# Patient Record
Sex: Male | Born: 1980 | Race: Black or African American | Hispanic: No | Marital: Married | Smoking: Never smoker
Health system: Southern US, Community
[De-identification: ages and names within clinical notes are randomized; demographics above are authoritative.]

## PROBLEM LIST (undated history)

## (undated) DIAGNOSIS — K529 Noninfective gastroenteritis and colitis, unspecified: Secondary | ICD-10-CM

## (undated) DIAGNOSIS — I509 Heart failure, unspecified: Secondary | ICD-10-CM

## (undated) DIAGNOSIS — E119 Type 2 diabetes mellitus without complications: Secondary | ICD-10-CM

## (undated) DIAGNOSIS — E785 Hyperlipidemia, unspecified: Secondary | ICD-10-CM

## (undated) DIAGNOSIS — G4733 Obstructive sleep apnea (adult) (pediatric): Secondary | ICD-10-CM

## (undated) DIAGNOSIS — I10 Essential (primary) hypertension: Secondary | ICD-10-CM

## (undated) HISTORY — DX: Obstructive sleep apnea (adult) (pediatric): G47.33

## (undated) HISTORY — DX: Heart failure, unspecified: I50.9

## (undated) HISTORY — PX: OTHER SURGICAL HISTORY: SHX169

---

## 2015-08-28 ENCOUNTER — Encounter: Payer: Self-pay | Admitting: *Deleted

## 2015-08-28 ENCOUNTER — Ambulatory Visit
Admission: EM | Admit: 2015-08-28 | Discharge: 2015-08-28 | Disposition: A | Discharge status: Home / Self Care | Payer: Federal, State, Local not specified - PPO | Attending: Family Medicine | Admitting: Family Medicine

## 2015-08-28 DIAGNOSIS — IMO0001 Reserved for inherently not codable concepts without codable children: Secondary | ICD-10-CM

## 2015-08-28 DIAGNOSIS — L03011 Cellulitis of right finger: Secondary | ICD-10-CM

## 2015-08-28 HISTORY — DX: Type 2 diabetes mellitus without complications: E11.9

## 2015-08-28 HISTORY — DX: Essential (primary) hypertension: I10

## 2015-08-28 MED ORDER — SULFAMETHOXAZOLE-TRIMETHOPRIM 800-160 MG PO TABS: 1.0000 | ORAL_TABLET | Freq: Two times a day (BID) | ORAL | Status: DC

## 2015-08-28 MED ORDER — HYDROCODONE-ACETAMINOPHEN 5-325 MG PO TABS: 1.0000 | ORAL_TABLET | Freq: Three times a day (TID) | ORAL | Status: DC | PRN

## 2015-08-28 MED ORDER — MUPIROCIN 2 % EX OINT: 1.0000 "application " | TOPICAL_OINTMENT | Freq: Three times a day (TID) | CUTANEOUS | Status: DC

## 2015-08-28 NOTE — ED Notes (Signed)
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 Finger is swollen

## 2015-08-28 NOTE — Discharge Instructions (Signed)
Paronychia  Paronychia is an infection of the skin. It happens near a fingernail or toenail. It may cause pain and swelling around the nail. Usually, it is not serious and it clears up with treatment. HOME CARE  Soak the fingers or toes in warm water as told by your doctor. You may be told to do this for 20 minutes, 2-3 times a day.  Keep the area dry when you are not soaking it.  Take medicines only as told by your doctor.  If you were given an antibiotic medicine, finish all of it even if you start to feel better.  Keep the affected area clean.  Do not try to drain a fluid-filled bump yourself.  Wear rubber gloves when putting your hands in water.  Wear gloves if your hands might touch cleaners or chemicals.  Follow your doctor's instructions about:  Wound care.  Bandage (dressing) changes and removal. GET HELP IF:  Your symptoms get worse or do not improve.  You have a fever or chills.  You have redness spreading from the affected area.  You have more fluid, blood, or pus coming from the affected area.  Your finger or knuckle is swollen or is hard to move.   This information is not intended to replace advice given to you by your health care provider. Make sure you discuss any questions you have with your health care provider.   Document Released: 10/24/2009 Document Revised: 03/22/2015 Document Reviewed: 10/13/2014 Elsevier Interactive Patient Education [employer].

## 2015-08-28 NOTE — ED Provider Notes (Signed)
CSN: [id number]     Arrival date & time 08/28/15  1012 History   First MD Initiated Contact with Patient 08/28/15 1202     Nurses's notes were reviewed. Chief Complaint  Patient presents with  . Hand Pain     Patient second finger of the right hand became painful after going to the shooting range on Tuesday. States the fingers become worse  as the days have progressed. 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 And no history of infection of the hand or fingers previously.   Consider location/radiation/quality/duration/timing/severity/associated sxs/prior Treatment) Patient is a 33 y.o. male presenting with hand pain. The history is provided by the patient. No language interpreter was used.  Hand Pain This is a new problem. The current episode started more than 2 days ago. The problem occurs constantly. The problem has been gradually worsening. Pertinent negatives include no chest pain, no abdominal pain, no headaches and no shortness of breath. Exacerbated by: Using the finger. Nothing relieves the symptoms.    Past Medical History  Diagnosis Date  . Diabetes mellitus without complication ([place])   . Hypertension    History reviewed. No pertinent past surgical history. History reviewed. No pertinent family history. Social History  Substance Use Topics  . Smoking status: Never Smoker   . Smokeless tobacco: None  . Alcohol Use: 3.6 oz/week    6 Cans of beer per week    Review of Systems  Respiratory: Negative for shortness of breath.   Cardiovascular: Negative for chest pain.  Gastrointestinal: Negative for abdominal pain.  Neurological: Negative for headaches.  All other systems reviewed and are negative.    Patient does not smoke but he is a diabetic and has hypertension.  Allergies  Fish allergy  Home Medications   Prior to Admission medications   Medication Sig Start Date End Date Taking? Authorizing Provider    HYDROcodone-acetaminophen (NORCO) 5-325 MG tablet Take 1 tablet by mouth every 8 (eight) hours as needed for moderate pain. 08/28/15   Frederich Cha, MD  mupirocin ointment (BACTROBAN) 2 % Apply 1 application topically 3 (three) times daily. 08/28/15   Frederich Cha, MD  sulfamethoxazole-trimethoprim (BACTRIM DS,SEPTRA DS) 800-160 MG tablet Take 1 tablet by mouth 2 (two) times daily. 08/28/15   Frederich Cha, MD   Meds Ordered and Administered this Visit  Medications - No data to display  BP 147/95 mmHg  Pulse 75  Temp(Src) 97.9 F (36.6 C) (Oral)  Ht 5' 9"  (1.753 m)  Wt 348 lb (157.852 kg)  BMI 51.37 kg/m2  SpO2 97% No data found.   Physical Exam  Constitutional: He is oriented to person, place, and time. He appears well-developed and well-nourished.  HENT:  Head: Normocephalic and atraumatic.  Eyes: Pupils are equal, round, and reactive to light.  Neck: Neck supple.  Musculoskeletal: Normal range of motion.       Hands: Neurological: He is alert and oriented to person, place, and time.  Skin: Skin is warm and dry.  Psychiatric: His behavior is normal.  Vitals reviewed.  he has a paronychial infection of the right second finger.  ED Course  .Marland KitchenIncision and Drainage Date/Time: 08/28/2015 12:27 PM Performed by: Frederich Cha Authorized by: Frederich Cha Consent: Verbal consent obtained. Written consent obtained. Consent given by: patient Patient identity confirmed: verbally with patient Type: abscess Body area: upper extremity Location details: right index finger Anesthesia: local infiltration Local anesthetic: lidocaine 1% without epinephrine Patient sedated:  no Scalpel size: 11 Incision type: single straight Incision depth: subcutaneous Complexity: simple Drainage: purulent Drainage amount: moderate Wound treatment: wound left open Patient tolerance: Patient tolerated the procedure well with no immediate complications   (including critical care time)  Labs Review Labs  Reviewed  CULTURE, ROUTINE-ABSCESS    Imaging Review No results found.   Visual Acuity Review  Right Eye Distance:   Left Eye Distance:   Bilateral Distance:    Right Eye Near:   Left Eye Near:    Bilateral Near:         MDM   1. Paronychia of second finger, right      patient has improvement after the procedure. We'll bandage the finger place on Septra DS twice a day for 10 days back pain ointment up to 3 times a day Norco for pain as needed. Return for follow-up when necessary. Will allow him to return to work on Tuesday  Frederich Cha, MD 08/28/15 1229

## 2015-08-31 LAB — CULTURE, ROUTINE-ABSCESS

## 2016-01-20 ENCOUNTER — Ambulatory Visit
Admission: EM | Admit: 2016-01-20 | Discharge: 2016-01-20 | Disposition: A | Discharge status: Home / Self Care | Payer: Federal, State, Local not specified - PPO | Attending: Family Medicine | Admitting: Family Medicine

## 2016-01-20 ENCOUNTER — Ambulatory Visit (INDEPENDENT_AMBULATORY_CARE_PROVIDER_SITE_OTHER): Payer: Federal, State, Local not specified - PPO

## 2016-01-20 DIAGNOSIS — M5416 Radiculopathy, lumbar region: Secondary | ICD-10-CM | POA: Diagnosis not present

## 2016-01-20 HISTORY — DX: Noninfective gastroenteritis and colitis, unspecified: K52.9

## 2016-01-20 MED ORDER — NAPROXEN 500 MG PO TABS: 500.0000 mg | ORAL_TABLET | Freq: Two times a day (BID) | ORAL | Status: DC

## 2016-01-20 MED ORDER — METAXALONE 800 MG PO TABS: 800.0000 mg | ORAL_TABLET | Freq: Three times a day (TID) | ORAL | Status: DC

## 2016-01-20 MED ORDER — KETOROLAC TROMETHAMINE 60 MG/2ML IM SOLN
60.0000 mg | Freq: Once | INTRAMUSCULAR | Status: AC
  Administered 2016-01-20: 60 mg via INTRAMUSCULAR

## 2016-01-20 NOTE — ED Notes (Addendum)
Patient c/o right-sided lower back pain going down his right leg which has been happening for 2 weeks now.  His pain is worse in the morning.  He does not remember the names of any of his medications today, but he does take medication for Diabetes and High Blood Pressure.

## 2016-01-20 NOTE — ED Provider Notes (Signed)
CSN: [id number]     Arrival date & time 01/20/16  6578 History   First MD Initiated Contact with Patient 01/20/16 [phone]     Chief Complaint  Patient presents with  . Back Pain    Right Sided Lower Back Pain going down leg   (Consider location/radiation/quality/duration/timing/severity/associated sxs/prior Treatment) HPI  This a 35 year old male who presents with a two-week history of nonradiating low back pain that it's in his right sided lower lumbar region into his right buttocks posterior thigh and lateral calf. Is mostly pain that he feels and there is no numbness or tingling associated. He does not remember any specific injury but does remember sleeping in a very thin mattress 2 weeks ago and awoke with the onset of this pain. Has persisted despite the use of over-the-counter medications including ibuprofen BenGay and icy hot. He states that his most painful when he is straining at bowel movement but coughing or sneezing does not seem to exacerbate it. He has had previous back pains in the past but nothing this severe.  Past Medical History  Diagnosis Date  . Diabetes mellitus without complication ([place])   . Hypertension   . Colitis    History reviewed. No pertinent past surgical history. Family History  Problem Relation Age of Onset  . Hypertension Mother    Social History  Substance Use Topics  . Smoking status: Never Smoker   . Smokeless tobacco: None  . Alcohol Use: 3.6 oz/week    6 Cans of beer per week    Review of Systems  Constitutional: Negative for fever, chills, activity change and fatigue.  HENT: Negative for congestion.   All other systems reviewed and are negative.   Allergies  Fish allergy  Home Medications   Prior to Admission medications   Medication Sig Start Date End Date Taking? Authorizing Provider  HYDROcodone-acetaminophen (NORCO) 5-325 MG tablet Take 1 tablet by mouth every 8 (eight) hours as needed for moderate pain. 08/28/15   Frederich Cha, MD   metaxalone (SKELAXIN) 800 MG tablet Take 1 tablet (800 mg total) by mouth 3 (three) times daily. 01/20/16   Lorin Picket, PA-C  mupirocin ointment (BACTROBAN) 2 % Apply 1 application topically 3 (three) times daily. 08/28/15   Frederich Cha, MD  naproxen (NAPROSYN) 500 MG tablet Take 1 tablet (500 mg total) by mouth 2 (two) times daily with a meal. 01/20/16   Lorin Picket, PA-C  sulfamethoxazole-trimethoprim (BACTRIM DS,SEPTRA DS) 800-160 MG tablet Take 1 tablet by mouth 2 (two) times daily. 08/28/15   Frederich Cha, MD   Meds Ordered and Administered this Visit   Medications  ketorolac (TORADOL) injection 60 mg (60 mg Intramuscular Given 01/20/16 1002)    BP 150/104 mmHg  Pulse 74  Temp(Src) 98 F (36.7 C) (Oral)  Resp 17  Ht 5' 9"  (1.753 m)  Wt 345 lb (156.491 kg)  BMI 50.92 kg/m2  SpO2 99% No data found.   Physical Exam  Constitutional: He appears well-developed and well-nourished. No distress.  HENT:  Head: Normocephalic and atraumatic.  Eyes: Conjunctivae are normal. Pupils are equal, round, and reactive to light.  Neck: Normal range of motion. Neck supple.  Musculoskeletal:  Examination of the lumbar spine is the patient able to forward flex with a definite right sided takeoff. Is able to bend to the level of his hands at his ankle level. Right lateral flexion is decreased and painful for the patient. Is able to toe and heel walk without difficulty. Deep  tendon reflexes are 1- 2 over 4 and bilaterally symmetrical. EHL anterior tibialis and peroneal are strong to clinical testing. Her leg raise testing is positive on the right at approximately 75-80 with low back pain and lateral calf pain. Cross positive component on the left at 80 with the low back pain.  Skin: He is not diaphoretic.  Vitals reviewed.   ED Course  Procedures (including critical care time)  Labs Review Labs Reviewed - No data to display  Imaging Review Dg Lumbar Spine Complete  01/20/2016  CLINICAL  DATA:  Lumbago with right-sided radicular symptoms. No history of trauma EXAM: LUMBAR SPINE - COMPLETE 4+ VIEW COMPARISON:  None. FINDINGS: Frontal, lateral, spot lumbosacral lateral, and bilateral oblique views were obtained. There are 5 non-rib-bearing lumbar type vertebral bodies. There is no fracture or spondylolisthesis. There is mild disc space narrowing at L4-5 and L5-S1. Other disc spaces appear unremarkable. There is no appreciable facet arthropathy. IMPRESSION: Mild disc space narrowing L4-5 and L5-S1. No fracture or spondylolisthesis. Electronically Signed   By: Lowella Grip III M.D.   On: 01/20/2016 10:38     Visual Acuity Review  Right Eye Distance:   Left Eye Distance:   Bilateral Distance:    Right Eye Near:   Left Eye Near:    Bilateral Near:     10:02 Medication Given MA  ketorolac (TORADOL) injection 60 mg - Dose: 60 mg ; Route: Intramuscular ; Site: Right Ventrogluteal ; Scheduled Time: 10         MDM   1. Right lumbar radiculopathy    Discharge Medication List as of 01/20/2016 11:11 AM    START taking these medications   Details  metaxalone (SKELAXIN) 800 MG tablet Take 1 tablet (800 mg total) by mouth 3 (three) times daily., Starting 01/20/2016, Until Discontinued, Normal    naproxen (NAPROSYN) 500 MG tablet Take 1 tablet (500 mg total) by mouth 2 (two) times daily with a meal., Starting 01/20/2016, Until Discontinued, Normal      Plan: 1. Test/x-ray results and diagnosis reviewed with patient 2. rx as per orders; risks, benefits, potential side effects reviewed with patient 3. Recommend supportive treatment with symptom avoidance and rest as necessary he is cautioned regarding the use of Skelaxin with activities require concentration and judgment. He is not improving in a week or 2 recommended that he see his, primary care physician for possible enrollment in a therapy program.  4. F/u prn if symptoms worsen or don't improve     Lorin Picket,  PA-C 01/20/16 1445

## 2016-01-20 NOTE — Discharge Instructions (Signed)
Lumbosacral Radiculopathy Lumbosacral radiculopathy is a condition that involves the spinal nerves and nerve roots in the low back and bottom of the spine. The condition develops when these nerves and nerve roots move out of place or become inflamed and cause symptoms. CAUSES This condition may be caused by:  Pressure from a disk that bulges out of place (herniated disk). A disk is a plate of cartilage that separates bones in the spine.  Disk degeneration.  A narrowing of the bones of the lower back (spinal stenosis).  A tumor.  An infection.  An injury that places sudden pressure on the disks that cushion the bones of your lower spine. RISK FACTORS This condition is more likely to develop in:  Males aged 30-50 years.  Females aged 33-60 years.  People who lift improperly.  People who are overweight or live a sedentary lifestyle.  People who smoke.  People who perform repetitive activities that strain the spine. SYMPTOMS Symptoms of this condition include:  Pain that goes down from the back into the legs (sciatica). This is the most common symptom. The pain may be worse with sitting, coughing, or sneezing.  Pain and numbness in the arms and legs.  Muscle weakness.  Tingling.  Loss of bladder control or bowel control. DIAGNOSIS This condition is diagnosed with a physical exam and medical history. If the pain is lasting, you may have tests, such as:  MRI scan.  X-ray.  CT scan.  Myelogram.  Nerve conduction study. TREATMENT This condition is often treated with:  Hot packs and ice applied to affected areas.  Stretches to improve flexibility.  Exercises to strengthen back muscles.  Physical therapy.  Pain medicine.  A steroid injection in the spine. In some cases, no treatment is needed. If the condition is long-lasting (chronic), or if symptoms are severe, treatment may involve surgery or lifestyle changes, such as following a weight loss plan. HOME  CARE INSTRUCTIONS Medicines  Take medicines only as directed by your health care provider.  Do not drive or operate heavy machinery while taking pain medicine. Injury Care  Apply a heat pack to the injured area as directed by your health care provider.  Apply ice to the affected area:  Put ice in a plastic bag.  Place a towel between your skin and the bag.  Leave the ice on for 20-30 minutes, every 2 hours while you are awake or as needed. Or, leave the ice on for as long as directed by your health care provider. Other Instructions  If you were shown how to do any exercises or stretches, do them as directed by your health care provider.  If your health care provider prescribed a diet or exercise program, follow it as directed.  Keep all follow-up visits as directed by your health care provider. This is important. SEEK MEDICAL CARE IF:  Your pain does not improve over time even when taking pain medicines. SEEK IMMEDIATE MEDICAL CARE IF:  Your develop severe pain.  Your pain suddenly gets worse.  You develop increasing weakness in your legs.  You lose the ability to control your bladder or bowel.  You have difficulty walking or balancing.  You have a fever.   This information is not intended to replace advice given to you by your health care provider. Make sure you discuss any questions you have with your health care provider.   Document Released: 11/05/2005 Document Revised: 03/22/2015 Document Reviewed: 11/01/2014 Elsevier Interactive Patient Education [employer].

## 2016-01-26 ENCOUNTER — Ambulatory Visit
Admission: RE | Admit: 2016-01-26 | Discharge: 2016-01-26 | Disposition: A | Discharge status: Home / Self Care | Payer: Federal, State, Local not specified - PPO | Source: Ambulatory Visit | Attending: Physician Assistant | Admitting: Physician Assistant

## 2016-01-26 ENCOUNTER — Other Ambulatory Visit: Payer: Self-pay | Admitting: Physician Assistant

## 2016-01-26 DIAGNOSIS — M5136 Other intervertebral disc degeneration, lumbar region: Secondary | ICD-10-CM | POA: Insufficient documentation

## 2016-01-26 DIAGNOSIS — M5116 Intervertebral disc disorders with radiculopathy, lumbar region: Secondary | ICD-10-CM | POA: Insufficient documentation

## 2017-02-08 IMAGING — CR DG LUMBAR SPINE COMPLETE 4+V
6 series · 6 of 6 positions shown · non-contrast
Comparison: None.

CLINICAL DATA: Lumbago with right-sided radicular symptoms. No
history of trauma

EXAM:
LUMBAR SPINE - COMPLETE 4+ VIEW

[l-spine ap]
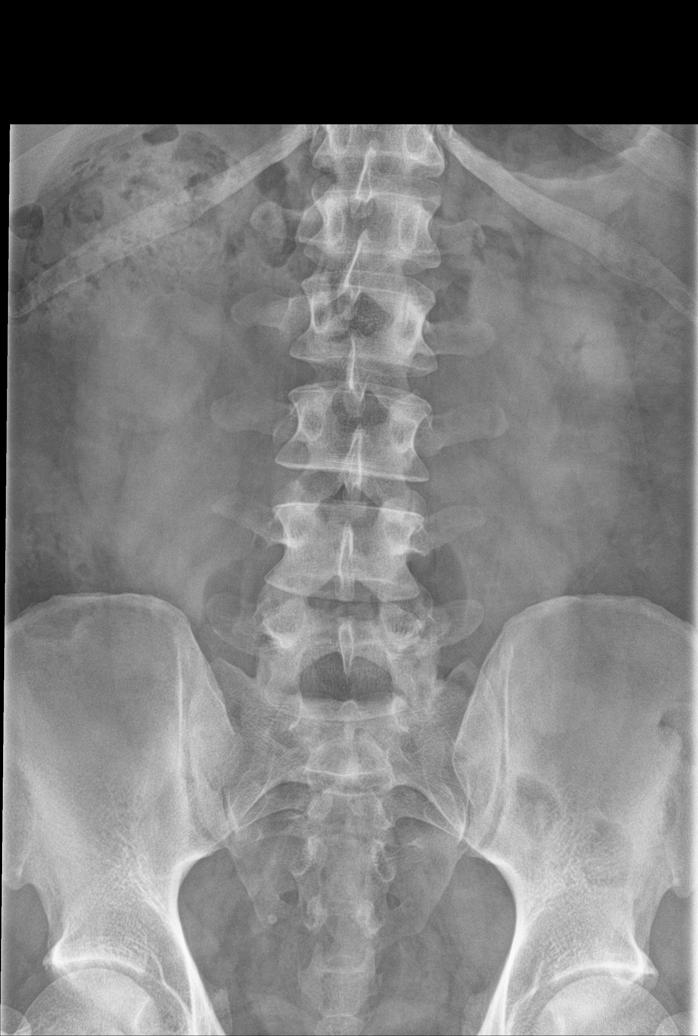

[l-spine obl (1 of 2)]
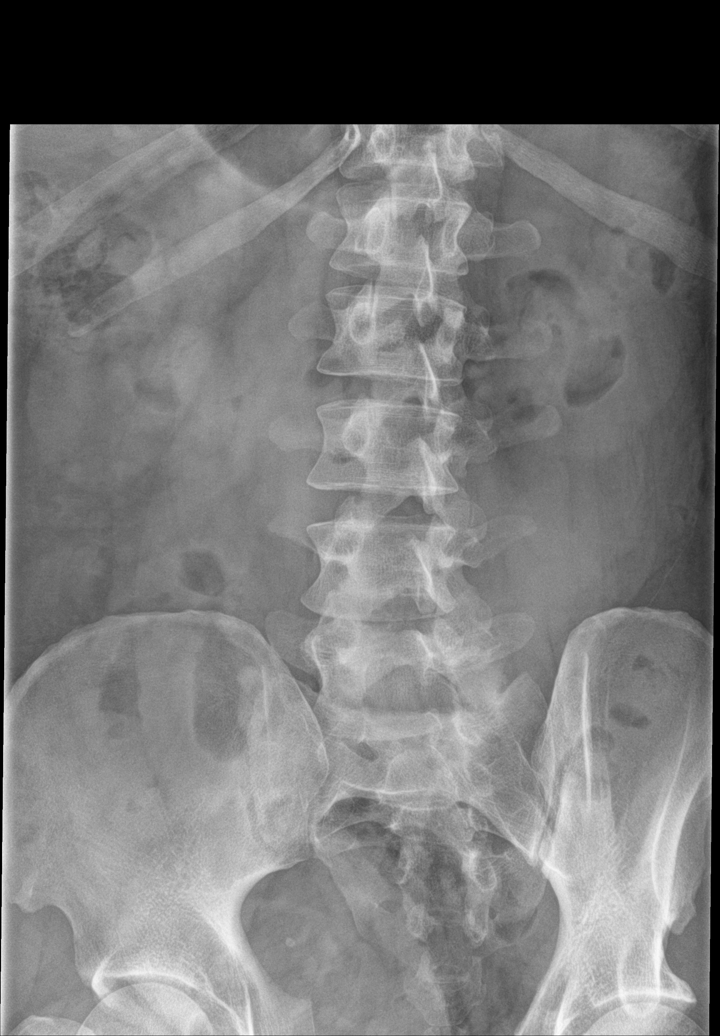

[l-spine obl (2 of 2)]
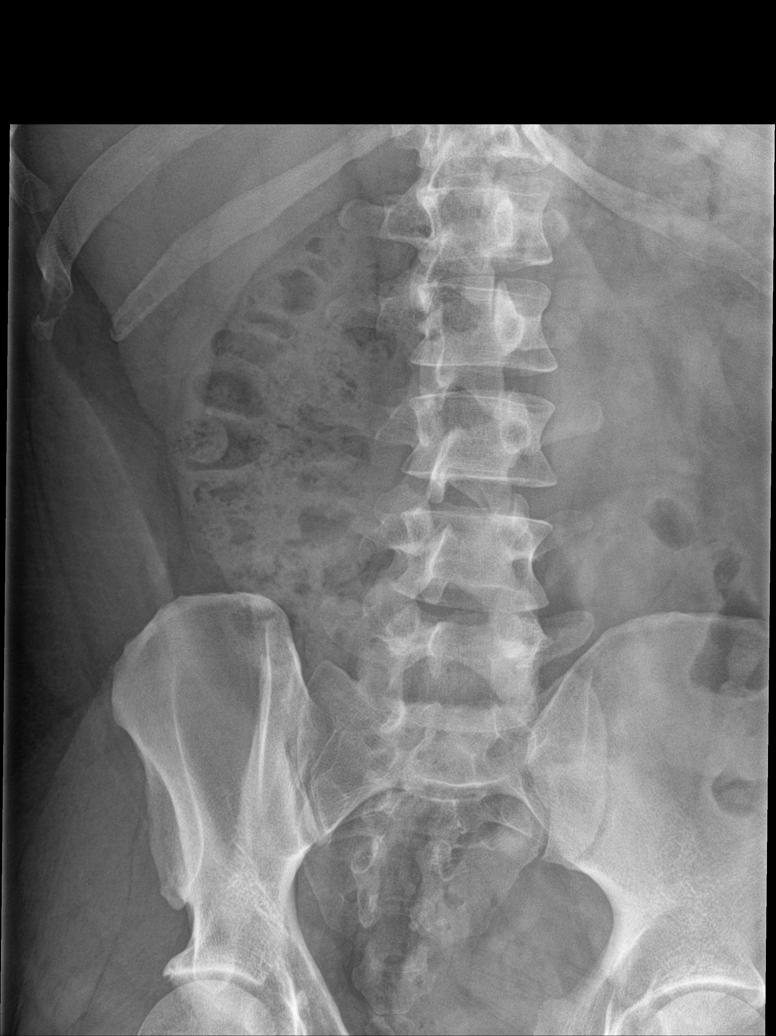

[l-spine lat]
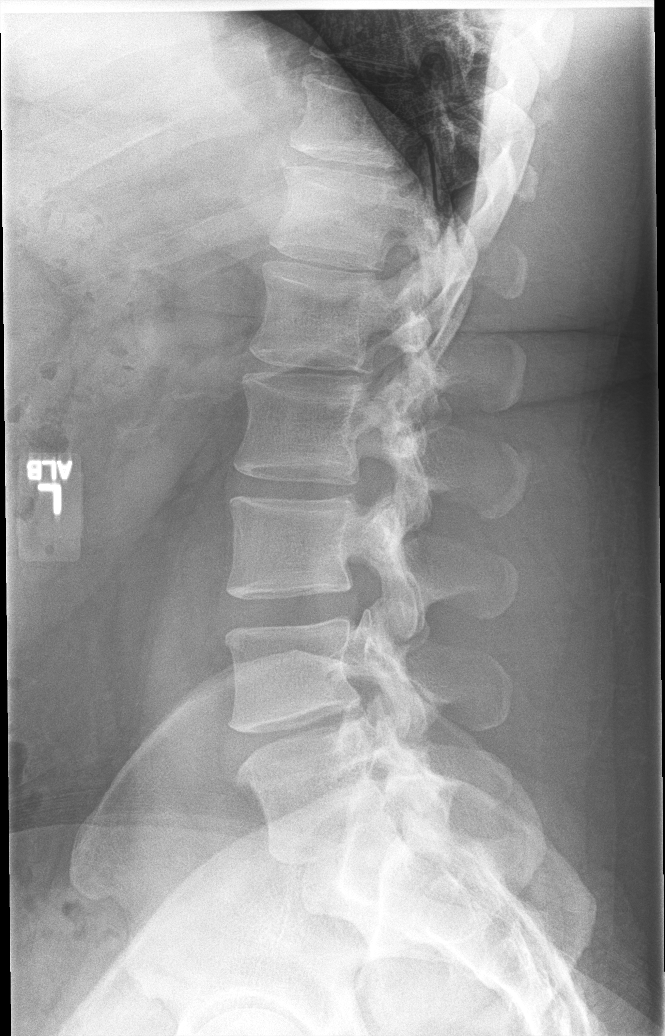

[l-spine spot (1 of 2)]
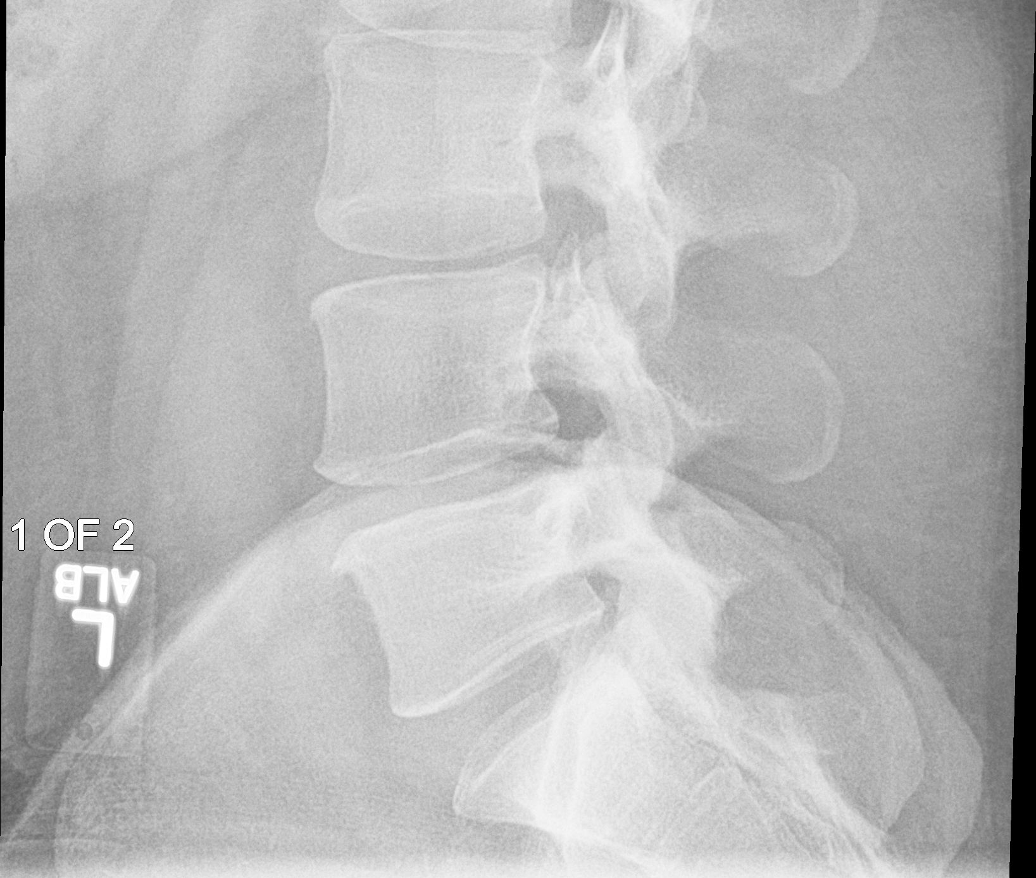

[l-spine spot (2 of 2)]
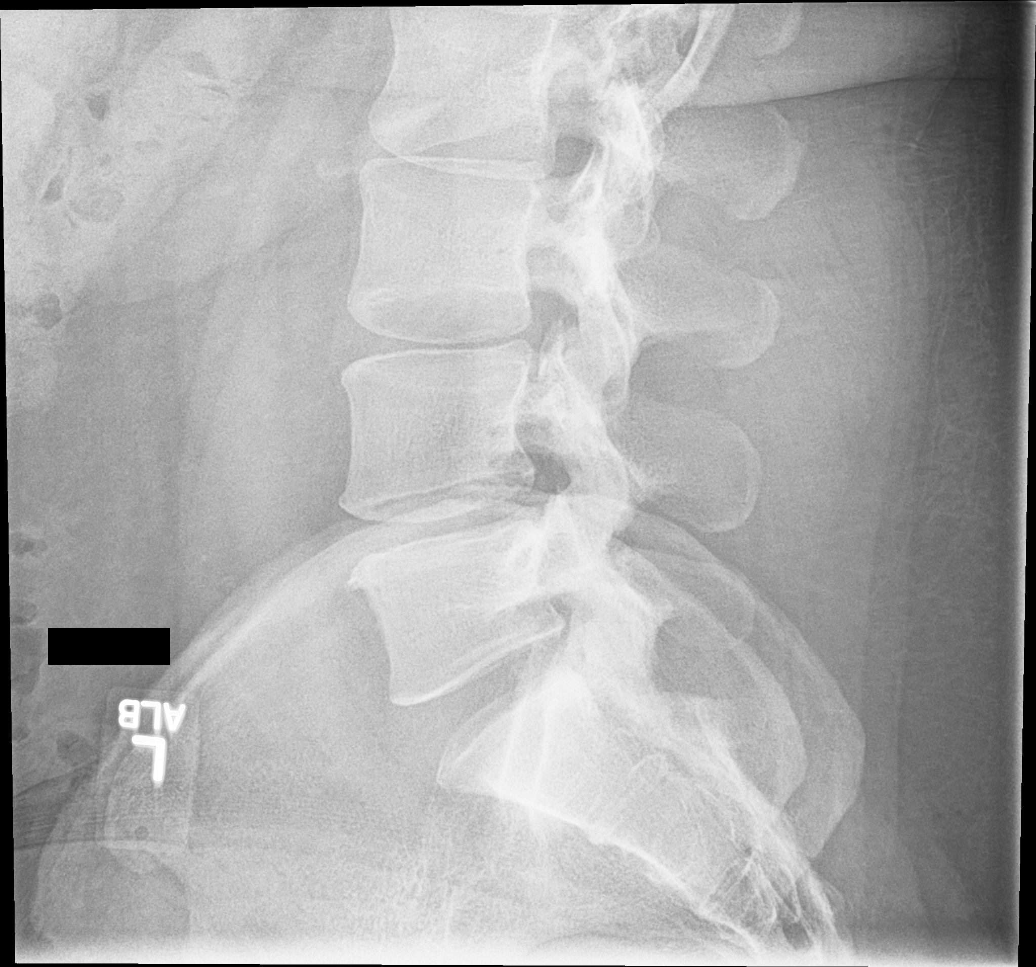

[6 of 6 positions shown; findings below may reference images not displayed]

FINDINGS: Frontal, lateral, spot lumbosacral lateral, and bilateral oblique
views were obtained. There are 5 non-rib-bearing lumbar type
vertebral bodies. There is no fracture or spondylolisthesis. There
is mild disc space narrowing at L4-5 and L5-S1. Other disc spaces
appear unremarkable. There is no appreciable facet arthropathy.
IMPRESSION: Mild disc space narrowing L4-5 and L5-S1. No fracture or
spondylolisthesis.

## 2018-02-20 DIAGNOSIS — E785 Hyperlipidemia, unspecified: Secondary | ICD-10-CM

## 2018-02-20 DIAGNOSIS — E1169 Type 2 diabetes mellitus with other specified complication: Secondary | ICD-10-CM | POA: Insufficient documentation

## 2019-02-02 ENCOUNTER — Emergency Department: Payer: Federal, State, Local not specified - PPO

## 2019-02-02 ENCOUNTER — Encounter: Payer: Self-pay | Admitting: Emergency Medicine

## 2019-02-02 ENCOUNTER — Other Ambulatory Visit: Payer: Self-pay

## 2019-02-02 ENCOUNTER — Emergency Department
Admission: EM | Admit: 2019-02-02 | Discharge: 2019-02-02 | Disposition: A | Discharge status: Home / Self Care | Payer: Federal, State, Local not specified - PPO | Attending: Emergency Medicine | Admitting: Emergency Medicine

## 2019-02-02 DIAGNOSIS — E119 Type 2 diabetes mellitus without complications: Secondary | ICD-10-CM | POA: Diagnosis not present

## 2019-02-02 DIAGNOSIS — R6884 Jaw pain: Secondary | ICD-10-CM | POA: Diagnosis not present

## 2019-02-02 DIAGNOSIS — Z79899 Other long term (current) drug therapy: Secondary | ICD-10-CM | POA: Insufficient documentation

## 2019-02-02 DIAGNOSIS — I10 Essential (primary) hypertension: Secondary | ICD-10-CM | POA: Diagnosis not present

## 2019-02-02 DIAGNOSIS — Z7984 Long term (current) use of oral hypoglycemic drugs: Secondary | ICD-10-CM | POA: Diagnosis not present

## 2019-02-02 MED ORDER — TRAMADOL HCL 50 MG PO TABS: 50.0000 mg | ORAL_TABLET | Freq: Four times a day (QID) | ORAL | 0 refills | Status: DC | PRN

## 2019-02-02 MED ORDER — OXYCODONE-ACETAMINOPHEN 5-325 MG PO TABS: 1.0000 | ORAL_TABLET | Freq: Once | ORAL | Status: DC
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
## 2019-02-02 NOTE — ED Notes (Signed)
Pt still has not returned to room. Dr. Joni Fears made aware.

## 2019-02-02 NOTE — ED Notes (Signed)
Pt stated that he was going outside to get his wife.
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
## 2019-02-22 ENCOUNTER — Inpatient Hospital Stay
Admission: EM | Admit: 2019-02-22 | Discharge: 2019-03-01 | DRG: 177 | Disposition: A | Discharge status: Home Health | Payer: Federal, State, Local not specified - PPO | Attending: Internal Medicine | Admitting: Internal Medicine

## 2019-02-22 ENCOUNTER — Emergency Department: Payer: Federal, State, Local not specified - PPO

## 2019-02-22 ENCOUNTER — Encounter: Payer: Self-pay | Admitting: Intensive Care

## 2019-02-22 ENCOUNTER — Other Ambulatory Visit: Payer: Self-pay

## 2019-02-22 DIAGNOSIS — Z6841 Body Mass Index (BMI) 40.0 and over, adult: Secondary | ICD-10-CM

## 2019-02-22 DIAGNOSIS — Z7984 Long term (current) use of oral hypoglycemic drugs: Secondary | ICD-10-CM | POA: Diagnosis not present

## 2019-02-22 DIAGNOSIS — R0902 Hypoxemia: Secondary | ICD-10-CM | POA: Diagnosis present

## 2019-02-22 DIAGNOSIS — E11649 Type 2 diabetes mellitus with hypoglycemia without coma: Secondary | ICD-10-CM | POA: Diagnosis present

## 2019-02-22 DIAGNOSIS — N183 Chronic kidney disease, stage 3 (moderate): Secondary | ICD-10-CM | POA: Diagnosis present

## 2019-02-22 DIAGNOSIS — E785 Hyperlipidemia, unspecified: Secondary | ICD-10-CM | POA: Diagnosis present

## 2019-02-22 DIAGNOSIS — Z79899 Other long term (current) drug therapy: Secondary | ICD-10-CM | POA: Diagnosis not present

## 2019-02-22 DIAGNOSIS — J9600 Acute respiratory failure, unspecified whether with hypoxia or hypercapnia: Secondary | ICD-10-CM

## 2019-02-22 DIAGNOSIS — I10 Essential (primary) hypertension: Secondary | ICD-10-CM | POA: Diagnosis not present

## 2019-02-22 DIAGNOSIS — J1289 Other viral pneumonia: Secondary | ICD-10-CM | POA: Diagnosis present

## 2019-02-22 DIAGNOSIS — J988 Other specified respiratory disorders: Secondary | ICD-10-CM | POA: Diagnosis not present

## 2019-02-22 DIAGNOSIS — Q382 Macroglossia: Secondary | ICD-10-CM | POA: Diagnosis not present

## 2019-02-22 DIAGNOSIS — Z8249 Family history of ischemic heart disease and other diseases of the circulatory system: Secondary | ICD-10-CM

## 2019-02-22 DIAGNOSIS — E1122 Type 2 diabetes mellitus with diabetic chronic kidney disease: Secondary | ICD-10-CM | POA: Diagnosis present

## 2019-02-22 DIAGNOSIS — R439 Unspecified disturbances of smell and taste: Secondary | ICD-10-CM | POA: Diagnosis present

## 2019-02-22 DIAGNOSIS — G4733 Obstructive sleep apnea (adult) (pediatric): Secondary | ICD-10-CM | POA: Diagnosis present

## 2019-02-22 DIAGNOSIS — K529 Noninfective gastroenteritis and colitis, unspecified: Secondary | ICD-10-CM | POA: Diagnosis present

## 2019-02-22 DIAGNOSIS — Z91013 Allergy to seafood: Secondary | ICD-10-CM

## 2019-02-22 DIAGNOSIS — I129 Hypertensive chronic kidney disease with stage 1 through stage 4 chronic kidney disease, or unspecified chronic kidney disease: Secondary | ICD-10-CM | POA: Diagnosis present

## 2019-02-22 DIAGNOSIS — J9690 Respiratory failure, unspecified, unspecified whether with hypoxia or hypercapnia: Secondary | ICD-10-CM

## 2019-02-22 DIAGNOSIS — J189 Pneumonia, unspecified organism: Secondary | ICD-10-CM

## 2019-02-22 DIAGNOSIS — Z20828 Contact with and (suspected) exposure to other viral communicable diseases: Secondary | ICD-10-CM | POA: Diagnosis not present

## 2019-02-22 DIAGNOSIS — E119 Type 2 diabetes mellitus without complications: Secondary | ICD-10-CM | POA: Diagnosis not present

## 2019-02-22 DIAGNOSIS — J9601 Acute respiratory failure with hypoxia: Secondary | ICD-10-CM | POA: Diagnosis present

## 2019-02-22 DIAGNOSIS — E871 Hypo-osmolality and hyponatremia: Secondary | ICD-10-CM | POA: Diagnosis present

## 2019-02-22 DIAGNOSIS — R6889 Other general symptoms and signs: Secondary | ICD-10-CM | POA: Diagnosis not present

## 2019-02-22 DIAGNOSIS — R918 Other nonspecific abnormal finding of lung field: Secondary | ICD-10-CM | POA: Diagnosis not present

## 2019-02-22 HISTORY — DX: Hyperlipidemia, unspecified: E78.5

## 2019-02-22 LAB — BASIC METABOLIC PANEL
Anion gap: 9 (ref 5–15)
BUN: 19 mg/dL (ref 6–20)
CO2: 25 mmol/L (ref 22–32)
Calcium: 8.4 mg/dL — ABNORMAL LOW (ref 8.9–10.3)
Chloride: 98 mmol/L (ref 98–111)
Creatinine, Ser: 1.69 mg/dL — ABNORMAL HIGH (ref 0.61–1.24)
GFR calc Af Amer: 58 mL/min — ABNORMAL LOW (ref >60)
GFR calc non Af Amer: 50 mL/min — ABNORMAL LOW (ref >60)
Glucose, Bld: 249 mg/dL — ABNORMAL HIGH (ref 70–99)
Potassium: 3.9 mmol/L (ref 3.5–5.1)
Sodium: 132 mmol/L — ABNORMAL LOW (ref 135–145)

## 2019-02-22 LAB — INFLUENZA PANEL BY PCR (TYPE A & B)
Influenza A By PCR: NEGATIVE
Influenza B By PCR: NEGATIVE

## 2019-02-22 LAB — CBC WITH DIFFERENTIAL/PLATELET
Abs Immature Granulocytes: 0.01 10*3/uL (ref 0.00–0.07)
Basophils Absolute: 0 10*3/uL (ref 0.0–0.1)
Basophils Relative: 0 %
Eosinophils Absolute: 0 10*3/uL (ref 0.0–0.5)
Eosinophils Relative: 0 %
HCT: 41.3 % (ref 39.0–52.0)
Hemoglobin: 13.9 g/dL (ref 13.0–17.0)
Immature Granulocytes: 0 %
Lymphocytes Relative: 29 %
Lymphs Abs: 1.3 10*3/uL (ref 0.7–4.0)
MCH: 30 pg (ref 26.0–34.0)
MCHC: 33.7 g/dL (ref 30.0–36.0)
MCV: 89 fL (ref 80.0–100.0)
Monocytes Absolute: 0.3 10*3/uL (ref 0.1–1.0)
Monocytes Relative: 6 %
Neutro Abs: 3 10*3/uL (ref 1.7–7.7)
Neutrophils Relative %: 65 %
Platelets: 184 10*3/uL (ref 150–400)
RBC: 4.64 MIL/uL (ref 4.22–5.81)
RDW: 14.3 % (ref 11.5–15.5)
Smear Review: UNDETERMINED
WBC: 4.7 10*3/uL (ref 4.0–10.5)
nRBC: 0 % (ref 0.0–0.2)

## 2019-02-22 LAB — GLUCOSE, CAPILLARY
Glucose-Capillary: 179 mg/dL — ABNORMAL HIGH (ref 70–99)
Glucose-Capillary: 120 mg/dL — ABNORMAL HIGH (ref 70–99)

## 2019-02-22 LAB — RESPIRATORY PANEL BY PCR

## 2019-02-22 LAB — BRAIN NATRIURETIC PEPTIDE: B Natriuretic Peptide: 17 pg/mL (ref 0.0–100.0)

## 2019-02-22 MED ORDER — SODIUM CHLORIDE 0.9 % IV SOLN
INTRAVENOUS | Status: DC
  Administered 2019-02-22 – 2019-02-24 (×4): via INTRAVENOUS

## 2019-02-22 MED ORDER — INSULIN ASPART 100 UNIT/ML ~~LOC~~ SOLN
0.0000 [IU] | Freq: Three times a day (TID) | SUBCUTANEOUS | Status: DC
  Administered 2019-02-22: 17:00:00 3 [IU] via SUBCUTANEOUS
  Administered 2019-02-23: 2 [IU] via SUBCUTANEOUS
  Administered 2019-02-25: 13:00:00 3 [IU] via SUBCUTANEOUS
  Administered 2019-02-25: 5 [IU] via SUBCUTANEOUS
  Administered 2019-02-26: 12:00:00 3 [IU] via SUBCUTANEOUS
  Administered 2019-02-26: 09:00:00 5 [IU] via SUBCUTANEOUS
  Administered 2019-02-26: 16:00:00 3 [IU] via SUBCUTANEOUS
  Administered 2019-02-27: 5 [IU] via SUBCUTANEOUS
  Administered 2019-02-27: 16:00:00 8 [IU] via SUBCUTANEOUS
  Administered 2019-02-27 – 2019-03-01 (×5): 3 [IU] via SUBCUTANEOUS
  Filled 2019-02-22 (×13): qty 1

## 2019-02-22 MED ORDER — INSULIN ASPART 100 UNIT/ML ~~LOC~~ SOLN
0.0000 [IU] | Freq: Every day | SUBCUTANEOUS | Status: DC
  Administered 2019-02-25: 21:00:00 3 [IU] via SUBCUTANEOUS
  Administered 2019-02-26: 23:00:00 2 [IU] via SUBCUTANEOUS
  Filled 2019-02-22 (×3): qty 1

## 2019-02-22 MED ORDER — ONDANSETRON HCL 4 MG/2ML IJ SOLN: 4.0000 mg | Freq: Four times a day (QID) | INTRAMUSCULAR | Status: DC | PRN

## 2019-02-22 MED ORDER — LISINOPRIL 5 MG PO TABS
10.0000 mg | ORAL_TABLET | Freq: Every day | ORAL | Status: DC
  Administered 2019-02-23 – 2019-02-24 (×2): 10 mg via ORAL
  Filled 2019-02-22 (×2): qty 1

## 2019-02-22 MED ORDER — SODIUM CHLORIDE 0.9 % IV SOLN
500.0000 mg | INTRAVENOUS | Status: DC
  Administered 2019-02-22 – 2019-02-24 (×3): 500 mg via INTRAVENOUS
  Filled 2019-02-22 (×3): qty 500

## 2019-02-22 MED ORDER — ENOXAPARIN SODIUM 40 MG/0.4ML ~~LOC~~ SOLN
40.0000 mg | Freq: Two times a day (BID) | SUBCUTANEOUS | Status: DC
  Administered 2019-02-23 – 2019-03-01 (×11): 40 mg via SUBCUTANEOUS
  Filled 2019-02-22 (×13): qty 0.4

## 2019-02-22 MED ORDER — ONDANSETRON HCL 4 MG PO TABS: 4.0000 mg | ORAL_TABLET | Freq: Four times a day (QID) | ORAL | Status: DC | PRN

## 2019-02-22 MED ORDER — ATORVASTATIN CALCIUM 20 MG PO TABS
20.0000 mg | ORAL_TABLET | Freq: Every day | ORAL | Status: DC
  Administered 2019-02-22 – 2019-02-28 (×7): 20 mg via ORAL
  Filled 2019-02-22 (×7): qty 1

## 2019-02-22 MED ORDER — ACETAMINOPHEN 325 MG PO TABS
650.0000 mg | ORAL_TABLET | Freq: Four times a day (QID) | ORAL | Status: DC | PRN
  Administered 2019-02-23 (×2): 650 mg via ORAL
  Filled 2019-02-22 (×2): qty 2

## 2019-02-22 MED ORDER — ACETAMINOPHEN 650 MG RE SUPP: 650.0000 mg | Freq: Four times a day (QID) | RECTAL | Status: DC | PRN

## 2019-02-22 MED ORDER — SENNOSIDES-DOCUSATE SODIUM 8.6-50 MG PO TABS: 1.0000 | ORAL_TABLET | Freq: Every evening | ORAL | Status: DC | PRN

## 2019-02-22 MED ORDER — SODIUM CHLORIDE 0.9 % IV SOLN
1.0000 g | INTRAVENOUS | Status: DC
  Administered 2019-02-22 – 2019-02-27 (×6): 1 g via INTRAVENOUS
  Filled 2019-02-22: qty 1
  Filled 2019-02-22: qty 10
  Filled 2019-02-22: qty 1
  Filled 2019-02-22: qty 10
  Filled 2019-02-22 (×2): qty 1

## 2019-02-22 MED ORDER — METFORMIN HCL ER 500 MG PO TB24
1000.0000 mg | ORAL_TABLET | Freq: Two times a day (BID) | ORAL | Status: DC
  Administered 2019-02-23: 09:00:00 1000 mg via ORAL
  Filled 2019-02-22 (×5): qty 2

## 2019-02-22 MED ORDER — METOPROLOL SUCCINATE ER 100 MG PO TB24
100.0000 mg | ORAL_TABLET | Freq: Every day | ORAL | Status: DC
  Administered 2019-02-23 – 2019-03-01 (×7): 100 mg via ORAL
  Filled 2019-02-22 (×2): qty 2
  Filled 2019-02-22 (×2): qty 1
  Filled 2019-02-22 (×2): qty 2
  Filled 2019-02-22: qty 1

## 2019-02-22 MED ORDER — GLIPIZIDE ER 10 MG PO TB24
10.0000 mg | ORAL_TABLET | Freq: Two times a day (BID) | ORAL | Status: DC
  Administered 2019-02-22 – 2019-02-23 (×2): 10 mg via ORAL
  Filled 2019-02-22 (×6): qty 1

## 2019-02-22 MED ORDER — ENOXAPARIN SODIUM 40 MG/0.4ML ~~LOC~~ SOLN
40.0000 mg | SUBCUTANEOUS | Status: DC
  Administered 2019-02-22: 21:00:00 40 mg via SUBCUTANEOUS
  Filled 2019-02-22: qty 0.4

## 2019-02-22 MED ORDER — AMLODIPINE BESYLATE 10 MG PO TABS
10.0000 mg | ORAL_TABLET | Freq: Every day | ORAL | Status: DC
  Administered 2019-02-23 – 2019-02-24 (×2): 10 mg via ORAL
  Filled 2019-02-22 (×2): qty 1

## 2019-02-22 NOTE — ED Triage Notes (Signed)
Patient arrived by EMS from [facility] for SOB, fever, non productive cough, and diarrhea X1 week. 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 [facility] reported room air sats of 81%

## 2019-02-22 NOTE — Progress Notes (Signed)
PHARMACIST - PHYSICIAN COMMUNICATION  CONCERNING:  Enoxaparin (Lovenox) for DVT Prophylaxis    RECOMMENDATION: Patient was prescribed enoxaprin 51m q24 hours for VTE prophylaxis.   Filed Weights   02/22/19 1055  Weight: (!) 360 lb (163.3 kg)    Body mass index is 51.65 kg/m.  Estimated Creatinine Clearance: 90.6 mL/min (A) (by C-G formula based on SCr of 1.69 mg/dL (H)).   [place] for enoxaparin 422mevery 12 hour dosing due to BMI being >40.  DESCRIPTION: [facility]  Patient is now receiving enoxaparin 4032mvery 12 hours.    KisOswald HillockharmD Clinical Pharmacist  02/22/2019 9:27 PM\
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
## 2019-02-22 NOTE — ED Notes (Signed)
Patient placed on 2L O2 at this time for O2 sats 90%

## 2019-02-22 NOTE — ED Notes (Signed)
Dr. Jimmye Norman on phone with pt updating him about admission.

## 2019-02-22 NOTE — ED Notes (Signed)
All lab redraws sent at this time.

## 2019-02-22 NOTE — ED Provider Notes (Signed)
[facility] Emergency Department Provider Note       Time seen: ----------------------------------------- 10:53 AM on 02/22/2019 -----------------------------------------   I have reviewed the triage vital signs and the nursing notes.  HISTORY   Chief Complaint Shortness of Breath    HPI Jordan Greer is a 38 y.o. male with a history of colitis, diabetes and hypertension who presents to the ED for shortness of breath.  Patient was sent from next care for same.  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  Next care reports room air oxygen saturation of 81%. He has had a fever over the last week with cough and diarrhea. He has noticed a change in his taste and smell.  Past Medical History:  Diagnosis Date  . Colitis   . Diabetes mellitus without complication ([place])   . Hypertension     There are no active problems to display for this patient.   No past surgical history on file.  Allergies Fish allergy  Social History Social History   Tobacco Use  . Smoking status: Never Smoker  . Smokeless tobacco: Never Used  Substance Use Topics  . Alcohol use: Yes    Alcohol/week: 6.0 standard drinks    Types: 6 Cans of beer per week  . Drug use: No   Review of Systems Constitutional: Positive for fevers, chills Cardiovascular: Negative for chest pain. Respiratory: Positive for shortness of breath and cough Gastrointestinal: Negative for abdominal pain, positive for diarrhea Musculoskeletal: Negative for back pain. Skin: Negative for rash. Neurological: Negative for headaches, positive for weakness  All systems negative/normal/unremarkable except as stated in the HPI  ____________________________________________   PHYSICAL EXAM:  VITAL SIGNS: ED Triage Vitals  Enc Vitals Group     BP      Pulse      Resp      Temp      Temp src      SpO2      Weight      Height       Head Circumference      Peak Flow      Pain Score      Pain Loc      Pain Edu?      Excl. in [place]?    Constitutional: Alert and oriented.  Morbidly obese, mild distress Eyes: Conjunctivae are normal. Normal extraocular movements. ENT      Head: Normocephalic and atraumatic.      Nose: No congestion/rhinnorhea.      Mouth/Throat: Mucous membranes are moist.      Neck: No stridor. Cardiovascular: Normal rate, regular rhythm. No murmurs, rubs, or gallops. Respiratory: Mild tachypnea with clear breath sounds Gastrointestinal: Soft and nontender. Normal bowel sounds Musculoskeletal: Nontender with normal range of motion in extremities. No lower extremity tenderness nor edema. Neurologic:  Normal speech and language. No gross focal neurologic deficits are appreciated.  Skin:  Skin is warm, dry and intact. No rash noted. Psychiatric: Mood and affect are normal. Speech and behavior are normal.  ____________________________________________  EKG: Interpreted by me.  Sinus tachycardia with a rate of 100 bpm, normal PR interval, normal QRS, normal QT  ____________________________________________  ED COURSE:  As part of my medical decision making, I reviewed the following data within the [place] History obtained from family if available, nursing notes, old chart and ekg, as well as notes from prior ED visits. Patient presented  for possible coronavirus infection, we will assess with labs and imaging as indicated at this time.   Procedures Jordan Greer was evaluated in Emergency Department on 02/22/2019 for the symptoms described in the history of present illness. He was evaluated in the context of the global COVID-19 pandemic, which necessitated consideration that the patient might be at risk for infection with the SARS-CoV-2 virus that causes COVID-19. Institutional protocols and algorithms that pertain to the evaluation of patients at risk for COVID-19 are in a state of  rapid change based on information released by regulatory bodies including the CDC and federal and state organizations. These policies and algorithms were followed during the patient's care in the ED.  ____________________________________________   LABS (pertinent positives/negatives)  Labs Reviewed  BASIC METABOLIC PANEL - Abnormal; Notable for the following components:      Result Value   Sodium 132 (*)    Glucose, Bld 249 (*)    Creatinine, Ser 1.69 (*)    Calcium 8.4 (*)    GFR calc non Af Amer 50 (*)    GFR calc Af Amer 58 (*)    All other components within normal limits  NOVEL CORONAVIRUS, NAA (HOSPITAL ORDER, SEND-OUT TO REF LAB)  RESPIRATORY PANEL BY PCR  CBC WITH DIFFERENTIAL/PLATELET  CBC WITH DIFFERENTIAL/PLATELET  BRAIN NATRIURETIC PEPTIDE  INFLUENZA PANEL BY PCR (TYPE A & B)   CRITICAL CARE Performed by: Laurence Aly   Total critical care time: 30 minutes  Critical care time was exclusive of separately billable procedures and treating other patients.  Critical care was necessary to treat or prevent imminent or life-threatening deterioration.  Critical care was time spent personally by me on the following activities: development of treatment plan with patient and/or surrogate as well as nursing, discussions with consultants, evaluation of patient's response to treatment, examination of patient, obtaining history from patient or surrogate, ordering and performing treatments and interventions, ordering and review of laboratory studies, ordering and review of radiographic studies, pulse oximetry and re-evaluation of patient's condition.  RADIOLOGY Images were viewed by me  Chest x-ray IMPRESSION: Patchy areas of airspace disease bilaterally concerning for multilobar pneumonia. ____________________________________________   DIFFERENTIAL DIAGNOSIS   Influenza, coronavirus, pneumonia, viral illness, CHF  FINAL ASSESSMENT AND PLAN  Multi lobar  pneumonia   Plan: The patient had presented for flulike symptoms with new onset oxygen requirement. Patient's labs have revealed mild renal insufficiency and normal white blood cell count. Patient's imaging was concerning for multi lobar pneumonia.  At this point he is only requiring 2 L of nasal cannula oxygen.  We have sent off for coronavirus testing.  I will discuss with the hospitalist for admission with the airborne precautions at this time.   Laurence Aly, MD    Note: This note was generated in part or whole with voice recognition software. Voice recognition is usually quite accurate but there are transcription errors that can and very often do occur. I apologize for any typographical errors that were not detected and corrected.     Earleen Newport, MD 02/22/19 1230

## 2019-02-22 NOTE — ED Notes (Signed)
Admitting at bedside 

## 2019-02-22 NOTE — Progress Notes (Signed)
Advanced care plan. Purpose of the Encounter: CODE STATUS Parties in Attendance: Patient Patient's Decision Capacity: Good Subjective/Patient's story:  Jordan Greer  is a 39 y.o. 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  Patient has cough and also has loss of taste and smell sensation.  He had fever for the last couple of days.  Patient also complains of chills for the last few days along with body aches.  Objective/Medical story Patient has bilateral infiltrates on chest x-ray.  He is hypoxic and needs oxygen via nasal cannula.  Needs further evaluation for COVID-19 testing and management of pneumonia. Goals of care determination:  Advance care directives goals of care treatment plan discussed Patient wants everything done which includes CPR, intubation ventilator if the need arises. CODE STATUS: Full code Time spent discussing advanced care planning: 16 minutes

## 2019-02-22 NOTE — H&P (Signed)
Jordan Greer at [place] NAME: Jordan Greer    MR#:  [id number]  DATE OF BIRTH:  1981/01/14  DATE OF ADMISSION:  02/22/2019  PRIMARY CARE PHYSICIAN: System, Pcp Not In   REQUESTING/REFERRING PHYSICIAN:   CHIEF COMPLAINT:   Chief Complaint  Patient presents with  . Shortness of Breath    HISTORY OF PRESENT ILLNESS: Jordan Greer  is a 38 y.o. 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  Patient has cough and also has loss of taste and smell sensation.  He had fever for the last couple of days.  Patient also complains of chills for the last few days along with body aches.  When he presented to the urgent care his oxygen saturation was around 81%.  Patient Harlene Salts there [facility]  COVID-19 virus test was sent in the emergency room patient put on airborne and contact precautions.  Chest x-ray revealed interstitial infiltrates.  White cell count is normal.  PAST MEDICAL HISTORY:   Past Medical History:  Diagnosis Date  . Colitis   . Colitis   . Diabetes mellitus without complication ([place])   . Hyperlipidemia   . Hypertension     PAST SURGICAL HISTORY:  Past Surgical History:  Procedure Laterality Date  . none      SOCIAL HISTORY:  Social History   Tobacco Use  . Smoking status: Never Smoker  . Smokeless tobacco: Never Used  Substance Use Topics  . Alcohol use: Yes    Alcohol/week: 18.0 standard drinks    Types: 18 Cans of beer per week    FAMILY HISTORY:  Family History  Problem Relation Age of Onset  . Hypertension Mother   . Diabetes Mellitus II Mother     DRUG ALLERGIES:  Allergies  Allergen Reactions  . Fish Allergy Itching and Nausea And Vomiting    REVIEW OF SYSTEMS:   CONSTITUTIONAL: Has fever, fatigue and weakness.  EYES: No blurred or double vision.   EARS, NOSE, AND THROAT: No tinnitus or ear pain.  RESPIRATORY: Has cough, shortness of breath,  No wheezing or hemoptysis.  CARDIOVASCULAR: No chest pain, orthopnea, edema.  GASTROINTESTINAL: No nausea, vomiting, diarrhea or abdominal pain.  GENITOURINARY: No dysuria, hematuria.  ENDOCRINE: No polyuria, nocturia,  HEMATOLOGY: No anemia, easy bruising or bleeding SKIN: No rash or lesion. MUSCULOSKELETAL: No joint pain or arthritis.   NEUROLOGIC: No tingling, numbness, weakness.  PSYCHIATRY: No anxiety or depression.   MEDICATIONS AT HOME:  Prior to Admission medications   Medication Sig Start Date End Date Taking? Authorizing Provider  amLODipine (NORVASC) 10 MG tablet Take 10 mg by mouth daily.   Yes [provider]  atorvastatin (LIPITOR) 20 MG tablet Take 20 mg by mouth daily.   Yes [provider]  furosemide (LASIX) 40 MG tablet Take 40 mg by mouth daily.   Yes [provider]  glipiZIDE (GLUCOTROL XL) 10 MG 24 hr tablet Take 10 mg by mouth 2 (two) times daily.    Yes [provider]  lisinopril (PRINIVIL,ZESTRIL) 20 MG tablet Take 10 mg by mouth daily.    Yes [provider]  metFORMIN (GLUCOPHAGE-XR) 500 MG 24 hr tablet Take 1,000 mg by mouth 2 (two) times daily.   Yes [provider]  metoprolol succinate (TOPROL-XL) 100 MG 24 hr tablet Take 100 mg  by mouth daily. Take with or immediately following a meal.   Yes [provider]      PHYSICAL EXAMINATION:   VITAL SIGNS: Blood pressure 129/90, pulse 95, temperature (!) 100.5 F (38.1 C), temperature source Oral, resp. rate 12, height 5' 10"  (1.778 m), weight (!) 163.3 kg, SpO2 95 %.  GENERAL:  38 y.o.-year-old patient lying in the bed with no acute distress.  EYES: Pupils equal, round, reactive to light and accommodation. No scleral icterus. Extraocular muscles intact.  HEENT: Head atraumatic, normocephalic. Oropharynx and nasopharynx clear.  NECK:  Supple, no  jugular venous distention. No thyroid enlargement, no tenderness.  LUNGS: Decreased breath sounds bilaterally, rales heard in both lungs. No use of accessory muscles of respiration.  CARDIOVASCULAR: S1, S2 normal. No murmurs, rubs, or gallops.  ABDOMEN: Soft, nontender, nondistended. Bowel sounds present. No organomegaly or mass.  EXTREMITIES: No pedal edema, cyanosis, or clubbing.  NEUROLOGIC: Cranial nerves II through XII are intact. Muscle strength 5/5 in all extremities. Sensation intact. Gait not checked.  PSYCHIATRIC: The patient is alert and oriented x 3.  SKIN: No obvious rash, lesion, or ulcer.   LABORATORY PANEL:   CBC Recent Labs  Lab 02/22/19 1147  WBC 4.7  HGB 13.9  HCT 41.3  PLT 184  MCV 89.0  MCH 30.0  MCHC 33.7  RDW 14.3  LYMPHSABS 1.3  MONOABS 0.3  EOSABS 0.0  BASOSABS 0.0   ------------------------------------------------------------------------------------------------------------------  Chemistries  Recent Labs  Lab 02/22/19 1147  NA 132*  K 3.9  CL 98  CO2 25  GLUCOSE 249*  BUN 19  CREATININE 1.69*  CALCIUM 8.4*   ------------------------------------------------------------------------------------------------------------------ estimated creatinine clearance is 90.6 mL/min (A) (by C-G formula based on SCr of 1.69 mg/dL (H)). ------------------------------------------------------------------------------------------------------------------ No results for input(s): TSH, T4TOTAL, T3FREE, THYROIDAB in the last 72 hours.  Invalid input(s): FREET3   Coagulation profile No results for input(s): INR, PROTIME in the last 168 hours. ------------------------------------------------------------------------------------------------------------------- No results for input(s): DDIMER in the last 72 hours. -------------------------------------------------------------------------------------------------------------------  Cardiac Enzymes No results for  input(s): CKMB, TROPONINI, MYOGLOBIN in the last 168 hours.  Invalid input(s): CK ------------------------------------------------------------------------------------------------------------------ Invalid input(s): POCBNP  ---------------------------------------------------------------------------------------------------------------  Urinalysis No results found for: COLORURINE, APPEARANCEUR, LABSPEC, PHURINE, GLUCOSEU, HGBUR, BILIRUBINUR, KETONESUR, PROTEINUR, UROBILINOGEN, NITRITE, LEUKOCYTESUR   RADIOLOGY: Dg Chest Port 1 View  Result Date: 02/22/2019 CLINICAL DATA:  Shortness of breath, dyspnea EXAM: PORTABLE CHEST 1 VIEW COMPARISON:  None. FINDINGS: There are patchy areas of airspace disease bilaterally concerning for multilobar pneumonia. There is no pleural effusion or pneumothorax. The heart and mediastinal contours are unremarkable. The osseous structures are unremarkable. IMPRESSION: Patchy areas of airspace disease bilaterally concerning for multilobar pneumonia. Electronically Signed   By: Kathreen Devoid   On: 02/22/2019 11:40    EKG: Orders placed or performed during the hospital encounter of 02/22/19  . ED EKG  . 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  Patient presented with cough, shortness of breath, fever, body aches.  He also has loss of sensation of taste and smell for the last couple of days.  -Bilateral pneumonia Appears viral versus bacterial Rule out coronavirus infection COVID-19 test sent out Airborne and contact precautions Check flu test Empirically start patient on IV Rocephin and Zithromax antibiotics Infectious disease consult Infection control team notified  -Hyponatremia Hydrate with IV fluids and follow-up sodium level  -  Type 2 diabetes mellitus Diabetic diet with sliding scale coverage with  insulin along with oral hypoglycemic drugs  -DVT prophylaxis subcu Lovenox daily  -Hypertension Continue amlodipine, ACE inhibitor along with metoprolol for control of blood pressure  All the records are reviewed and case discussed with ED provider. Management plans discussed with the patient, family and they are in agreement.  CODE STATUS:Full code  TOTAL TIME TAKING CARE OF THIS PATIENT: 53 minutes.    Saundra Shelling M.D on 02/22/2019 at 1:12 PM  Between 7am to 6pm - Pager - [phone]  After 6pm go to [email or web address] - password [email or web address] [facility]  Office  [phone]  CC: Primary care physician; System, Pcp Not In

## 2019-02-23 LAB — BASIC METABOLIC PANEL
Anion gap: 10 (ref 5–15)
BUN: 18 mg/dL (ref 6–20)
CO2: 25 mmol/L (ref 22–32)
Calcium: 7.9 mg/dL — ABNORMAL LOW (ref 8.9–10.3)
Chloride: 100 mmol/L (ref 98–111)
Creatinine, Ser: 1.72 mg/dL — ABNORMAL HIGH (ref 0.61–1.24)
GFR calc Af Amer: 57 mL/min — ABNORMAL LOW (ref >60)
GFR calc non Af Amer: 49 mL/min — ABNORMAL LOW (ref >60)
Glucose, Bld: 115 mg/dL — ABNORMAL HIGH (ref 70–99)
Potassium: 3.8 mmol/L (ref 3.5–5.1)
Sodium: 135 mmol/L (ref 135–145)

## 2019-02-23 LAB — CBC
HCT: 39.7 % (ref 39.0–52.0)
Hemoglobin: 13.1 g/dL (ref 13.0–17.0)
MCH: 30.3 pg (ref 26.0–34.0)
MCHC: 33 g/dL (ref 30.0–36.0)
MCV: 91.9 fL (ref 80.0–100.0)
Platelets: 187 10*3/uL (ref 150–400)
RBC: 4.32 MIL/uL (ref 4.22–5.81)
RDW: 14.6 % (ref 11.5–15.5)
WBC: 4.4 10*3/uL (ref 4.0–10.5)
nRBC: 0 % (ref 0.0–0.2)

## 2019-02-23 LAB — GLUCOSE, CAPILLARY
Glucose-Capillary: 106 mg/dL — ABNORMAL HIGH (ref 70–99)
Glucose-Capillary: 131 mg/dL — ABNORMAL HIGH (ref 70–99)
Glucose-Capillary: 63 mg/dL — ABNORMAL LOW (ref 70–99)
Glucose-Capillary: 83 mg/dL (ref 70–99)
Glucose-Capillary: 57 mg/dL — ABNORMAL LOW (ref 70–99)
Glucose-Capillary: 53 mg/dL — ABNORMAL LOW (ref 70–99)

## 2019-02-23 NOTE — TOC Initial Note (Signed)
Transition of Care [facility]) - Initial/Assessment Note    Patient Details  Name: Jordan Greer MRN: [id number] Date of Birth: 1980/12/17  Transition of Care [facility]) CM/SW Contact:    Elza Rafter, RN Phone Number: 02/23/2019, 8:35 AM  Clinical Narrative:       [facility] consult placed to rule out COVID-19.  Patient is independent from home.  No anticipated needs.              Expected Discharge Plan: Home/Self Care Barriers to Discharge: Continued Medical Work up   Patient Goals and CMS Choice        Expected Discharge Plan and Services Expected Discharge Plan: Home/Self Care                                  Prior Living Arrangements/Services                       Activities of Daily Living Home Assistive Devices/Equipment: None ADL Screening (condition at time of admission) Patient's cognitive ability adequate to safely complete daily activities?: Yes Is the patient deaf or have difficulty hearing?: No Does the patient have difficulty seeing, even when wearing glasses/contacts?: No Does the patient have difficulty concentrating, remembering, or making decisions?: No Patient able to express need for assistance with ADLs?: Yes Does the patient have difficulty dressing or bathing?: No Independently performs ADLs?: Yes (appropriate for developmental age) Does the patient have difficulty walking or climbing stairs?: No Weakness of Legs: Both Weakness of Arms/Hands: Both  Permission Sought/Granted                  Emotional Assessment              Admission diagnosis:  Hypoxia [R09.02] Community acquired pneumonia, unspecified laterality [J18.9] There are no active problems to display for this patient.  PCP:  System, Pcp Not In Pharmacy:   [facility] [place], [place] - [address] AT [facility] [place] [address] Phone: [phone] Fax: [phone]     Social Determinants of Health (SDOH) Interventions    Readmission Risk Interventions No flowsheet data found.
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
## 2019-02-23 NOTE — Progress Notes (Signed)
Patient presently resting in the bed, alert and oriented, patient has been febrile temp 102.0, tylenol 650 mg given, last temp was 100.2, family called was updated multiples time , still waiting  Lab result still pending , will continue to monitor .

## 2019-02-24 DIAGNOSIS — E119 Type 2 diabetes mellitus without complications: Secondary | ICD-10-CM

## 2019-02-24 DIAGNOSIS — Z91013 Allergy to seafood: Secondary | ICD-10-CM

## 2019-02-24 DIAGNOSIS — Z6841 Body Mass Index (BMI) 40.0 and over, adult: Secondary | ICD-10-CM

## 2019-02-24 DIAGNOSIS — Z20828 Contact with and (suspected) exposure to other viral communicable diseases: Secondary | ICD-10-CM

## 2019-02-24 DIAGNOSIS — R6889 Other general symptoms and signs: Secondary | ICD-10-CM

## 2019-02-24 DIAGNOSIS — J9601 Acute respiratory failure with hypoxia: Secondary | ICD-10-CM

## 2019-02-24 DIAGNOSIS — R918 Other nonspecific abnormal finding of lung field: Secondary | ICD-10-CM

## 2019-02-24 DIAGNOSIS — I10 Essential (primary) hypertension: Secondary | ICD-10-CM

## 2019-02-24 LAB — GLUCOSE, CAPILLARY
Glucose-Capillary: 119 mg/dL — ABNORMAL HIGH (ref 70–99)
Glucose-Capillary: 119 mg/dL — ABNORMAL HIGH (ref 70–99)
Glucose-Capillary: 43 mg/dL — CL (ref 70–99)
Glucose-Capillary: 55 mg/dL — ABNORMAL LOW (ref 70–99)
Glucose-Capillary: 65 mg/dL — ABNORMAL LOW (ref 70–99)
Glucose-Capillary: 68 mg/dL — ABNORMAL LOW (ref 70–99)
Glucose-Capillary: 76 mg/dL (ref 70–99)
Glucose-Capillary: 78 mg/dL (ref 70–99)
Glucose-Capillary: 79 mg/dL (ref 70–99)
Glucose-Capillary: 82 mg/dL (ref 70–99)
Glucose-Capillary: 97 mg/dL (ref 70–99)

## 2019-02-24 LAB — PROCALCITONIN: Procalcitonin: <0.1 ng/mL

## 2019-02-24 LAB — MRSA PCR SCREENING: MRSA by PCR: NEGATIVE

## 2019-02-24 LAB — FERRITIN: Ferritin: 949 ng/mL — ABNORMAL HIGH (ref 24–336)

## 2019-02-24 LAB — HIV ANTIBODY (ROUTINE TESTING W REFLEX): HIV Screen 4th Generation wRfx: NONREACTIVE

## 2019-02-24 MED ORDER — FUROSEMIDE 10 MG/ML IJ SOLN
INTRAMUSCULAR | Status: AC
  Filled 2019-02-24: qty 4

## 2019-02-24 MED ORDER — VITAMIN C 500 MG PO TABS
500.0000 mg | ORAL_TABLET | Freq: Every day | ORAL | Status: DC
  Administered 2019-02-24 – 2019-03-01 (×6): 500 mg via ORAL
  Filled 2019-02-24 (×6): qty 1

## 2019-02-24 MED ORDER — VITAMIN C 500 MG PO TABS: 500.0000 mg | ORAL_TABLET | Freq: Two times a day (BID) | ORAL | Status: DC

## 2019-02-24 MED ORDER — HYDROXYCHLOROQUINE SULFATE 200 MG PO TABS: 400.0000 mg | ORAL_TABLET | Freq: Two times a day (BID) | ORAL | Status: DC

## 2019-02-24 MED ORDER — HYDROXYCHLOROQUINE SULFATE 200 MG PO TABS
400.0000 mg | ORAL_TABLET | Freq: Two times a day (BID) | ORAL | Status: DC
  Administered 2019-02-24: 12:00:00 400 mg via ORAL
  Filled 2019-02-24: qty 2

## 2019-02-24 MED ORDER — DEXTROSE 50 % IV SOLN
INTRAVENOUS | Status: AC
  Filled 2019-02-24: qty 50

## 2019-02-24 MED ORDER — HYDROXYCHLOROQUINE SULFATE 200 MG PO TABS
400.0000 mg | ORAL_TABLET | Freq: Once | ORAL | Status: AC
  Administered 2019-02-24: 400 mg via ORAL
  Filled 2019-02-24: qty 2

## 2019-02-24 MED ORDER — ZINC SULFATE 220 (50 ZN) MG PO CAPS
220.0000 mg | ORAL_CAPSULE | Freq: Every day | ORAL | Status: AC
  Administered 2019-02-24 – 2019-02-28 (×5): 220 mg via ORAL
  Filled 2019-02-24 (×5): qty 1

## 2019-02-24 MED ORDER — COLCHICINE 0.6 MG PO TABS
0.6000 mg | ORAL_TABLET | Freq: Two times a day (BID) | ORAL | Status: DC
  Administered 2019-02-24 – 2019-02-27 (×7): 0.6 mg via ORAL
  Filled 2019-02-24 (×9): qty 1

## 2019-02-24 MED ORDER — SODIUM CHLORIDE 0.9 % IV SOLN
100.0000 mg | Freq: Two times a day (BID) | INTRAVENOUS | Status: DC
  Filled 2019-02-24: qty 100

## 2019-02-24 MED ORDER — NIFEDIPINE ER OSMOTIC RELEASE 30 MG PO TB24
30.0000 mg | ORAL_TABLET | ORAL | Status: DC
  Administered 2019-02-25 – 2019-02-26 (×2): 30 mg via ORAL
  Filled 2019-02-24 (×3): qty 1

## 2019-02-24 MED ORDER — HYDROXYCHLOROQUINE SULFATE 200 MG PO TABS
200.0000 mg | ORAL_TABLET | Freq: Two times a day (BID) | ORAL | Status: AC
  Administered 2019-02-25 – 2019-02-28 (×8): 200 mg via ORAL
  Filled 2019-02-24 (×8): qty 1

## 2019-02-24 MED ORDER — SODIUM CHLORIDE 0.9 % IV SOLN
500.0000 mg | INTRAVENOUS | Status: DC
  Administered 2019-02-25 – 2019-02-27 (×3): 500 mg via INTRAVENOUS
  Filled 2019-02-24 (×4): qty 500

## 2019-02-24 MED ORDER — DEXTROSE 50 % IV SOLN: 0.5000 | Freq: Once | INTRAVENOUS | Status: DC

## 2019-02-24 MED ORDER — HYDROXYCHLOROQUINE SULFATE 200 MG PO TABS: 200.0000 mg | ORAL_TABLET | Freq: Two times a day (BID) | ORAL | Status: DC
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
## 2019-02-24 NOTE — Consult Note (Signed)
Reason for Consult: Acute hypoxic respiratory failure with increasing O2 requirements in the setting of potential COVID 19  Referring Physician: Henrine Screws is an 38 y.o. male.  HPI: 39 year old African-American gentleman, lifelong never smoker with diabetes mellitus type 2 and morbid obesity, presented to [facility] on 5 April because of a fever of several days duration PTA with associated generalized malaise, anosmia,ageusia and shortness of breath.  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  Patient was evaluated in the emergency room on admission and was noted to have a saturation of 81% on room air.  Patient was noted to have bilateral interstitial infiltrates on chest x-ray.  She was admitted for supplemental oxygen and management.  He received first dose dose of hydroxychloroquine 400 mg this morning.  He had been noted to have increasing oxygen requirements and subsequently was noted to require nonrebreather mask at 100%.  At this point he has been transferred to the intensive care unit.  The patient on evaluation does not appear to be distressed.  States that his myalgias and arthralgias have improved somewhat but still feels quite fatigued.  Though he is short of breath he is not in respiratory distress.  He has been placed on 100% via high flow O2 with oxygen saturations in the low to mid 90s.  He does not describe any chest pain.  No other complaint at present.  He has a history of hypertension and is on an ACE inhibitor.   Past Medical History:  Diagnosis Date  . Colitis   . Colitis   . Diabetes mellitus without complication ([place])   . Hyperlipidemia   . Hypertension     Past Surgical History:  Procedure Laterality Date  . none      Family History  Problem Relation Age of Onset  . Hypertension Mother   . Diabetes Mellitus II Mother     Social History:  reports that he has never smoked. He has never used  smokeless tobacco. He reports current alcohol use of about 18.0 standard drinks of alcohol per week. He reports that he does not use drugs.  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  He is married and has a child at home. Family has not been ill.  Allergies:  Allergies  Allergen Reactions  . Fish Allergy Itching and Nausea And Vomiting    Medications:  I have reviewed the patient's current medications. Prior to Admission:  Medications Prior to Admission  Medication Sig Dispense Refill Last Dose  . amLODipine (NORVASC) 10 MG tablet Take 10 mg by mouth daily.   02/22/2019 at 0930  . atorvastatin (LIPITOR) 20 MG tablet Take 20 mg by mouth daily.   02/21/2019 at Unknown time  . furosemide (LASIX) 40 MG tablet Take 40 mg by mouth daily.   02/21/2019 at Unknown time  . glipiZIDE (GLUCOTROL XL) 10 MG 24 hr tablet Take 10 mg by mouth 2 (two) times daily.    02/21/2019 at Unknown time  . lisinopril (PRINIVIL,ZESTRIL) 20 MG tablet Take 10 mg by mouth daily.    02/22/2019 at 0930  . metFORMIN (GLUCOPHAGE-XR) 500 MG 24 hr tablet Take 1,000 mg by mouth 2 (two) times daily.   02/21/2019 at Unknown time  . metoprolol succinate (TOPROL-XL) 100 MG 24 hr tablet Take 100 mg by mouth daily. Take with or immediately following a meal.   02/22/2019 at 0930  Results for orders placed or performed during the hospital encounter of 02/22/19 (from the past 48 hour(s))  Glucose, capillary     Status: Abnormal   Collection Time: 02/22/19  8:10 PM  Result Value Ref Range   Glucose-Capillary 120 (H) 70 - 99 mg/dL   Comment 1 Notify RN    Comment 2 Document in Chart   HIV antibody (Routine Testing)     Status: None   Collection Time: 02/23/19  4:46 AM  Result Value Ref Range   HIV Screen 4th Generation wRfx Non Reactive Non Reactive    Comment: (NOTE) Performed At: [facility] [place], [place] [id number] Rush Farmer MD QQ:[id number]   Basic metabolic panel     Status: Abnormal    Collection Time: 02/23/19  4:46 AM  Result Value Ref Range   Sodium 135 135 - 145 mmol/L   Potassium 3.8 3.5 - 5.1 mmol/L   Chloride 100 98 - 111 mmol/L   CO2 25 22 - 32 mmol/L   Glucose, Bld 115 (H) 70 - 99 mg/dL   BUN 18 6 - 20 mg/dL   Creatinine, Ser 1.72 (H) 0.61 - 1.24 mg/dL   Calcium 7.9 (L) 8.9 - 10.3 mg/dL   GFR calc non Af Amer 49 (L) >60 mL/min   GFR calc Af Amer 57 (L) >60 mL/min   Anion gap 10 5 - 15    Comment: Performed at [facility], [address] [place], [place] [address]  CBC     Status: None   Collection Time: 02/23/19  4:46 AM  Result Value Ref Range   WBC 4.4 4.0 - 10.5 K/uL   RBC 4.32 4.22 - 5.81 MIL/uL   Hemoglobin 13.1 13.0 - 17.0 g/dL   HCT 39.7 39.0 - 52.0 %   MCV 91.9 80.0 - 100.0 fL   MCH 30.3 26.0 - 34.0 pg   MCHC 33.0 30.0 - 36.0 g/dL   RDW 14.6 11.5 - 15.5 %   Platelets 187 150 - 400 K/uL   nRBC 0.0 0.0 - 0.2 %    Comment: Performed at [facility], [address] [place], [place] [address]  Glucose, capillary     Status: Abnormal   Collection Time: 02/23/19  8:49 AM  Result Value Ref Range   Glucose-Capillary 106 (H) 70 - 99 mg/dL  Glucose, capillary     Status: Abnormal   Collection Time: 02/23/19 11:53 AM  Result Value Ref Range   Glucose-Capillary 131 (H) 70 - 99 mg/dL  Glucose, capillary     Status: Abnormal   Collection Time: 02/23/19  4:08 PM  Result Value Ref Range   Glucose-Capillary 63 (L) 70 - 99 mg/dL  Glucose, capillary     Status: None   Collection Time: 02/23/19  4:43 PM  Result Value Ref Range   Glucose-Capillary 83 70 - 99 mg/dL  Glucose, capillary     Status: Abnormal   Collection Time: 02/23/19  9:15 PM  Result Value Ref Range   Glucose-Capillary 57 (L) 70 - 99 mg/dL  Glucose, capillary     Status: Abnormal   Collection Time: 02/23/19 11:02 PM  Result Value Ref Range   Glucose-Capillary 53 (L) 70 - 99 mg/dL  Glucose, capillary     Status: None   Collection Time: 02/24/19 12:23 AM   Result Value Ref Range   Glucose-Capillary 78 70 - 99 mg/dL  Glucose, capillary     Status: None   Collection Time:  02/24/19  2:17 AM  Result Value Ref Range   Glucose-Capillary 82 70 - 99 mg/dL  Glucose, capillary     Status: Abnormal   Collection Time: 02/24/19  8:35 AM  Result Value Ref Range   Glucose-Capillary 55 (L) 70 - 99 mg/dL  Glucose, capillary     Status: Abnormal   Collection Time: 02/24/19  9:21 AM  Result Value Ref Range   Glucose-Capillary 119 (H) 70 - 99 mg/dL  Glucose, capillary     Status: None   Collection Time: 02/24/19 11:27 AM  Result Value Ref Range   Glucose-Capillary 97 70 - 99 mg/dL  Procalcitonin - Baseline     Status: None   Collection Time: 02/24/19 11:39 AM  Result Value Ref Range   Procalcitonin <0.10 ng/mL    Comment:        Interpretation: PCT (Procalcitonin) <= 0.5 ng/mL: Systemic infection (sepsis) is not likely. Local bacterial infection is possible. (NOTE)       Sepsis PCT Algorithm           Lower Respiratory Tract                                      Infection PCT Algorithm    ----------------------------     ----------------------------         PCT < 0.25 ng/mL                PCT < 0.10 ng/mL         Strongly encourage             Strongly discourage   discontinuation of antibiotics    initiation of antibiotics    ----------------------------     -----------------------------       PCT 0.25 - 0.50 ng/mL            PCT 0.10 - 0.25 ng/mL               OR       >80% decrease in PCT            Discourage initiation of                                            antibiotics      Encourage discontinuation           of antibiotics    ----------------------------     -----------------------------         PCT >= 0.50 ng/mL              PCT 0.26 - 0.50 ng/mL               AND        <80% decrease in PCT             Encourage initiation of                                             antibiotics       Encourage continuation           of  antibiotics    ----------------------------     -----------------------------  PCT >= 0.50 ng/mL                  PCT > 0.50 ng/mL               AND         increase in PCT                  Strongly encourage                                      initiation of antibiotics    Strongly encourage escalation           of antibiotics                                     -----------------------------                                           PCT <= 0.25 ng/mL                                                 OR                                        > 80% decrease in PCT                                     Discontinue / Do not initiate                                             antibiotics Performed at [facility], [address] [place], [place] [address]   Ferritin     Status: Abnormal   Collection Time: 02/24/19 11:39 AM  Result Value Ref Range   Ferritin 949 (H) 24 - 336 ng/mL    Comment: Performed at [facility], [address] [place], [place] [address]  Glucose, capillary     Status: Abnormal   Collection Time: 02/24/19  1:31 PM  Result Value Ref Range   Glucose-Capillary 65 (L) 70 - 99 mg/dL  Glucose, capillary     Status: None   Collection Time: 02/24/19  2:32 PM  Result Value Ref Range   Glucose-Capillary 76 70 - 99 mg/dL  MRSA PCR Screening     Status: None   Collection Time: 02/24/19  3:28 PM  Result Value Ref Range   MRSA by PCR NEGATIVE NEGATIVE    Comment:        The GeneXpert MRSA Assay (FDA approved for NASAL specimens only), is one component of a comprehensive MRSA colonization surveillance program. It is not intended to diagnose MRSA infection nor to guide or monitor treatment for MRSA infections. Performed at [facility], [address] [place], [place] [address]  No results found.  Review of Systems  Constitutional: Positive for chills, fever and malaise/fatigue. Negative for diaphoresis.  HENT: Positive for  sore throat.        Anosmia and ageusia  Eyes: Negative.   Respiratory: Positive for shortness of breath. Negative for cough and sputum production.   Cardiovascular: Negative.   Gastrointestinal: Negative.   Genitourinary: Negative.   Musculoskeletal: Positive for myalgias.  Skin: Negative.   Neurological: Negative.   Endo/Heme/Allergies: Negative.   Psychiatric/Behavioral: Negative.   All other systems reviewed and are negative.  Blood pressure (!) 147/87, pulse 93, temperature 98.6 F (37 C), temperature source Oral, resp. rate (!) 29, height 5' 10"  (1.778 m), weight (!) 163.3 kg, SpO2 96 %. Physical Exam  Nursing note and vitals reviewed. Constitutional: He is oriented to person, place, and time. He appears well-developed. He is cooperative. He appears ill. No distress.  Morbidly obese.  HENT:  Head: Normocephalic and atraumatic.  Right Ear: External ear normal.  Left Ear: External ear normal.  Nose: Nose normal.  Mouth/Throat: Oropharynx is clear and moist and mucous membranes are normal. No oropharyngeal exudate.  Mallampati Class IV airway. Crowded airway, macroglossia  Eyes: Pupils are equal, round, and reactive to light. Conjunctivae are normal. No scleral icterus.  Neck: Neck supple. No tracheal deviation present. No thyromegaly present.  Very thick neck, cannot appreciate adenopathy nor JVD.  Cardiovascular: Normal rate and regular rhythm.  Respiratory: Effort normal.  Because of need of full PPE and limitations to auscultation a long POCUS was performed.  Ultrasound images showed that the pleura was thickened and irregular.  The patient had subpleural consolidations on the right lung and B-lines noted.  Examination was similar on the left.  This is consistent with viral pneumonia.  GI: Soft. He exhibits no mass. There is no abdominal tenderness.  Obese could not discern any masses due to obesity.   Musculoskeletal: Normal range of motion.        General: No tenderness,  deformity or edema.  Neurological: He is alert and oriented to person, place, and time. No cranial nerve deficit. Coordination normal.  Skin: Skin is warm and dry.  Psychiatric: He has a normal mood and affect. His behavior is normal.    Assessment/Plan: 1. Acute hypoxic respiratory failure: Findings on POCUS consistent with viral pneumonia.  Given the patient's exposure and laboratory findings this is highly suggestive of COVID-19 as the cause.  The patient has been placed on high flow nasal cannula.  We will proceed with non-intubated prone ventilation as patient can tolerate.  This would include 2 hours prone to our supine alternating for a total of 16 hours.  Incentive spirometry while supine.  Maintain oxygen saturations at 90% or better to prevent hypoxic vasoconstriction.  Lasix x1.  Repeat chest x-ray in the morning.  At risk for intubation, he will likely be difficult airway given his obesity, Mallampati class IV airway and crowded airway structures and macroglossia.   2.  High index of suspicion for COVID-19: Initiated hydroxychloroquine, continue azithromycin after discussion with ID.  Currently QT interval normal, monitor closely.  Monitor for signs of potential cytokine storm, follow ferritin daily.  Empiric colchicine 0.6 mg twice a day as per [place] experience/trial.If develops full cytokine storm will treat with Tocilizumab.   3.  Hypertension: Discontinue ACE inhibitor.  Change calcium channel blocker to nifedipine.   4.  Diabetes mellitus type 2: He actually had an episode of hypoglycemia: Sliding scale insulin for now.Monitor.  5.  Morbid obesity BMI of 51, this issue adds complexity to his management.  6.  Prophylaxis: DVT- enoxaparin.   Case discussed with Dr. Posey Pronto.  Also discussed with Dr. Delaine Lame of infectious disease.  Critical care time 60 minutes.    Renold Don, MD [facility]   02/24/2019, 6:44 PM

## 2019-02-24 NOTE — Progress Notes (Signed)
Transfering to ICU due to high oxygen demand.Spouse updated

## 2019-02-24 NOTE — Progress Notes (Signed)
Seen by infectious disease MD,started him on hydroxychloroquine and first dose given.Spouse updated

## 2019-02-24 NOTE — Progress Notes (Signed)
[place] spoke w/ Jordan Greer over the phone. Jordan Greer exhibited labored breathing but was able to hold a brief conversation. Jordan Greer stated he felt like he was improving yet still having issues w/ breathing. 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 Ch learned that this Jordan Greer was transferred to ICU-1 due to the issues w/ breathing.  F/u recomm.    02/24/19 1115  Clinical Encounter Type  Visited With Patient  Visit Type Psychological support;Spiritual support;Social support  Spiritual Encounters  Spiritual Needs Emotional;Grief support  Stress Factors  Patient Stress Factors Exhausted;Health changes;Major life changes  Family Stress Factors None identified
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
## 2019-02-24 NOTE — Progress Notes (Signed)
Patient unable to tolerate nasal canula,said he is not getting enough air,non rebreather mask 15L in progress.

## 2019-02-25 ENCOUNTER — Inpatient Hospital Stay: Payer: Federal, State, Local not specified - PPO

## 2019-02-25 DIAGNOSIS — G4733 Obstructive sleep apnea (adult) (pediatric): Secondary | ICD-10-CM

## 2019-02-25 DIAGNOSIS — N183 Chronic kidney disease, stage 3 unspecified: Secondary | ICD-10-CM | POA: Insufficient documentation

## 2019-02-25 DIAGNOSIS — E785 Hyperlipidemia, unspecified: Secondary | ICD-10-CM | POA: Insufficient documentation

## 2019-02-25 DIAGNOSIS — K5120 Ulcerative (chronic) proctitis without complications: Secondary | ICD-10-CM | POA: Insufficient documentation

## 2019-02-25 DIAGNOSIS — E1159 Type 2 diabetes mellitus with other circulatory complications: Secondary | ICD-10-CM | POA: Insufficient documentation

## 2019-02-25 DIAGNOSIS — J988 Other specified respiratory disorders: Secondary | ICD-10-CM

## 2019-02-25 DIAGNOSIS — R0902 Hypoxemia: Secondary | ICD-10-CM

## 2019-02-25 DIAGNOSIS — I10 Essential (primary) hypertension: Secondary | ICD-10-CM

## 2019-02-25 DIAGNOSIS — G4730 Sleep apnea, unspecified: Secondary | ICD-10-CM | POA: Insufficient documentation

## 2019-02-25 DIAGNOSIS — J309 Allergic rhinitis, unspecified: Secondary | ICD-10-CM | POA: Insufficient documentation

## 2019-02-25 LAB — CBC
HCT: 40.1 % (ref 39.0–52.0)
Hemoglobin: 13.1 g/dL (ref 13.0–17.0)
MCH: 29.4 pg (ref 26.0–34.0)
MCHC: 32.7 g/dL (ref 30.0–36.0)
MCV: 90.1 fL (ref 80.0–100.0)
Platelets: 249 10*3/uL (ref 150–400)
RBC: 4.45 MIL/uL (ref 4.22–5.81)
RDW: 14.4 % (ref 11.5–15.5)
WBC: 5.6 10*3/uL (ref 4.0–10.5)
nRBC: 0 % (ref 0.0–0.2)

## 2019-02-25 LAB — HEPATIC FUNCTION PANEL
ALT: 62 U/L — ABNORMAL HIGH (ref 0–44)
AST: 69 U/L — ABNORMAL HIGH (ref 15–41)
Albumin: 3 g/dL — ABNORMAL LOW (ref 3.5–5.0)
Alkaline Phosphatase: 51 U/L (ref 38–126)
Bilirubin, Direct: 0.2 mg/dL (ref 0.0–0.2)
Indirect Bilirubin: 0.5 mg/dL (ref 0.3–0.9)
Total Bilirubin: 0.7 mg/dL (ref 0.3–1.2)
Total Protein: 7 g/dL (ref 6.5–8.1)

## 2019-02-25 LAB — BASIC METABOLIC PANEL
Anion gap: 9 (ref 5–15)
BUN: 11 mg/dL (ref 6–20)
CO2: 25 mmol/L (ref 22–32)
Calcium: 8.3 mg/dL — ABNORMAL LOW (ref 8.9–10.3)
Chloride: 102 mmol/L (ref 98–111)
Creatinine, Ser: 1.29 mg/dL — ABNORMAL HIGH (ref 0.61–1.24)
GFR calc Af Amer: >60 mL/min (ref >60)
GFR calc non Af Amer: >60 mL/min (ref >60)
Glucose, Bld: 118 mg/dL — ABNORMAL HIGH (ref 70–99)
Potassium: 3.9 mmol/L (ref 3.5–5.1)
Sodium: 136 mmol/L (ref 135–145)

## 2019-02-25 LAB — C-REACTIVE PROTEIN: CRP: 15.7 mg/dL — ABNORMAL HIGH (ref <1.0)

## 2019-02-25 LAB — GLUCOSE, CAPILLARY
Glucose-Capillary: 127 mg/dL — ABNORMAL HIGH (ref 70–99)
Glucose-Capillary: 182 mg/dL — ABNORMAL HIGH (ref 70–99)
Glucose-Capillary: 213 mg/dL — ABNORMAL HIGH (ref 70–99)
Glucose-Capillary: 279 mg/dL — ABNORMAL HIGH (ref 70–99)

## 2019-02-25 LAB — FERRITIN: Ferritin: 820 ng/mL — ABNORMAL HIGH (ref 24–336)

## 2019-02-25 LAB — MAGNESIUM: Magnesium: 2.5 mg/dL — ABNORMAL HIGH (ref 1.7–2.4)

## 2019-02-25 LAB — NOVEL CORONAVIRUS, NAA (HOSP ORDER, SEND-OUT TO REF LAB; TAT 18-24 HRS): SARS-CoV-2, NAA: DETECTED — AB

## 2019-02-25 LAB — PHOSPHORUS: Phosphorus: 3.3 mg/dL (ref 2.5–4.6)

## 2019-02-25 LAB — GAMMA GT: GGT: 41 U/L (ref 7–50)

## 2019-02-25 LAB — FIBRINOGEN: Fibrinogen: 732 mg/dL — ABNORMAL HIGH (ref 210–475)

## 2019-02-25 LAB — FIBRIN DERIVATIVES D-DIMER (ARMC ONLY): Fibrin derivatives D-dimer (ARMC): 1075.99 ng/mL (FEU) — ABNORMAL HIGH (ref 0.00–499.00)

## 2019-02-25 MED ORDER — FUROSEMIDE 10 MG/ML IJ SOLN
40.0000 mg | Freq: Once | INTRAMUSCULAR | Status: AC
  Administered 2019-02-25: 09:00:00 40 mg via INTRAVENOUS
  Filled 2019-02-25: qty 4

## 2019-02-25 MED ORDER — SENNOSIDES-DOCUSATE SODIUM 8.6-50 MG PO TABS
1.0000 | ORAL_TABLET | Freq: Every day | ORAL | Status: DC
  Administered 2019-02-26: 23:00:00 1 via ORAL
  Filled 2019-02-25: qty 1

## 2019-02-25 MED ORDER — TRAMADOL HCL 50 MG PO TABS
100.0000 mg | ORAL_TABLET | Freq: Four times a day (QID) | ORAL | Status: DC | PRN
  Filled 2019-02-25: qty 2

## 2019-02-25 MED ORDER — FUROSEMIDE 10 MG/ML IJ SOLN
40.0000 mg | Freq: Once | INTRAMUSCULAR | Status: AC
  Administered 2019-02-25: 18:00:00 40 mg via INTRAVENOUS
  Filled 2019-02-25: qty 4

## 2019-02-25 NOTE — Progress Notes (Signed)
covid 19 test positive Further management as per intensivist

## 2019-02-25 NOTE — Progress Notes (Signed)
   Date of Admission:  02/22/2019  COVID 19 illness Acute hypoxia B/l lung inflitrates Morbid obesity OSA    Objective: Vital signs in last 24 hours: Temp:  [97.9 F (36.6 C)-100 F (37.8 C)] 99 F (37.2 C) (04/08 1757) Pulse Rate:  [71-96] 88 (04/08 1800) Resp:  [18-36] 27 (04/08 1800) BP: (108-166)/(45-130) 133/87 (04/08 1800) SpO2:  [92 %-100 %] 98 % (04/08 1800) FiO2 (%):  [50 %-97 %] 50 % (04/08 1757)  PHYSICAL EXAM:  Sitting in chair Getting hiflo oxygen  Lab Results Recent Labs    02/23/19 0446 02/25/19 0333  WBC 4.4 5.6  HGB 13.1 13.1  HCT 39.7 40.1  NA 135 136  K 3.8 3.9  CL 100 102  CO2 25 25  BUN 18 11  CREATININE 1.72* 1.29*   Liver Panel Recent Labs    02/25/19 0333  PROT 7.0  ALBUMIN 3.0*  AST 69*  ALT 62*  ALKPHOS 51  BILITOT 0.7  BILIDIR 0.2  IBILI 0.5   Sedimentation Rate No results for input(s): ESRSEDRATE in the last 72 hours. C-Reactive Protein Recent Labs    02/25/19 0333  CRP 15.7*    Microbiology:  Studies/Results:    Dg Chest Port 1 View  Result Date: 02/25/2019 CLINICAL DATA:  Respiratory failure EXAM: PORTABLE CHEST 1 VIEW COMPARISON:  Three days ago FINDINGS: Low volume chest with worsening bilateral airspace disease. History of COVID. No effusion or pneumothorax. Cardiomegaly. Vascular congestion. IMPRESSION: Worsening lung volumes and airspace disease. Electronically Signed   By: Monte Fantasia M.D.   On: 02/25/2019 07:38     Assessment/Plan: COVID 19 illness with hypoxia related to lung invlovement with underlying morbid obesity, OSA  On hydroxychloroquine, azithromycin and ceftriaxone Also on colchicine ferritin compared to yesterday is declining Observe closely for cytokine storm and need for IL6 inhibitor
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
## 2019-02-26 ENCOUNTER — Inpatient Hospital Stay: Payer: Federal, State, Local not specified - PPO

## 2019-02-26 LAB — CBC WITH DIFFERENTIAL/PLATELET
Abs Immature Granulocytes: 0.02 10*3/uL (ref 0.00–0.07)
Basophils Absolute: 0 10*3/uL (ref 0.0–0.1)
Basophils Relative: 0 %
Eosinophils Absolute: 0.1 10*3/uL (ref 0.0–0.5)
Eosinophils Relative: 1 %
HCT: 40 % (ref 39.0–52.0)
Hemoglobin: 13.3 g/dL (ref 13.0–17.0)
Immature Granulocytes: 0 %
Lymphocytes Relative: 32 %
Lymphs Abs: 1.8 10*3/uL (ref 0.7–4.0)
MCH: 30.5 pg (ref 26.0–34.0)
MCHC: 33.3 g/dL (ref 30.0–36.0)
MCV: 91.7 fL (ref 80.0–100.0)
Monocytes Absolute: 0.5 10*3/uL (ref 0.1–1.0)
Monocytes Relative: 10 %
Neutro Abs: 3.2 10*3/uL (ref 1.7–7.7)
Neutrophils Relative %: 57 %
Platelets: 316 10*3/uL (ref 150–400)
RBC: 4.36 MIL/uL (ref 4.22–5.81)
RDW: 14.5 % (ref 11.5–15.5)
WBC: 5.6 10*3/uL (ref 4.0–10.5)
nRBC: 0 % (ref 0.0–0.2)

## 2019-02-26 LAB — HEPATIC FUNCTION PANEL
ALT: 70 U/L — ABNORMAL HIGH (ref 0–44)
AST: 68 U/L — ABNORMAL HIGH (ref 15–41)
Albumin: 3 g/dL — ABNORMAL LOW (ref 3.5–5.0)
Alkaline Phosphatase: 59 U/L (ref 38–126)
Bilirubin, Direct: 0.2 mg/dL (ref 0.0–0.2)
Indirect Bilirubin: 0.4 mg/dL (ref 0.3–0.9)
Total Bilirubin: 0.6 mg/dL (ref 0.3–1.2)
Total Protein: 7.1 g/dL (ref 6.5–8.1)

## 2019-02-26 LAB — BASIC METABOLIC PANEL
Anion gap: 11 (ref 5–15)
BUN: 17 mg/dL (ref 6–20)
CO2: 27 mmol/L (ref 22–32)
Calcium: 8.6 mg/dL — ABNORMAL LOW (ref 8.9–10.3)
Chloride: 99 mmol/L (ref 98–111)
Creatinine, Ser: 1.53 mg/dL — ABNORMAL HIGH (ref 0.61–1.24)
GFR calc Af Amer: >60 mL/min (ref >60)
GFR calc non Af Amer: 56 mL/min — ABNORMAL LOW (ref >60)
Glucose, Bld: 208 mg/dL — ABNORMAL HIGH (ref 70–99)
Potassium: 4.1 mmol/L (ref 3.5–5.1)
Sodium: 137 mmol/L (ref 135–145)

## 2019-02-26 LAB — FERRITIN: Ferritin: 685 ng/mL — ABNORMAL HIGH (ref 24–336)

## 2019-02-26 LAB — GLUCOSE, CAPILLARY
Glucose-Capillary: 219 mg/dL — ABNORMAL HIGH (ref 70–99)
Glucose-Capillary: 197 mg/dL — ABNORMAL HIGH (ref 70–99)
Glucose-Capillary: 230 mg/dL — ABNORMAL HIGH (ref 70–99)
Glucose-Capillary: 212 mg/dL — ABNORMAL HIGH (ref 70–99)

## 2019-02-26 LAB — PHOSPHORUS: Phosphorus: 3.2 mg/dL (ref 2.5–4.6)

## 2019-02-26 LAB — MAGNESIUM: Magnesium: 2.4 mg/dL (ref 1.7–2.4)

## 2019-02-26 MED ORDER — NIFEDIPINE ER 60 MG PO TB24
60.0000 mg | ORAL_TABLET | ORAL | Status: DC
  Administered 2019-02-27 – 2019-03-01 (×3): 60 mg via ORAL
  Filled 2019-02-26 (×3): qty 1
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
## 2019-02-26 NOTE — Progress Notes (Signed)
Pt switched to 5 L [place], spo2 99%. Pt in no distress, will continue to monitor.
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
## 2019-02-27 LAB — CBC WITH DIFFERENTIAL/PLATELET
Abs Immature Granulocytes: 0.03 10*3/uL (ref 0.00–0.07)
Basophils Absolute: 0 10*3/uL (ref 0.0–0.1)
Basophils Relative: 1 %
Eosinophils Absolute: 0.2 10*3/uL (ref 0.0–0.5)
Eosinophils Relative: 2 %
HCT: 39.8 % (ref 39.0–52.0)
Hemoglobin: 13 g/dL (ref 13.0–17.0)
Immature Granulocytes: 1 %
Lymphocytes Relative: 34 %
Lymphs Abs: 2.2 10*3/uL (ref 0.7–4.0)
MCH: 30 pg (ref 26.0–34.0)
MCHC: 32.7 g/dL (ref 30.0–36.0)
MCV: 91.9 fL (ref 80.0–100.0)
Monocytes Absolute: 0.6 10*3/uL (ref 0.1–1.0)
Monocytes Relative: 9 %
Neutro Abs: 3.5 10*3/uL (ref 1.7–7.7)
Neutrophils Relative %: 53 %
Platelets: 410 10*3/uL — ABNORMAL HIGH (ref 150–400)
RBC: 4.33 MIL/uL (ref 4.22–5.81)
RDW: 14.1 % (ref 11.5–15.5)
WBC: 6.4 10*3/uL (ref 4.0–10.5)
nRBC: 0 % (ref 0.0–0.2)

## 2019-02-27 LAB — FERRITIN: Ferritin: 724 ng/mL — ABNORMAL HIGH (ref 24–336)

## 2019-02-27 LAB — COMPREHENSIVE METABOLIC PANEL
ALT: 76 U/L — ABNORMAL HIGH (ref 0–44)
AST: 63 U/L — ABNORMAL HIGH (ref 15–41)
Albumin: 3.2 g/dL — ABNORMAL LOW (ref 3.5–5.0)
Alkaline Phosphatase: 71 U/L (ref 38–126)
Anion gap: 11 (ref 5–15)
BUN: 16 mg/dL (ref 6–20)
CO2: 26 mmol/L (ref 22–32)
Calcium: 8.9 mg/dL (ref 8.9–10.3)
Chloride: 100 mmol/L (ref 98–111)
Creatinine, Ser: 1.43 mg/dL — ABNORMAL HIGH (ref 0.61–1.24)
GFR calc Af Amer: >60 mL/min (ref >60)
GFR calc non Af Amer: >60 mL/min (ref >60)
Glucose, Bld: 181 mg/dL — ABNORMAL HIGH (ref 70–99)
Potassium: 3.9 mmol/L (ref 3.5–5.1)
Sodium: 137 mmol/L (ref 135–145)
Total Bilirubin: 0.7 mg/dL (ref 0.3–1.2)
Total Protein: 7.6 g/dL (ref 6.5–8.1)

## 2019-02-27 LAB — GLUCOSE, CAPILLARY
Glucose-Capillary: 175 mg/dL — ABNORMAL HIGH (ref 70–99)
Glucose-Capillary: 220 mg/dL — ABNORMAL HIGH (ref 70–99)
Glucose-Capillary: 256 mg/dL — ABNORMAL HIGH (ref 70–99)
Glucose-Capillary: 199 mg/dL — ABNORMAL HIGH (ref 70–99)

## 2019-02-27 MED ORDER — SENNOSIDES-DOCUSATE SODIUM 8.6-50 MG PO TABS
1.0000 | ORAL_TABLET | Freq: Every day | ORAL | Status: DC
  Filled 2019-02-27 (×2): qty 1

## 2019-02-27 MED ORDER — INSULIN GLARGINE 100 UNIT/ML ~~LOC~~ SOLN
20.0000 [IU] | Freq: Every day | SUBCUTANEOUS | Status: DC
  Administered 2019-02-27: 21:00:00 20 [IU] via SUBCUTANEOUS
  Filled 2019-02-27: qty 0.2

## 2019-02-27 MED ORDER — COLCHICINE 0.6 MG PO TABS
0.6000 mg | ORAL_TABLET | Freq: Two times a day (BID) | ORAL | Status: DC
  Administered 2019-02-27 – 2019-03-01 (×4): 0.6 mg via ORAL
  Filled 2019-02-27 (×4): qty 1
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
## 2019-02-27 NOTE — Progress Notes (Signed)
He is floor status.  There are no beds available on MedSurg floors.  He is COVID-19 positive.  Presently comfortable on [place] O2.  Possible discharge in next day or two.  I did not round on him formally today but have made myself available to nursing staff as needed.  Merton Border, MD PCCM service Mobile [phone] Pager [phone] 02/27/2019 3:09 PM

## 2019-02-27 NOTE — Progress Notes (Signed)
   Date of Admission:  02/22/2019    02/27/19   Subjective: Doing better On room air intermittently eating  Medications:  . atorvastatin  20 mg Oral q1800  . colchicine  0.6 mg Oral BID  . enoxaparin (LOVENOX) injection  40 mg Subcutaneous Q12H  . hydroxychloroquine  200 mg Oral BID  . insulin aspart  0-15 Units Subcutaneous TID WC  . insulin aspart  0-5 Units Subcutaneous QHS  . metoprolol succinate  100 mg Oral Daily  . NIFEdipine  60 mg Oral Q24H  . senna-docusate  1 tablet Oral QHS  . vitamin C  500 mg Oral Daily  . zinc sulfate  220 mg Oral Daily    Objective: Vital signs in last 24 hours: Temp:  [98.5 F (36.9 C)-99.2 F (37.3 C)] 99 F (37.2 C) (04/10 0800) Pulse Rate:  [74-93] 87 (04/10 0900) Resp:  [0-30] 0 (04/10 0900) BP: (121-152)/(85-118) 131/90 (04/10 0400) SpO2:  [91 %-100 %] 91 % (04/10 0900) FiO2 (%):  [35 %] 35 % (04/09 2300)  PHYSICAL EXAM:  General: Alert, cooperative, no distress, looks well Unable to listen to his heart or lungs due to PPE Lab Results Recent Labs    02/26/19 0256 02/27/19 0559  WBC 5.6 6.4  HGB 13.3 13.0  HCT 40.0 39.8  NA 137 137  K 4.1 3.9  CL 99 100  CO2 27 26  BUN 17 16  CREATININE 1.53* 1.43*   Liver Panel Recent Labs    02/25/19 0333 02/26/19 0422 02/27/19 0559  PROT 7.0 7.1 7.6  ALBUMIN 3.0* 3.0* 3.2*  AST 69* 68* 63*  ALT 62* 70* 76*  ALKPHOS 51 59 71  BILITOT 0.7 0.6 0.7  BILIDIR 0.2 0.2  --   IBILI 0.5 0.4  --    Sedimentation Rate No results for input(s): ESRSEDRATE in the last 72 hours. C-Reactive Protein Recent Labs    02/25/19 0333  CRP 15.7*    Microbiology:  Studies/Results: Dg Chest Port 1 View  Result Date: 02/26/2019 CLINICAL DATA:  Acute respiratory failure EXAM: PORTABLE CHEST 1 VIEW COMPARISON:  Yesterday FINDINGS: Stable bilateral airspace disease. Low lung volumes and cardiomegaly. No effusion or pneumothorax IMPRESSION: Stable low volume chest with bilateral airspace disease.  Electronically Signed   By: Monte Fantasia M.D.   On: 02/26/2019 07:40     Assessment/Plan: COVID 19 illness with hypoxia related to lung invlovement with underlying morbid obesity, OSA  On hydroxychloroquine , azithromycin ,colchicine Doing very well- one more day of Hydroxychloroquine-( until 02/28/19 ) azithromycin can be discontinued  ID will sign off- call if needed

## 2019-02-27 NOTE — Progress Notes (Signed)
Inpatient Diabetes Program Recommendations  AACE/ADA: New Consensus Statement on Inpatient Glycemic Control  Target Ranges:  Prepandial:   less than 140 mg/dL      Peak postprandial:   less than 180 mg/dL (1-2 hours)      Critically ill patients:  140 - 180 mg/dL   Results for Jordan Greer, Jordan Greer (MRN [id number]) as of 02/27/2019 09:16  Ref. Range 02/26/2019 08:32 02/26/2019 12:08 02/26/2019 16:27 02/26/2019 22:28 02/27/2019 08:45  Glucose-Capillary Latest Ref Range: 70 - 99 mg/dL 219 (H) 197 (H) 230 (H) 212 (H) 175 (H)   Review of Glycemic Control  Diabetes history: DM2 Outpatient Diabetes medications: Glipizide XL 10 mg BID, Metformin 1000 mg BID Current orders for Inpatient glycemic control: Novolog 0-15 units TID with meals, Novolog 0-5 units QHS  Inpatient Diabetes Program Recommendations:    Insulin-Meal Coverage: Please consider ordering Novolog 4 units TID with meals for meal coverage if patient eats at least 50% of meals.  Thanks, Barnie Alderman, RN, MSN, CDE Diabetes Coordinator Inpatient Diabetes Program [phone] (Team Pager from 8am to 5pm)

## 2019-02-27 NOTE — Progress Notes (Signed)
Pt on room air since 1400, o2 sats WNL at this time. Pt encouraged to use incentive spirometry and ambulate in room as much as possible. Pt verbalized an understanding and tolerating at this time. Will continue to monitor.

## 2019-02-27 NOTE — Progress Notes (Signed)
Updated pt via telephone regarding plan of care and all questions answered.  Will continue to monitor and assess pt   Marda Stalker, [place] Pager [phone] (please enter 7 digits) [place] Pager [phone] (please enter 7 digits)

## 2019-02-28 LAB — GLUCOSE, CAPILLARY
Glucose-Capillary: 172 mg/dL — ABNORMAL HIGH (ref 70–99)
Glucose-Capillary: 169 mg/dL — ABNORMAL HIGH (ref 70–99)
Glucose-Capillary: 169 mg/dL — ABNORMAL HIGH (ref 70–99)
Glucose-Capillary: 231 mg/dL — ABNORMAL HIGH (ref 70–99)

## 2019-02-28 LAB — BASIC METABOLIC PANEL
Anion gap: 10 (ref 5–15)
BUN: 16 mg/dL (ref 6–20)
CO2: 29 mmol/L (ref 22–32)
Calcium: 8.9 mg/dL (ref 8.9–10.3)
Chloride: 99 mmol/L (ref 98–111)
Creatinine, Ser: 1.35 mg/dL — ABNORMAL HIGH (ref 0.61–1.24)
GFR calc Af Amer: >60 mL/min (ref >60)
GFR calc non Af Amer: >60 mL/min (ref >60)
Glucose, Bld: 186 mg/dL — ABNORMAL HIGH (ref 70–99)
Potassium: 4.1 mmol/L (ref 3.5–5.1)
Sodium: 138 mmol/L (ref 135–145)

## 2019-02-28 MED ORDER — INSULIN ASPART 100 UNIT/ML ~~LOC~~ SOLN
0.0000 [IU] | Freq: Every day | SUBCUTANEOUS | Status: DC
  Administered 2019-02-28: 20:00:00 2 [IU] via SUBCUTANEOUS
  Filled 2019-02-28: qty 1

## 2019-02-28 MED ORDER — INSULIN GLARGINE 100 UNIT/ML ~~LOC~~ SOLN
20.0000 [IU] | Freq: Every day | SUBCUTANEOUS | Status: DC
  Administered 2019-02-28: 20 [IU] via SUBCUTANEOUS
  Filled 2019-02-28 (×2): qty 0.2

## 2019-02-28 NOTE — Progress Notes (Signed)
Shift summary: patient still requires 2 L [place] of O2; unable to maintain adequate SPO2 while on RA. Eating/drinking/voiding/ambulating independently. Patient has agreed to stay in-patinet overnight tonight for continued treatment and observation. "Telemetry" status; may transfer out to 2A later today.

## 2019-02-28 NOTE — Progress Notes (Signed)
Patient is floor status.  Likely discharge to home 4/12.  He might require oxygen at the time of discharge.  I have requested that he follow-up with me in 2-3 weeks after discharge.  We will obtain a repeat CXR.  I have recommended that colchicine be continued for another week after discharge.  Merton Border, MD PCCM service Mobile [phone] Pager [phone] 02/28/2019 2:16 PM

## 2019-02-28 NOTE — Plan of Care (Signed)
  Problem: Education: Goal: Knowledge of General Education information will improve Description Including pain rating scale, medication(s)/side effects and non-pharmacologic comfort measures Outcome: Progressing   Problem: Health Behavior/Discharge Planning: Goal: Ability to manage health-related needs will improve Outcome: Progressing   Problem: Activity: Goal: Ability to tolerate increased activity will improve Outcome: Progressing   Problem: Clinical Measurements: Goal: Ability to maintain a body temperature in the normal range will improve Outcome: Progressing   Problem: Respiratory: Goal: Ability to maintain adequate ventilation will improve Outcome: Progressing Goal: Ability to maintain a clear airway will improve Outcome: Progressing

## 2019-02-28 NOTE — Progress Notes (Signed)
Patient on 2L [place], VSS WNL, in no distress. Patient updated about transfer to 2A. All questions answered by this RN and NP. Patient has no concerns at this time.

## 2019-02-28 NOTE — Progress Notes (Signed)
Patient is still requiring oxygen intermittently, on assessment at 2030 patient had [place] on and stated he had it on since around 1800. Patient was able to tolerate RA but would desat at times. Patient was put on BiPAP around 2230 and rested comfortably throughout the night. Patient updated regarding plan of care, work letter given to patient and all questions answered. VSS WNL, afebrile and in no distress. Will continue to monitor.

## 2019-02-28 NOTE — Progress Notes (Signed)
Attempted to call report to 2A X 1; awaiting call back. Patient assigned to room 231.

## 2019-02-28 NOTE — Progress Notes (Addendum)
[facility] [place] NAME: Laurier Jasperson    MR#:  [id number]  DATE OF BIRTH:  1981-07-23  SUBJECTIVE:  CHIEF COMPLAINT:   Chief Complaint  Patient presents with  . Shortness of Breath   Patient dropped his sats to upper 80s on room air requiring supplemental oxygen. He reports mild shortness of breath with exertion.  REVIEW OF SYSTEMS:  Review of Systems  Constitutional: Negative for chills and fever.  HENT: Negative for sore throat.   Respiratory: Positive for shortness of breath. Negative for cough and wheezing.   Cardiovascular: Negative for chest pain and leg swelling.  Gastrointestinal: Negative for abdominal pain, diarrhea, nausea and vomiting.  Genitourinary: Negative for dysuria.  Musculoskeletal: Negative for back pain and myalgias.  Neurological: Negative for dizziness, sensory change, speech change, focal weakness and headaches.   DRUG ALLERGIES:   Allergies  Allergen Reactions  . Fish Allergy Itching and Nausea And Vomiting    VITALS:  Blood pressure 127/85, pulse 89, temperature 98.1 F (36.7 C), temperature source Axillary, resp. rate (!) 28, height 5' 10"  (1.778 m), weight (!) 163.3 kg, SpO2 95 %.  PHYSICAL EXAMINATION:  Physical Exam  GENERAL:  38 y.o.-year-old patient lying in the bed with no acute distress.  EYES: Pupils equal, round, reactive to light and accommodation. No scleral icterus. Extraocular muscles intact.  HEENT: Head atraumatic, normocephalic. Oropharynx and nasopharynx clear.  NECK:  Supple, no jugular venous distention. No thyroid enlargement, no tenderness.  LUNGS: Normal breath sounds bilaterally, no wheezing, rales,rhonchi or crepitation. No use of accessory muscles of respiration.  CARDIOVASCULAR: S1, S2 normal. No murmurs, rubs, or gallops.  ABDOMEN: Soft, nontender, nondistended. Bowel sounds present. No organomegaly or mass.  EXTREMITIES: No pedal edema, cyanosis, or clubbing.   NEUROLOGIC: Cranial nerves II through XII are intact. Muscle strength 5/5 in all extremities. Sensation intact. Gait not checked.  PSYCHIATRIC: The patient is alert and oriented x 3.  SKIN: No obvious rash, lesion, or ulcer.   LABORATORY PANEL:   CBC Recent Labs  Lab 02/27/19 0559  WBC 6.4  HGB 13.0  HCT 39.8  PLT 410*   ------------------------------------------------------------------------------------------------------------------  Chemistries  Recent Labs  Lab 02/26/19 0256  02/27/19 0559 02/28/19 0449  NA 137  --  137 138  K 4.1  --  3.9 4.1  CL 99  --  100 99  CO2 27  --  26 29  GLUCOSE 208*  --  181* 186*  BUN 17  --  16 16  CREATININE 1.53*  --  1.43* 1.35*  CALCIUM 8.6*  --  8.9 8.9  MG 2.4  --   --   --   AST  --    < > 63*  --   ALT  --    < > 76*  --   ALKPHOS  --    < > 71  --   BILITOT  --    < > 0.7  --    < > = values in this interval not displayed.   ------------------------------------------------------------------------------------------------------------------  Cardiac Enzymes No results for input(s): TROPONINI in the last 168 hours. ------------------------------------------------------------------------------------------------------------------  RADIOLOGY:  No results found.  EKG:   Orders placed or performed during the hospital encounter of 02/22/19  . ED EKG  . ED EKG    ASSESSMENT AND PLAN:   38 y.o. male with a history of DM,HTN, OSA Presents to the hospital with sob, cough, fever of a few  days duration. Has tested positive for SARS-CoV-2 (COVID-19).  1. Acute hypoxic resp failure secondary to b/l infiltrates-likely COVID 19 - improved but still requiring supplemental oxygen via nasal cannula.   2. SARS-CoV-2 (COVID-19)-Stable - Treated with hydroxychloroquine , azithromycin ,colchicine - Last dose of Hydroxychloroquine today (02/28/19 )  3.  Hypertension:stable -On nifedipine XL 60 mg   4.  Diabetes mellitus type 2:  -Sliding scale insulin for now -Will restart oral hypoglycemic drugs once blood glucose stable. Had an episode of hypoglycemia  5.  Prophylaxis: DVT- enoxaparin.   All the records are reviewed and case discussed with Care Management/Social Workerr. Management plans discussed with the patient, family and they are in agreement.  CODE STATUS: FULL  TOTAL TIME TAKING CARE OF THIS PATIENT: 35 minutes.   POSSIBLE D/C IN 1 DAYS, DEPENDING ON CLINICAL CONDITION.   Karen Kays NP on 02/28/2019 at 2:10 PM  Between 7am to 6pm - Pager - [phone]  After 6pm go to [email or web address] - password [email or web address] [facility]  Office  [phone]  CC: Primary care physician; System, Pcp Not In

## 2019-02-28 NOTE — Progress Notes (Signed)
SATURATION QUALIFICATIONS: (This note is used to comply with regulatory documentation for home oxygen)  Patient Saturations on Room Air at Rest = 86%  Patient Saturations on Room Air while Ambulating = Did not assess  Patient Saturations on 2 Liters of oxygen while Ambulating = 92%  Please briefly explain why patient needs home oxygen: Patient is COVID-19 positive and unable to maintain adequate SPO2 level while on room air and even at rest.

## 2019-02-28 NOTE — Progress Notes (Signed)
SATURATION QUALIFICATIONS: (This note is used to comply with regulatory documentation for home oxygen)  Patient Saturations on Room Air at Rest = 89%  Patient Saturations on Room Air while Ambulating = 86%  Patient Saturations on 2 Liters of oxygen while Ambulating = 96%  Please briefly explain why patient needs home oxygen:  Ambulation was from bed to bathroom in the room.

## 2019-03-01 LAB — CREATININE, SERUM
Creatinine, Ser: 1.39 mg/dL — ABNORMAL HIGH (ref 0.61–1.24)
GFR calc Af Amer: >60 mL/min (ref >60)
GFR calc non Af Amer: >60 mL/min (ref >60)

## 2019-03-01 LAB — GLUCOSE, CAPILLARY: Glucose-Capillary: 179 mg/dL — ABNORMAL HIGH (ref 70–99)

## 2019-03-01 MED ORDER — COLCHICINE 0.6 MG PO TABS: 0.6000 mg | ORAL_TABLET | Freq: Two times a day (BID) | ORAL | 0 refills | Status: DC

## 2019-03-01 MED ORDER — ASCORBIC ACID 500 MG PO TABS: 500.0000 mg | ORAL_TABLET | Freq: Every day | ORAL | 0 refills | Status: DC

## 2019-03-01 MED ORDER — SENNOSIDES-DOCUSATE SODIUM 8.6-50 MG PO TABS: 1.0000 | ORAL_TABLET | Freq: Every day | ORAL | 0 refills | Status: DC

## 2019-03-01 MED ORDER — NIFEDIPINE ER 60 MG PO TB24: 60.0000 mg | ORAL_TABLET | ORAL | 0 refills | Status: DC

## 2019-03-01 NOTE — Progress Notes (Signed)
Patient is alert and oriented and able to verbalize needs. No c/o pain at this time. VSS. PIV removed. Telemetry discontinued. Printed AVS given to patient in discharge packet. Oxygen delivered to room. All instructions gone over at this time. No concerns voiced. All belongings packed. Patient escorted to car via wc by nurse. Wife to transport patient home.  Bethann Punches, RN

## 2019-03-01 NOTE — TOC Transition Note (Signed)
Transition of Care [facility]) - CM/SW Discharge Note   Patient Details  Name: Jordan Greer MRN: [id number] Date of Birth: November 09, 1981  [facility]) CM/SW Contact:  Latanya Maudlin, RN Phone Number: 03/01/2019, 10:15 AM   Clinical Narrative:   Patient discharging with need for home O2. Patients saturations and positive COVID diagnosis qualify him. Notified Caryl Pina from [place] who will deliver tank to bedside in 1 hour and coordinate home delivery for concentrator, etc with patient on arrival. No further RNCM needs.    Final next level of care: Home/Self Care Barriers to Discharge: Continued Medical Work up   Patient Goals and CMS Choice        Discharge Placement                       Discharge Plan and Services                DME Arranged: Oxygen DME Agency: [facility]       Social Determinants of Health (SDOH) Interventions     Readmission Risk Interventions Readmission Risk Prevention Plan 03/01/2019  Post Dischage Appt Complete  Medication Screening Complete  Transportation Screening Complete  Some recent data might be hidden

## 2019-03-01 NOTE — Discharge Instructions (Signed)
Acute Respiratory Failure, Adult  Acute respiratory failure occurs when there is not enough oxygen passing from your lungs to your body. When this happens, your lungs have trouble removing carbon dioxide from the blood. This causes your blood oxygen level to drop too low as carbon dioxide builds up. Acute respiratory failure is a medical emergency. It can develop quickly, but it is temporary if treated promptly. Your lung capacity, or how much air your lungs can hold, may improve with time, exercise, and treatment. What are the causes? There are many possible causes of acute respiratory failure, including:  Lung injury.  Chest injury or damage to the ribs or tissues near the lungs.  Lung conditions that affect the flow of air and blood into and out of the lungs, such as pneumonia, acute respiratory distress syndrome, and cystic fibrosis.  Medical conditions, such as strokes or spinal cord injuries, that affect the muscles and nerves that control breathing.  Blood infection (sepsis).  Inflammation of the pancreas (pancreatitis).  A blood clot in the lungs (pulmonary embolism).  A large-volume blood transfusion.  Burns.  Near-drowning.  Seizure.  Smoke inhalation.  Reaction to medicines.  Alcohol or drug overdose. What increases the risk? This condition is more likely to develop in people who have:  A blocked airway.  Asthma.  A condition or disease that damages or weakens the muscles, nerves, bones, or tissues that are involved in breathing.  A serious infection.  A health problem that blocks the unconscious reflex that is involved in breathing, such as hypothyroidism or sleep apnea.  A lung injury or trauma. What are the signs or symptoms? Trouble breathing is the main symptom of acute respiratory failure. Symptoms may also include:  Rapid breathing.  Restlessness or anxiety.  Skin, lips, or fingernails that appear blue (cyanosis).  Rapid heart  rate.  Abnormal heart rhythms (arrhythmias).  Confusion or changes in behavior.  Tiredness or loss of energy.  Feeling sleepy or having a loss of consciousness. How is this diagnosed? Your health care provider can diagnose acute respiratory failure with a medical history and physical exam. During the exam, your health care provider will listen to your heart and check for crackling or wheezing sounds in your lungs. Your may also have tests to confirm the diagnosis and determine what is causing respiratory failure. These tests may include:  Measuring the amount of oxygen in your blood (pulse oximetry). The measurement comes from a small device that is placed on your finger, earlobe, or toe.  Other blood tests to measure blood gases and to look for signs of infection.  Sampling your cerebral spinal fluid or tracheal fluid to check for infections.  Chest X-ray to look for fluid in spaces that should be filled with air.  Electrocardiogram (ECG) to look at the heart's electrical activity. How is this treated? Treatment for this condition usually takes places in a hospital intensive care unit (ICU). Treatment depends on what is causing the condition. It may include one or more treatments until your symptoms improve. Treatment may include:  Supplemental oxygen. Extra oxygen is given through a tube in the nose, a face mask, or a hood.  A device such as a continuous positive airway pressure (CPAP) or bi-level positive airway pressure (BiPAP or BPAP) machine. This treatment uses mild air pressure to keep the airways open. A mask or other device will be placed over your nose or mouth. A tube that is connected to a motor will deliver oxygen through  the mask.  Ventilator. This treatment helps move air into and out of the lungs. This may be done with a bag and mask or a machine. For this treatment, a tube is placed in your windpipe (trachea) so air and oxygen can flow to the lungs.  Extracorporeal  membrane oxygenation (ECMO). This treatment temporarily takes over the function of the heart and lungs, supplying oxygen and removing carbon dioxide. ECMO gives the lungs a chance to recover. It may be used if a ventilator is not effective.  Tracheostomy. This is a procedure that creates a hole in the neck to insert a breathing tube.  Receiving fluids and medicines.  Rocking the bed to help breathing. Follow these instructions at home:  Take over-the-counter and prescription medicines only as told by your health care provider.  Return to normal activities as told by your health care provider. Ask your health care provider what activities are safe for you.  Keep all follow-up visits as told by your health care provider. This is important. How is this prevented? Treating infections and medical conditions that may lead to acute respiratory failure can help prevent the condition from developing. Contact a health care provider if:  You have a fever.  Your symptoms do not improve or they get worse. Get help right away if:  You are having trouble breathing.  You lose consciousness.  Your have cyanosis or turn blue.  You develop a rapid heart rate.  You are confused. These symptoms may represent a serious problem that is an emergency. Do not wait to see if the symptoms will go away. Get medical help right away. Call your local emergency services (911 in the [place] Do not drive yourself to the hospital. This information is not intended to replace advice given to you by your health care provider. Make sure you discuss any questions you have with your health care provider. Document Released: 11/10/2013 Document Revised: 06/02/2016 Document Reviewed: 05/23/2016 Elsevier Interactive Patient Education  2019 [place].   Acute Respiratory Failure, Adult  Acute respiratory failure occurs when there is not enough oxygen passing from your lungs to your body. When this happens, your lungs  have trouble removing carbon dioxide from the blood. This causes your blood oxygen level to drop too low as carbon dioxide builds up. Acute respiratory failure is a medical emergency. It can develop quickly, but it is temporary if treated promptly. Your lung capacity, or how much air your lungs can hold, may improve with time, exercise, and treatment. What are the causes? There are many possible causes of acute respiratory failure, including:  Lung injury.  Chest injury or damage to the ribs or tissues near the lungs.  Lung conditions that affect the flow of air and blood into and out of the lungs, such as pneumonia, acute respiratory distress syndrome, and cystic fibrosis.  Medical conditions, such as strokes or spinal cord injuries, that affect the muscles and nerves that control breathing.  Blood infection (sepsis).  Inflammation of the pancreas (pancreatitis).  A blood clot in the lungs (pulmonary embolism).  A large-volume blood transfusion.  Burns.  Near-drowning.  Seizure.  Smoke inhalation.  Reaction to medicines.  Alcohol or drug overdose. What increases the risk? This condition is more likely to develop in people who have:  A blocked airway.  Asthma.  A condition or disease that damages or weakens the muscles, nerves, bones, or tissues that are involved in breathing.  A serious infection.  A health problem that blocks  the unconscious reflex that is involved in breathing, such as hypothyroidism or sleep apnea.  A lung injury or trauma. What are the signs or symptoms? Trouble breathing is the main symptom of acute respiratory failure. Symptoms may also include:  Rapid breathing.  Restlessness or anxiety.  Skin, lips, or fingernails that appear blue (cyanosis).  Rapid heart rate.  Abnormal heart rhythms (arrhythmias).  Confusion or changes in behavior.  Tiredness or loss of energy.  Feeling sleepy or having a loss of consciousness. How is this  diagnosed? Your health care provider can diagnose acute respiratory failure with a medical history and physical exam. During the exam, your health care provider will listen to your heart and check for crackling or wheezing sounds in your lungs. Your may also have tests to confirm the diagnosis and determine what is causing respiratory failure. These tests may include:  Measuring the amount of oxygen in your blood (pulse oximetry). The measurement comes from a small device that is placed on your finger, earlobe, or toe.  Other blood tests to measure blood gases and to look for signs of infection.  Sampling your cerebral spinal fluid or tracheal fluid to check for infections.  Chest X-ray to look for fluid in spaces that should be filled with air.  Electrocardiogram (ECG) to look at the heart's electrical activity. How is this treated? Treatment for this condition usually takes places in a hospital intensive care unit (ICU). Treatment depends on what is causing the condition. It may include one or more treatments until your symptoms improve. Treatment may include:  Supplemental oxygen. Extra oxygen is given through a tube in the nose, a face mask, or a hood.  A device such as a continuous positive airway pressure (CPAP) or bi-level positive airway pressure (BiPAP or BPAP) machine. This treatment uses mild air pressure to keep the airways open. A mask or other device will be placed over your nose or mouth. A tube that is connected to a motor will deliver oxygen through the mask.  Ventilator. This treatment helps move air into and out of the lungs. This may be done with a bag and mask or a machine. For this treatment, a tube is placed in your windpipe (trachea) so air and oxygen can flow to the lungs.  Extracorporeal membrane oxygenation (ECMO). This treatment temporarily takes over the function of the heart and lungs, supplying oxygen and removing carbon dioxide. ECMO gives the lungs a chance to  recover. It may be used if a ventilator is not effective.  Tracheostomy. This is a procedure that creates a hole in the neck to insert a breathing tube.  Receiving fluids and medicines.  Rocking the bed to help breathing. Follow these instructions at home:  Take over-the-counter and prescription medicines only as told by your health care provider.  Return to normal activities as told by your health care provider. Ask your health care provider what activities are safe for you.  Keep all follow-up visits as told by your health care provider. This is important. How is this prevented? Treating infections and medical conditions that may lead to acute respiratory failure can help prevent the condition from developing. Contact a health care provider if:  You have a fever.  Your symptoms do not improve or they get worse. Get help right away if:  You are having trouble breathing.  You lose consciousness.  Your have cyanosis or turn blue.  You develop a rapid heart rate.  You are confused. These symptoms may  represent a serious problem that is an emergency. Do not wait to see if the symptoms will go away. Get medical help right away. Call your local emergency services (911 in the [place] Do not drive yourself to the hospital. This information is not intended to replace advice given to you by your health care provider. Make sure you discuss any questions you have with your health care provider. Document Released: 11/10/2013 Document Revised: 06/02/2016 Document Reviewed: 05/23/2016 Elsevier Interactive Patient Education  2019 [place] Under Monitoring Name: Jordan Greer  Location: [place] [address]   Infection Prevention Recommendations for Individuals Confirmed to have, or Being Evaluated for, 2019 Novel Coronavirus (COVID-19) Infection Who Receive Care at Home  Individuals who are confirmed to have, or are being evaluated for, COVID-19 should  follow the prevention steps below until a healthcare provider or local or state health department says they can return to normal activities.  Stay home except to get medical care You should restrict activities outside your home, except for getting medical care. Do not go to work, school, or public areas, and do not use public transportation or taxis.  Call ahead before visiting your doctor Before your medical appointment, call the healthcare provider and tell them that you have, or are being evaluated for, COVID-19 infection. This will help the healthcare providers office take steps to keep other people from getting infected. Ask your healthcare provider to call the local or state health department.  Monitor your symptoms Seek prompt medical attention if your illness is worsening (e.g., difficulty breathing). Before going to your medical appointment, call the healthcare provider and tell them that you have, or are being evaluated for, COVID-19 infection. Ask your healthcare provider to call the local or state health department.  Wear a facemask You should wear a facemask that covers your nose and mouth when you are in the same room with other people and when you visit a healthcare provider. People who live with or visit you should also wear a facemask while they are in the same room with you.  Separate yourself from other people in your home As much as possible, you should stay in a different room from other people in your home. Also, you should use a separate bathroom, if available.  Avoid sharing household items You should not share dishes, drinking glasses, cups, eating utensils, towels, bedding, or other items with other people in your home. After using these items, you should wash them thoroughly with soap and water.  Cover your coughs and sneezes Cover your mouth and nose with a tissue when you cough or sneeze, or you can cough or sneeze into your sleeve. Throw used tissues in  a lined trash can, and immediately wash your hands with soap and water for at least 20 seconds or use an alcohol-based hand rub.  Wash your [facility] your hands often and thoroughly with soap and water for at least 20 seconds. You can use an alcohol-based hand sanitizer if soap and water are not available and if your hands are not visibly dirty. Avoid touching your eyes, nose, and mouth with unwashed hands.   Prevention Steps for Caregivers and Household Members of Individuals Confirmed to have, or Being Evaluated for, COVID-19 Infection Being Cared for in the Home  If you live with, or provide care at home for, a person confirmed to have, or being evaluated for, COVID-19 infection please follow these guidelines to prevent infection:  Follow  healthcare providers instructions Make sure that you understand and can help the patient follow any healthcare provider instructions for all care.  Provide for the patients basic needs You should help the patient with basic needs in the home and provide support for getting groceries, prescriptions, and other personal needs.  Monitor the patients symptoms If they are getting sicker, call his or her medical provider and tell them that the patient has, or is being evaluated for, COVID-19 infection. This will help the healthcare providers office take steps to keep other people from getting infected. Ask the healthcare provider to call the local or state health department.  Limit the number of people who have contact with the patient  If possible, have only one caregiver for the patient.  Other household members should stay in another home or place of residence. If this is not possible, they should stay  in another room, or be separated from the patient as much as possible. Use a separate bathroom, if available.  Restrict visitors who do not have an essential need to be in the home.  Keep older adults, very young children, and other sick  people away from the patient Keep older adults, very young children, and those who have compromised immune systems or chronic health conditions away from the patient. This includes people with chronic heart, lung, or kidney conditions, diabetes, and cancer.  Ensure good ventilation Make sure that shared spaces in the home have good air flow, such as from an air conditioner or an opened window, weather permitting.  Wash your hands often  Wash your hands often and thoroughly with soap and water for at least 20 seconds. You can use an alcohol based hand sanitizer if soap and water are not available and if your hands are not visibly dirty.  Avoid touching your eyes, nose, and mouth with unwashed hands.  Use disposable paper towels to dry your hands. If not available, use dedicated cloth towels and replace them when they become wet.  Wear a facemask and gloves  Wear a disposable facemask at all times in the room and gloves when you touch or have contact with the patients blood, body fluids, and/or secretions or excretions, such as sweat, saliva, sputum, nasal mucus, vomit, urine, or feces.  Ensure the mask fits over your nose and mouth tightly, and do not touch it during use.  Throw out disposable facemasks and gloves after using them. Do not reuse.  Wash your hands immediately after removing your facemask and gloves.  If your personal clothing becomes contaminated, carefully remove clothing and launder. Wash your hands after handling contaminated clothing.  Place all used disposable facemasks, gloves, and other waste in a lined container before disposing them with other household waste.  Remove gloves and wash your hands immediately after handling these items.  Do not share dishes, glasses, or other household items with the patient  Avoid sharing household items. You should not share dishes, drinking glasses, cups, eating utensils, towels, bedding, or other items with a patient who is  confirmed to have, or being evaluated for, COVID-19 infection.  After the person uses these items, you should wash them thoroughly with soap and water.  Wash laundry thoroughly  Immediately remove and wash clothes or bedding that have blood, body fluids, and/or secretions or excretions, such as sweat, saliva, sputum, nasal mucus, vomit, urine, or feces, on them.  Wear gloves when handling laundry from the patient.  Read and follow directions on labels of laundry or clothing  items and detergent. In general, wash and dry with the warmest temperatures recommended on the label.  Clean all areas the individual has used often  Clean all touchable surfaces, such as counters, tabletops, doorknobs, bathroom fixtures, toilets, phones, keyboards, tablets, and bedside tables, every day. Also, clean any surfaces that may have blood, body fluids, and/or secretions or excretions on them.  Wear gloves when cleaning surfaces the patient has come in contact with.  Use a diluted bleach solution (e.g., dilute bleach with 1 part bleach and 10 parts water) or a household disinfectant with a label that says EPA-registered for coronaviruses. To make a bleach solution at home, add 1 tablespoon of bleach to 1 quart (4 cups) of water. For a larger supply, add  cup of bleach to 1 gallon (16 cups) of water.  Read labels of cleaning products and follow recommendations provided on product labels. Labels contain instructions for safe and effective use of the cleaning product including precautions you should take when applying the product, such as wearing gloves or eye protection and making sure you have good ventilation during use of the product.  Remove gloves and wash hands immediately after cleaning.  Monitor yourself for signs and symptoms of illness Caregivers and household members are considered close contacts, should monitor their health, and will be asked to limit movement outside of the home to the extent  possible. Follow the monitoring steps for close contacts listed on the symptom monitoring form.   ? If you have additional questions, contact your local health department or call the epidemiologist on call at [phone] (available 24/7). ? This guidance is subject to change. For the most up-to-date guidance from [facility], please refer to their website: [email or web address]

## 2019-03-01 NOTE — Discharge Summary (Signed)
[facility] [place] NAME: Jordan Greer    MR#:  [id number]  DATE OF BIRTH:  Jun 09, 1981  DATE OF ADMISSION:  02/22/2019   ADMITTING PHYSICIAN: Saundra Shelling, MD  DATE OF DISCHARGE: 03/01/19  PRIMARY CARE PHYSICIAN: System, Pcp Not In   ADMISSION DIAGNOSIS:   Hypoxia [R09.02] Community acquired pneumonia, unspecified laterality [J18.9]  DISCHARGE DIAGNOSIS:   Principal Problem:   COVID-19 virus infection   SECONDARY DIAGNOSIS:   Past Medical History:  Diagnosis Date  . Colitis   . Colitis   . Diabetes mellitus without complication ([place])   . Hyperlipidemia   . Hypertension     HOSPITAL COURSE:   38 y.o.malewith a history ofDM,HTN, OSA Presents to the hospital  On 02/22/2019 with complaints of  SOB, cough, fever, and diarrhea for 1 week. During hospitalization he tested positive for SARS-CoV-2 (COVID-19).  1. Acute hypoxic resp failure secondary to b/l infiltrates-likely due to SARS-CoV-2 (COVID-19) - Improved but still requiring supplemental oxygen via nasal cannula.  -Follow up with Pulmonary in 2-3 weeks for repeat chest x-ray  2. SARS-CoV-2 (COVID-19)-Stable - Treated with hydroxychloroquine , azithromycin,colchicine - Last dose of Hydroxychloroquine today (02/28/19 ) - Colchicine  Will be continued for 7 days after discharge  3. Hypertension:Was hypontesive during admission, BP remains borderline low will therefore not resume his home BP medications until he follows up with his PCP to make further adjustment. -Continue nifedipine and Nifedipine at discharge  4.Diabetes mellitus type 2: stable -Resume home oral diabetes medications -Follow up with pcp  DISCHARGE CONDITIONS:   Guarded CONSULTS OBTAINED:   1.Infectious disease 2.Pulmonary critical care team  DRUG ALLERGIES:   Allergies  Allergen Reactions  . Fish Allergy Itching and Nausea And Vomiting   DISCHARGE MEDICATIONS:   Allergies as of  03/01/2019      Reactions   Fish Allergy Itching, Nausea And Vomiting      Medication List    STOP taking these medications   amLODipine 10 MG tablet Commonly known as:  NORVASC   furosemide 40 MG tablet Commonly known as:  LASIX   lisinopril 20 MG tablet Commonly known as:  PRINIVIL,ZESTRIL     TAKE these medications   ascorbic acid 500 MG tablet Commonly known as:  VITAMIN C Take 1 tablet (500 mg total) by mouth daily.   atorvastatin 20 MG tablet Commonly known as:  LIPITOR Take 20 mg by mouth daily.   colchicine 0.6 MG tablet Take 1 tablet (0.6 mg total) by mouth 2 (two) times daily for 7 days.   glipiZIDE 10 MG 24 hr tablet Commonly known as:  GLUCOTROL XL Take 10 mg by mouth 2 (two) times daily.   metFORMIN 500 MG 24 hr tablet Commonly known as:  GLUCOPHAGE-XR Take 1,000 mg by mouth 2 (two) times daily.   metoprolol succinate 100 MG 24 hr tablet Commonly known as:  TOPROL-XL Take 100 mg by mouth daily. Take with or immediately following a meal.   NIFEdipine 60 MG 24 hr tablet Commonly known as:  ADALAT CC Take 1 tablet (60 mg total) by mouth daily.   senna-docusate 8.6-50 MG tablet Commonly known as:  Senokot-S Take 1 tablet by mouth at bedtime.        DISCHARGE INSTRUCTIONS:    DIET:   Diabetic diet  ACTIVITY:   Activity as tolerated  OXYGEN:   Home Oxygen: Yes.    Oxygen Delivery: 2 liters/min via Patient connected to nasal cannula  oxygen  DISCHARGE LOCATION:   Home with home health   If you experience worsening of your admission symptoms, develop shortness of breath, life threatening emergency, suicidal or homicidal thoughts you must seek medical attention immediately by calling 911 or calling your MD immediately  if symptoms less severe.  You Must read complete instructions/literature along with all the possible adverse reactions/side effects for all the Medicines you take and that have been prescribed to you. Take any new Medicines  after you have completely understood and accpet all the possible adverse reactions/side effects.   Please note  You were cared for by a hospitalist during your hospital stay. If you have any questions about your discharge medications or the care you received while you were in the hospital after you are discharged, you can call the unit and asked to speak with the hospitalist on call if the hospitalist that took care of you is not available. Once you are discharged, your primary care physician will handle any further medical issues. Please note that NO REFILLS for any discharge medications will be authorized once you are discharged, as it is imperative that you return to your primary care physician (or establish a relationship with a primary care physician if you do not have one) for your aftercare needs so that they can reassess your need for medications and monitor your lab values.    On the day of Discharge:  VITAL SIGNS:   Blood pressure 119/80, pulse 82, temperature 98.3 F (36.8 C), temperature source Oral, resp. rate 18, height 5' 10"  (1.778 m), weight (!) 163.3 kg, SpO2 98 %.  PHYSICAL EXAMINATION:    GENERAL:  38 y.o.-year-old patient lying in the bed with no acute distress.  EYES: Pupils equal, round, reactive to light and accommodation. No scleral icterus. Extraocular muscles intact.  HEENT: Head atraumatic, normocephalic. Oropharynx and nasopharynx clear.  NECK:  Supple, no jugular venous distention. No thyroid enlargement, no tenderness.  LUNGS: Normal breath sounds bilaterally, no wheezing, rales,rhonchi or crepitation. No use of accessory muscles of respiration.  CARDIOVASCULAR: S1, S2 normal. No murmurs, rubs, or gallops.  ABDOMEN: Soft, non-tender, non-distended. Bowel sounds present. No organomegaly or mass.  EXTREMITIES: No pedal edema, cyanosis, or clubbing.  NEUROLOGIC: Cranial nerves II through XII are intact. Muscle strength 5/5 in all extremities. Sensation intact.  Gait not checked.  PSYCHIATRIC: The patient is alert and oriented x 3.  SKIN: No obvious rash, lesion, or ulcer.   DATA REVIEW:   CBC Recent Labs  Lab 02/27/19 0559  WBC 6.4  HGB 13.0  HCT 39.8  PLT 410*    Chemistries  Recent Labs  Lab 02/26/19 0256  02/27/19 0559 02/28/19 0449 03/01/19 0413  NA 137  --  137 138  --   K 4.1  --  3.9 4.1  --   CL 99  --  100 99  --   CO2 27  --  26 29  --   GLUCOSE 208*  --  181* 186*  --   BUN 17  --  16 16  --   CREATININE 1.53*  --  1.43* 1.35* 1.39*  CALCIUM 8.6*  --  8.9 8.9  --   MG 2.4  --   --   --   --   AST  --    < > 63*  --   --   ALT  --    < > 76*  --   --   ALKPHOS  --    < >  71  --   --   BILITOT  --    < > 0.7  --   --    < > = values in this interval not displayed.     Microbiology Results  Results for orders placed or performed during the hospital encounter of 02/22/19  Novel Coronavirus, NAA (hospital order; send-out to ref lab)     Status: Abnormal   Collection Time: 02/22/19 11:47 AM  Result Value Ref Range Status   SARS-CoV-2, NAA DETECTED (A) NOT DETECTED Final    Comment: Positive (Detected) results are indicative of active infection with SARS CoV 2. A positive result does not rule out bacterial infection or coinfection with other viruses. Detection of SARS CoV 2 viral RNA may not indicate that SARS CoV 2 is the causative agent for clinical symptoms. Positive and negative predictive values of testing are highly dependent on prevalence. False positive test results are more likely when prevalence is moderate to low. CRITICAL RESULT CALLED TO, READ BACK BY AND VERIFIED WITH: T RICE RN 02/25/19 0056 JDW (NOTE) The expected result is Negative (Not Detected). The SARS CoV 2 test is intended for the presumptive qualitative  detection of nucleic acid from SARS CoV 2 in upper and lower  respiratory specimens. Testing methodology is real time RT PCR. Test results must be correlated with clinical presentation and   evaluated in the context of other laboratory and epidemiologic data.  Test performance can be affected because the epidemiology and  clinical  spectrum of infection caused by SARS CoV 2 is not fully  known. For example, the optimum types of specimens to collect and  when during the course of infection these specimens are most likely  to contain detectable viral RNA may not be known. This test has not been Food and Drug Administration (FDA) cleared or  approved and has been authorized by FDA under an Emergency Use  Authorization (EUA). The test is only authorized for the duration of  the declaration that circumstances exist justifying the authorization  of emergency use of in vitro diagnostic tests for detection and or  diagnosis of SARS CoV 2 under Section 564(b)(1) of the Act, 21 U.S.C.  section [phone] 3(b)(1), unless the authorization is terminated or  revoked sooner. [facility] is certified under the  Clinical Laboratory Improvement Amendments of 1988 (CLIA), 42 U.S.C.  section [phone], to perform high complexity tests. Performed at [facility] [id number], [place], [place] [address] Laboratory Director: Loleta Books, MD Fact Sheet for Healthcare Providers  BankingDealers.co.za Fact Sheet for Patients  StrictlyIdeas.no Performed at [facility], [place] [address] [place], [place] [address]    Coronavirus Source NASOPHARYNGEAL  Final    Comment: Performed at [facility], [address] [place], [place] [address]  Respiratory Panel by PCR     Status: None   Collection Time: 02/22/19 11:47 AM  Result Value Ref Range Status   Adenovirus NOT DETECTED NOT DETECTED Final   Coronavirus 229E NOT DETECTED NOT DETECTED Final    Comment: (NOTE) The Coronavirus on the Respiratory Panel, DOES NOT test for the novel  Coronavirus (2019 nCoV)     Coronavirus HKU1 NOT DETECTED NOT DETECTED Final   Coronavirus NL63 NOT DETECTED NOT DETECTED Final   Coronavirus OC43 NOT DETECTED NOT DETECTED Final   Metapneumovirus NOT DETECTED NOT DETECTED Final   Rhinovirus / Enterovirus NOT DETECTED NOT DETECTED Final  Influenza A NOT DETECTED NOT DETECTED Final   Influenza B NOT DETECTED NOT DETECTED Final   Parainfluenza Virus 1 NOT DETECTED NOT DETECTED Final   Parainfluenza Virus 2 NOT DETECTED NOT DETECTED Final   Parainfluenza Virus 3 NOT DETECTED NOT DETECTED Final   Parainfluenza Virus 4 NOT DETECTED NOT DETECTED Final   Respiratory Syncytial Virus NOT DETECTED NOT DETECTED Final   Bordetella pertussis NOT DETECTED NOT DETECTED Final   Chlamydophila pneumoniae NOT DETECTED NOT DETECTED Final   Mycoplasma pneumoniae NOT DETECTED NOT DETECTED Final    Comment: Performed at [facility], [place] [address], [place], [place] [address]  MRSA PCR Screening     Status: None   Collection Time: 02/24/19  3:28 PM  Result Value Ref Range Status   MRSA by PCR NEGATIVE NEGATIVE Final    Comment:        The GeneXpert MRSA Assay (FDA approved for NASAL specimens only), is one component of a comprehensive MRSA colonization surveillance program. It is not intended to diagnose MRSA infection nor to guide or monitor treatment for MRSA infections. Performed at [facility], [address] [place], [place] [address]     RADIOLOGY:  No results found.   Management plans discussed with the patient, family and they are in agreement.  CODE STATUS:     Code Status Orders  (From admission, onward)         Start     Ordered   02/22/19 1320  Full code  Continuous     02/22/19 1319        Code Status History    This patient has a current code status but no historical code status.      TOTAL TIME TAKING CARE OF THIS PATIENT: 38 minutes.   This patient was staffed with Dr. Atha Starks, Manuella Ghazi who personally evaluated patient,  reviewed documentation and agreed with assessment and discharge plan as above.  Rufina Falco, DNP, FNP-BC Hospitalist Nurse Practitioner   03/01/2019 at 8:39 AM  Between 7am to 6pm - Pager - [phone]  After 6pm go to [email or web address] - Proofreader  [facility]  Office  [phone]  CC: Primary care physician; System, Pcp Not In   Note: This dictation was prepared with Dragon dictation along with smaller phrase technology. Any transcriptional errors that result from this process are unintentional.

## 2019-03-04 ENCOUNTER — Telehealth: Payer: Self-pay | Admitting: *Deleted

## 2019-03-04 NOTE — Telephone Encounter (Signed)
Wife Jordan Greer called the floor asking when home health would be out to check on patient.  Patient was discharged over the weekend.  Looked back through notes and orders and contacted the PA that discharged him.  PA stated that he did not need home health services; only oxygen which he was discharged with.  Asked Jordan Greer if she felt he need a nurse to come to the home and she said no but that his nurse while in the ICU stated a home health nurse would be out to see him.  She states he is tired but getting around fine and doesn't feel he needs home health.

## 2019-03-06 ENCOUNTER — Telehealth: Payer: Self-pay | Admitting: Pulmonary Disease

## 2019-03-06 NOTE — Telephone Encounter (Signed)
Call returned to patient, made aware the letter has been generated and signed. Patient request that we send the letter to his email. Made aware we would also mail the signed copy. Voiced understanding. Nothing further is needed at this time.

## 2019-03-06 NOTE — Telephone Encounter (Signed)
Call returned to patient, made aware we have sent his message to a provider and we get back with him once we here back. Patient is requesting that we email him the letter to  [email or web address].

## 2019-03-06 NOTE — Telephone Encounter (Signed)
Pt is calling for an update on letter. Cb is [phone].

## 2019-03-06 NOTE — Telephone Encounter (Signed)
Dr. Darnell Level, please advise. Can we write a note for patient to stay out of work. If so when would you recommend he return?

## 2019-03-16 ENCOUNTER — Telehealth: Payer: Self-pay

## 2019-03-16 ENCOUNTER — Encounter: Payer: Self-pay | Admitting: Pulmonary Disease

## 2019-03-16 ENCOUNTER — Ambulatory Visit
Admission: RE | Admit: 2019-03-16 | Discharge: 2019-03-16 | Disposition: A | Discharge status: Home / Self Care | Payer: Federal, State, Local not specified - PPO | Source: Ambulatory Visit | Attending: Pulmonary Disease | Admitting: Pulmonary Disease

## 2019-03-16 ENCOUNTER — Ambulatory Visit (INDEPENDENT_AMBULATORY_CARE_PROVIDER_SITE_OTHER): Payer: Federal, State, Local not specified - PPO | Admitting: Pulmonary Disease

## 2019-03-16 ENCOUNTER — Inpatient Hospital Stay: Admit: 2019-03-16 | Payer: Self-pay | Source: Ambulatory Visit

## 2019-03-16 ENCOUNTER — Other Ambulatory Visit: Payer: Self-pay

## 2019-03-16 DIAGNOSIS — J1289 Other viral pneumonia: Secondary | ICD-10-CM | POA: Insufficient documentation

## 2019-03-16 DIAGNOSIS — G4733 Obstructive sleep apnea (adult) (pediatric): Secondary | ICD-10-CM | POA: Diagnosis not present

## 2019-03-16 NOTE — Telephone Encounter (Signed)
Work note generated, emailed to patient, and original mailed to patient. Nothing further needed at this time.

## 2019-03-16 NOTE — Patient Instructions (Signed)
CXR today Home sleep study ordered We will call home supply company to have oxygen removed from your home Continue CPAP Upon follow-up, bring CPAP machine with you for [place] to check on the settings We discussed weight loss and dietary strategies  I recommended a low carbohydrate strategy You may return to work end of May/1 June Follow-up in this office in 2-3 months.  Call sooner if needed

## 2019-03-21 NOTE — Progress Notes (Signed)
PULMONARY OFFICE FOLLOW UP NOTE  PT PROFILE: 38 y.o. male never smoker hospitalized 4/5-4/12/20 with SARS-CoV 19 pneumonia. Never intubated. Discharged on home O2 @ 2 LPM. Also has history of DM, Htn, OSA  DATA:   SUBJ: This is a scheduled post hospital follow up. He has gradually improved towards baseline since discharge. He is no longer using O2. Denies CP, fever, purulent sputum, hemoptysis, LE edema and calf tenderness.    Past Medical History:  Diagnosis Date  . Colitis   . Colitis   . Diabetes mellitus without complication ([place])   . Hyperlipidemia   . Hypertension     Past Surgical History:  Procedure Laterality Date  . none      MEDICATIONS: I have reviewed all medications and confirmed regimen as documented  Social History   Socioeconomic History  . Marital status: Married    Spouse name: Not on file  . Number of children: Not on file  . Years of education: Not on file  . Highest education level: Not on file  Occupational History  . Not on file  Social Needs  . Financial resource strain: Not on file  . Food insecurity:    Worry: Not on file    Inability: Not on file  . Transportation needs:    Medical: Not on file    Non-medical: Not on file  Tobacco Use  . Smoking status: Never Smoker  . Smokeless tobacco: Never Used  Substance and Sexual Activity  . Alcohol use: Yes    Alcohol/week: 18.0 standard drinks    Types: 18 Cans of beer per week  . Drug use: No  . Sexual activity: Yes  Lifestyle  . Physical activity:    Days per week: Not on file    Minutes per session: Not on file  . Stress: Not on file  Relationships  . Social connections:    Talks on phone: Not on file    Gets together: Not on file    Attends religious service: Not on file    Active member of club or organization: Not on file    Attends meetings of clubs or organizations: Not on file    Relationship status: Not on file  . Intimate partner violence:    Fear of current or ex  partner: Not on file    Emotionally abused: Not on file    Physically abused: Not on file    Forced sexual activity: Not on file  Other Topics Concern  . Not on file  Social History Narrative  . Not on file    Family History  Problem Relation Age of Onset  . Hypertension Mother   . Diabetes Mellitus II Mother     ROS: No fever, myalgias/arthralgias, unexplained weight loss or weight gain No new focal weakness or sensory deficits No otalgia, hearing loss, visual changes, nasal and sinus symptoms, mouth and throat problems No neck pain or adenopathy No abdominal pain, N/V/D, diarrhea, change in bowel pattern No dysuria, change in urinary pattern  OBJ: Vitals:   03/16/19 1331  BP: (!) 152/100  Pulse: (!) 111  SpO2: 99%  Weight: (!) 352 lb 6.4 oz (159.8 kg)  Height: 5' 10"  (1.778 m)     EXAM:  Gen: Obese, No overt respiratory distress HEENT: NCAT, sclera white, oropharynx normal Neck: Supple without LAN, thyromegaly, JVD Lungs: breath sounds full, percussion normal, adventitious sounds: none Cardiovascular: RRR, no murmurs noted Abdomen: Soft, nontender, normal BS Ext: without clubbing, cyanosis, edema Neuro:  CNs grossly intact, motor and sensory intact Skin: Limited exam, no lesions noted  DATA:   BMP Latest Ref Rng & Units 03/01/2019 02/28/2019 02/27/2019  Glucose 70 - 99 mg/dL - 186(H) 181(H)  BUN 6 - 20 mg/dL - 16 16  Creatinine 0.61 - 1.24 mg/dL 1.39(H) 1.35(H) 1.43(H)  Sodium 135 - 145 mmol/L - 138 137  Potassium 3.5 - 5.1 mmol/L - 4.1 3.9  Chloride 98 - 111 mmol/L - 99 100  CO2 22 - 32 mmol/L - 29 26  Calcium 8.9 - 10.3 mg/dL - 8.9 8.9    CBC Latest Ref Rng & Units 02/27/2019 02/26/2019 02/25/2019  WBC 4.0 - 10.5 K/uL 6.4 5.6 5.6  Hemoglobin 13.0 - 17.0 g/dL 13.0 13.3 13.1  Hematocrit 39.0 - 52.0 % 39.8 40.0 40.1  Platelets 150 - 400 K/uL 410(H) 316 249    CXR 04/27: NOACPD   I have personally reviewed all chest radiographs reported above including CXRs  and CT chest unless otherwise indicated  IMPRESSION:     ICD-10-CM   1. Pneumonia due to COVID-19 virus U07.1 DG Chest 2 View   J12.89 Ambulatory Referral for DME  2. Severe obesity E66.01   3. OSA (obstructive sleep apnea) G47.33 Home sleep test   We discussed how his chronic medical conditions of obesity, Htn, DM increased his risk of serious COVID 19 illness. We discussed the ongoing risks of these conditions. We also discussed the potential risks of returning to work. It is not presently clear that recovery from [place] confers protection against re-infection. He expressed concern re: returning to work too soon and worries about bringing the infection home to his wife and children.   He uses CPAP compliantly, owns his own machine, doesn't know the pressure setting and has gained significant weight since the original diagnosis.   PLAN:  CXR ordered on this day - reviewed above  Home sleep study ordered  We will contact home supply company to have O2 removed from his home  Cont current CPAP for now  Upon follow up he is to bring his CPAP machine for interrogation   We discussed importance of weight loss and dietary strategies  He may return to work June 1  Follow up in 2-3 months or sooner as needed   Merton Border, MD PCCM service Mobile [phone] Pager [phone] 03/21/2019 2:43 PM

## 2019-04-14 ENCOUNTER — Telehealth: Payer: Self-pay

## 2019-04-14 NOTE — Telephone Encounter (Signed)
LM for patient to call and schedule apt.   DS, please advise if you need to see him back before he starts work again. Also, please check CXR results.

## 2019-04-14 NOTE — Telephone Encounter (Signed)
Called and spoke to pt, who is requesting to schedule 2-35mof/u. Pt has been scheduled for f/u 06/04/2019 at 10:30a. Nothing further is needed.

## 2019-04-14 NOTE — Telephone Encounter (Signed)
Patient is returning phone call.  Patient phone number is [phone].

## 2019-04-15 NOTE — Telephone Encounter (Signed)
I do not need to see him before he returns to work. His CXR was entirely normal  Thanks  Waunita Schooner

## 2019-06-04 ENCOUNTER — Ambulatory Visit: Payer: Federal, State, Local not specified - PPO | Admitting: Pulmonary Disease

## 2019-06-12 ENCOUNTER — Ambulatory Visit (INDEPENDENT_AMBULATORY_CARE_PROVIDER_SITE_OTHER): Payer: Federal, State, Local not specified - PPO | Admitting: Pulmonary Disease

## 2019-06-12 ENCOUNTER — Encounter: Payer: Self-pay | Admitting: Pulmonary Disease

## 2019-06-12 ENCOUNTER — Other Ambulatory Visit: Payer: Self-pay

## 2019-06-12 VITALS — BP 160/88 | HR 101 | Temp 98.1°F | Ht 69.0 in | Wt 355.8 lb

## 2019-06-12 DIAGNOSIS — G4719 Other hypersomnia: Secondary | ICD-10-CM

## 2019-06-12 DIAGNOSIS — G4710 Hypersomnia, unspecified: Secondary | ICD-10-CM

## 2019-06-12 DIAGNOSIS — U071 COVID-19: Secondary | ICD-10-CM | POA: Diagnosis not present

## 2019-06-12 DIAGNOSIS — R0683 Snoring: Secondary | ICD-10-CM

## 2019-06-12 NOTE — Patient Instructions (Signed)
We will reschedule home sleep study and contact you regarding results.  Most likely, based on the results, we will recommend initiating CPAP therapy.  Follow-up will be arranged after that.

## 2019-06-12 NOTE — Progress Notes (Signed)
PULMONARY OFFICE FOLLOW UP NOTE  PT PROFILE: 38 y.o. male never smoker hospitalized 4/5-4/12/20 with SARS-CoV 19 pneumonia. Never intubated. Discharged on home O2 @ 2 LPM. Also has history of DM, Htn, OSA  DATA:   SUBJ: This is a scheduled follow-up.  He continues to recover from his recent COVID-19 pulmonary infection.  He started back at work in early June.  He is working overtime with a good amount of stress.  Nonetheless, he is able to do his duties at work.  He did not get sleep study performed as I previously ordered.  He reports that there was difficulty getting it scheduled due to coronavirus pandemic.  He has no new complaints.  He has minimal cough and scant mucus.  He denies fever, purulent sputum, hemoptysis, chest pain, lower extremity edema, calf tenderness.   OBJ: Vitals:   06/12/19 0839  BP: (!) 160/88  Pulse: (!) 101  Temp: 98.1 F (36.7 C)  TempSrc: Oral  SpO2: 97%  Weight: (!) 355 lb 12.8 oz (161.4 kg)  Height: 5' 9"  (1.753 m)  Room air  EXAM:  Gen: Obese, NAD HEENT: NCAT, sclerae white Neck: Thick neck, JVP cannot be visualized Lungs: breath sounds full, no wheezes or other adventitious sounds Cardiovascular: RRR, no murmurs Abdomen: Soft, nontender, normal BS Ext: without clubbing, cyanosis, edema Neuro: grossly intact Skin: Limited exam, no lesions noted   DATA:   BMP Latest Ref Rng & Units 03/01/2019 02/28/2019 02/27/2019  Glucose 70 - 99 mg/dL - 186(H) 181(H)  BUN 6 - 20 mg/dL - 16 16  Creatinine 0.61 - 1.24 mg/dL 1.39(H) 1.35(H) 1.43(H)  Sodium 135 - 145 mmol/L - 138 137  Potassium 3.5 - 5.1 mmol/L - 4.1 3.9  Chloride 98 - 111 mmol/L - 99 100  CO2 22 - 32 mmol/L - 29 26  Calcium 8.9 - 10.3 mg/dL - 8.9 8.9    CBC Latest Ref Rng & Units 02/27/2019 02/26/2019 02/25/2019  WBC 4.0 - 10.5 K/uL 6.4 5.6 5.6  Hemoglobin 13.0 - 17.0 g/dL 13.0 13.3 13.1  Hematocrit 39.0 - 52.0 % 39.8 40.0 40.1  Platelets 150 - 400 K/uL 410(H) 316 249    CXR: No new  film  I have personally reviewed all chest radiographs reported above including CXRs and CT chest unless otherwise indicated  IMPRESSION:     ICD-10-CM   1. Daytime hypersomnia  G47.19   2. Snoring  R06.83   3. Morbid obesity ([place])  E66.01   4. Recent COVID-19 virus infection  U07.1    I again discussed the importance of weight loss as it pertains to his chronic medical problems (diabetes, hypertension and probable OSA).  We discussed dietary strategies   PLAN:  We will reschedule home sleep study which I think is a very important test to get completed  We will contact him with results and (assuming the study demonstrates OSA) initiate CPAP therapy  Follow-up to be arranged after that   Merton Border, MD PCCM service Mobile [phone] Pager [phone] 06/12/2019 1:54 PM

## 2019-06-26 ENCOUNTER — Other Ambulatory Visit: Payer: Self-pay

## 2019-06-26 ENCOUNTER — Ambulatory Visit: Payer: Federal, State, Local not specified - PPO

## 2019-06-26 DIAGNOSIS — G4733 Obstructive sleep apnea (adult) (pediatric): Secondary | ICD-10-CM

## 2019-07-02 ENCOUNTER — Telehealth: Payer: Self-pay | Admitting: Pulmonary Disease

## 2019-07-02 DIAGNOSIS — G4733 Obstructive sleep apnea (adult) (pediatric): Secondary | ICD-10-CM | POA: Diagnosis not present

## 2019-07-02 NOTE — Telephone Encounter (Signed)
Pt notified sleep study showed severe OSA and it is recommended that he start auto cpap with pressure range of 5-20 cm H2O. Once he is started on cpap it is recommended that he have an ONO on cpap. Pt aware that he will need to call for follow up appt. To be seen 31-90 days after starting cpap. Pt wishes to proceed orders have been placed. Appt. Was also scheduled for pt to be evaluated for obesity hypoventilation syndrome.No further actions necessary.

## 2019-07-23 ENCOUNTER — Telehealth: Payer: Self-pay | Admitting: Pulmonary Disease

## 2019-07-23 NOTE — Telephone Encounter (Signed)
Called and spoke to pt, regarding cpap setup.  Pt stated that he has not setup on cpap as of yet, as he wished to discuss with his PCP prior to starting cpap.  Pt requested that 07/24/2019 appt be canceled. Pt will call back to reschedule.  Nothing further is needed at this time.

## 2019-07-24 ENCOUNTER — Ambulatory Visit: Payer: Federal, State, Local not specified - PPO | Admitting: Pulmonary Disease

## 2019-08-04 ENCOUNTER — Telehealth: Payer: Self-pay | Admitting: Pulmonary Disease

## 2019-08-04 NOTE — Telephone Encounter (Signed)
Pt advised that he can pick up a copy of his test at the front desk. Pt will schedule appt. While he is here picking up report.

## 2019-09-03 ENCOUNTER — Encounter: Payer: Self-pay | Admitting: Internal Medicine

## 2019-09-03 ENCOUNTER — Other Ambulatory Visit: Payer: Self-pay

## 2019-09-03 ENCOUNTER — Ambulatory Visit (INDEPENDENT_AMBULATORY_CARE_PROVIDER_SITE_OTHER): Payer: Federal, State, Local not specified - PPO | Admitting: Internal Medicine

## 2019-09-03 DIAGNOSIS — G4733 Obstructive sleep apnea (adult) (pediatric): Secondary | ICD-10-CM

## 2019-09-03 NOTE — Patient Instructions (Signed)
START AUTOCPAP 5-20 cm h20 ASAP

## 2019-09-03 NOTE — Addendum Note (Signed)
Addended by: Vivia Ewing on: 09/03/2019 12:21 PM   Modules accepted: Orders

## 2019-09-03 NOTE — Progress Notes (Signed)
PULMONARY OFFICE FOLLOW UP NOTE   I connected with the patient by telephone enabled telemedicine visit and verified that I am speaking with the correct person using two identifiers.    I discussed the limitations, risks, security and privacy concerns of performing an evaluation and management service by telemedicine and the availability of in-person appointments. I also discussed with the patient that there may be a patient responsible charge related to this service. The patient expressed understanding and agreed to proceed.  PATIENT AGREES AND CONFIRMS -YES   Other persons participating in the visit and their role in the encounter: Patient, nursing  This visit type was conducted due to national recommendations for restrictions regarding the COVID-19 Pandemic (e.g. social distancing).  This format is felt to be most appropriate for this patient at this time.  All issues noted in this document were discussed and addressed.      DATA: sleep study 06/2019 AHI 98 689 desaturations    PT PROFILE: 38 y.o. male never smoker hospitalized 4/5-4/12/20 with SARS-CoV 19 pneumonia. Never intubated. Discharged on home O2 @ 2 LPM. Also has history of DM, Htn, OSA  CC  follow up recent COVID infection  HPI Recent COVID infection taken off oxygen 2 months ago No other resp issues at this time.   Severe OSA AHI 98 Sleep study results discussed with patient  Patient is morbidly obese Patient weighs 355 pounds   No evidence of heart failure at this time No evidence or signs of infection at this time No respiratory distress No fevers, chills, nausea, vomiting, diarrhea No evidence of lower extremity edema No evidence hemoptysis    Review of Systems:  Gen:  Denies  fever, sweats, chills weight loss  HEENT: Denies blurred vision, double vision, ear pain, eye pain, hearing loss, nose bleeds, sore throat Cardiac:  No dizziness, chest pain or heaviness, chest tightness,edema, No JVD Resp:    No cough, -sputum production, -shortness of breath,-wheezing, -hemoptysis,  Gi: Denies swallowing difficulty, stomach pain, nausea or vomiting, diarrhea, constipation, bowel incontinence Gu:  Denies bladder incontinence, burning urine Ext:   Denies Joint pain, stiffness or swelling Skin: Denies  skin rash, easy bruising or bleeding or hives Endoc:  Denies polyuria, polydipsia , polyphagia or weight change Psych:   Denies depression, insomnia or hallucinations  Other:  All other systems negative    DATA:   BMP Latest Ref Rng & Units 03/01/2019 02/28/2019 02/27/2019  Glucose 70 - 99 mg/dL - 186(H) 181(H)  BUN 6 - 20 mg/dL - 16 16  Creatinine 0.61 - 1.24 mg/dL 1.39(H) 1.35(H) 1.43(H)  Sodium 135 - 145 mmol/L - 138 137  Potassium 3.5 - 5.1 mmol/L - 4.1 3.9  Chloride 98 - 111 mmol/L - 99 100  CO2 22 - 32 mmol/L - 29 26  Calcium 8.9 - 10.3 mg/dL - 8.9 8.9    CBC Latest Ref Rng & Units 02/27/2019 02/26/2019 02/25/2019  WBC 4.0 - 10.5 K/uL 6.4 5.6 5.6  Hemoglobin 13.0 - 17.0 g/dL 13.0 13.3 13.1  Hematocrit 39.0 - 52.0 % 39.8 40.0 40.1  Platelets 150 - 400 K/uL 410(H) 316 249     ASSESSMENT/PLAN  SEVERE OSA Patient needs to START AUTOCPAP 5-20 CM h20 ASAP  Obesity -recommend significant weight loss -recommend changing diet  Deconditioned state -Recommend increased daily activity and exercise   COVID 19 infection-Hypoxia -symptoms and hypoxia resolved with time  COVID-19 EDUCATION: The signs and symptoms of COVID-19 were discussed with the patient and how to  seek care for testing.  The importance of social distancing was discussed today. Hand Washing Techniques and avoid touching face was advised.     MEDICATION ADJUSTMENTS/LABS AND TESTS ORDERED: START AUTOCPAP 5-20 cm h20 ASAP    CURRENT MEDICATIONS REVIEWED AT LENGTH WITH PATIENT TODAY   Patient satisfied with Plan of action and management. All questions answered  Follow up in 3 months  Total time spent 23  mins   Maretta Bees Patricia Pesa, M.D.  [facility]  Medical Director [place] Director [facility] Cardio-Pulmonary Department

## 2020-03-13 IMAGING — DX PORTABLE CHEST - 1 VIEW
1 series · 1 of 1 positions shown · non-contrast
Comparison: None.

CLINICAL DATA: Shortness of breath, dyspnea

EXAM:
PORTABLE CHEST 1 VIEW

[chest ap]
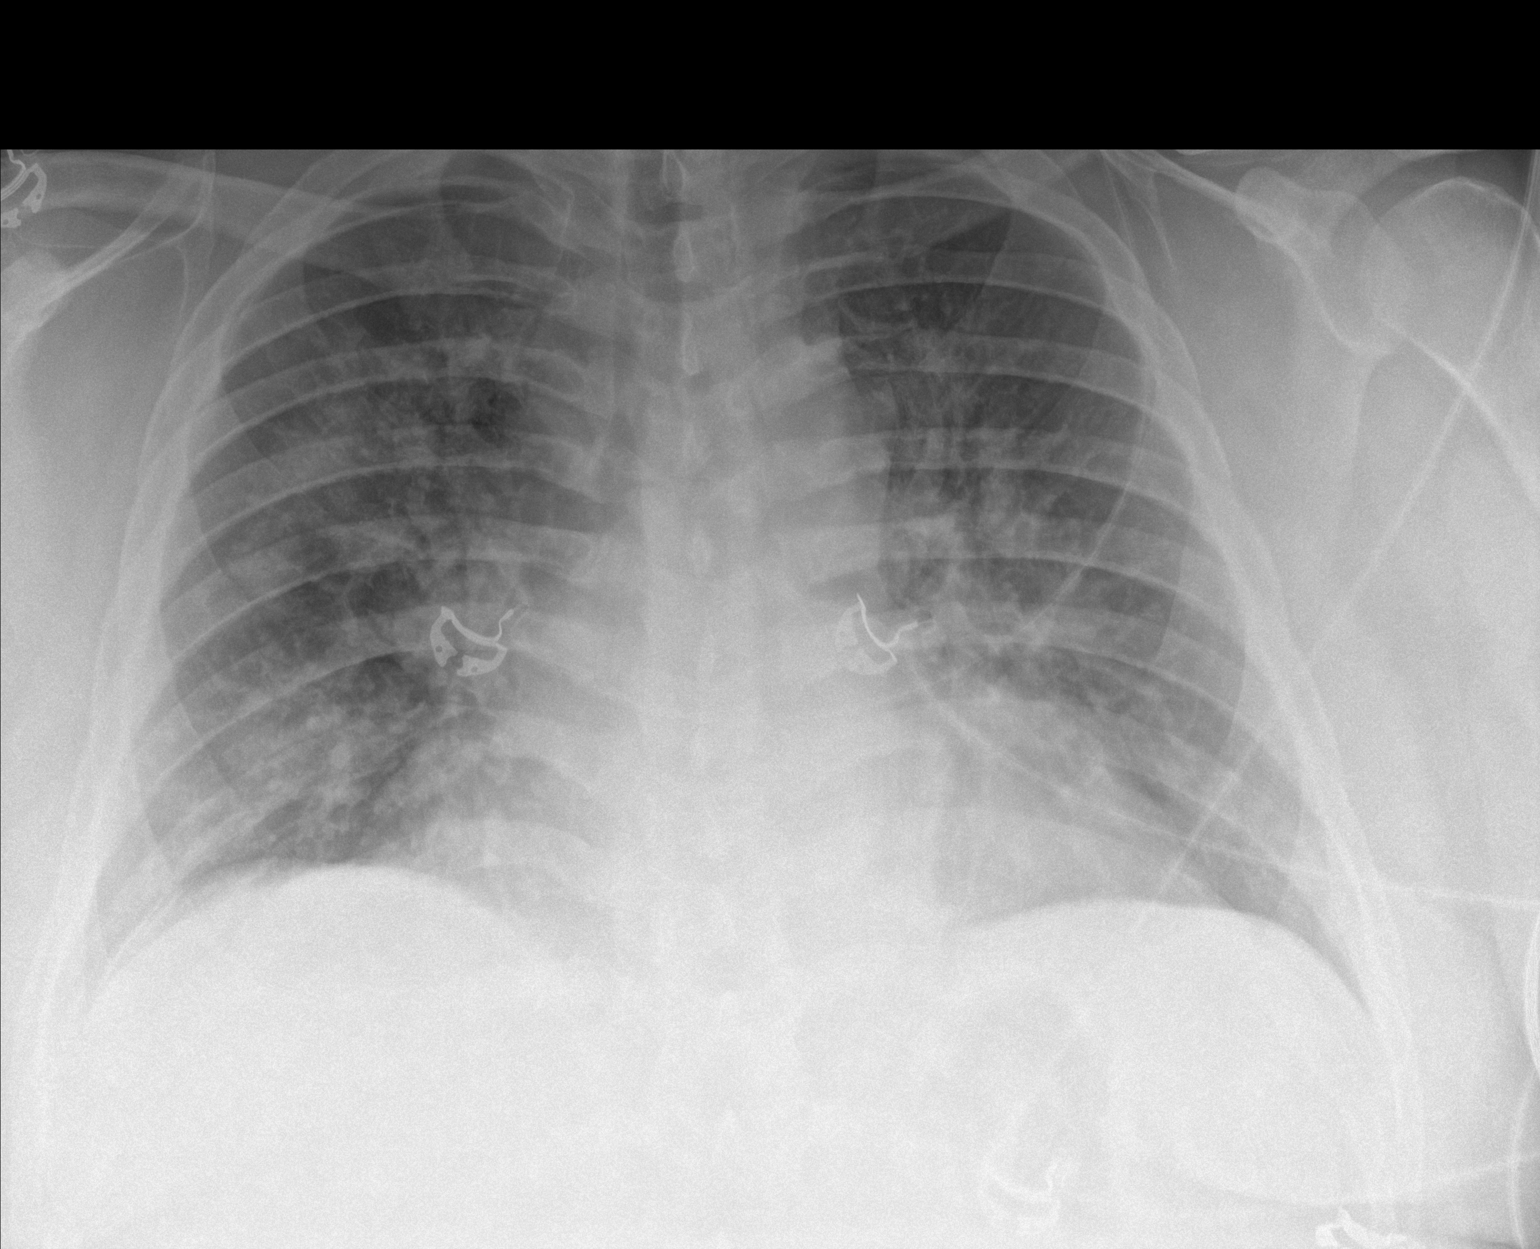

[1 of 1 positions shown; findings below may reference images not displayed]

FINDINGS: There are patchy areas of airspace disease bilaterally concerning
for multilobar pneumonia. There is no pleural effusion or
pneumothorax. The heart and mediastinal contours are unremarkable.

The osseous structures are unremarkable.
IMPRESSION: Patchy areas of airspace disease bilaterally concerning for
multilobar pneumonia.

## 2020-03-17 IMAGING — DX PORTABLE CHEST - 1 VIEW
1 series · 1 of 1 positions shown · non-contrast
Comparison: Yesterday

CLINICAL DATA: Acute respiratory failure

EXAM:
PORTABLE CHEST 1 VIEW

[chest ap]
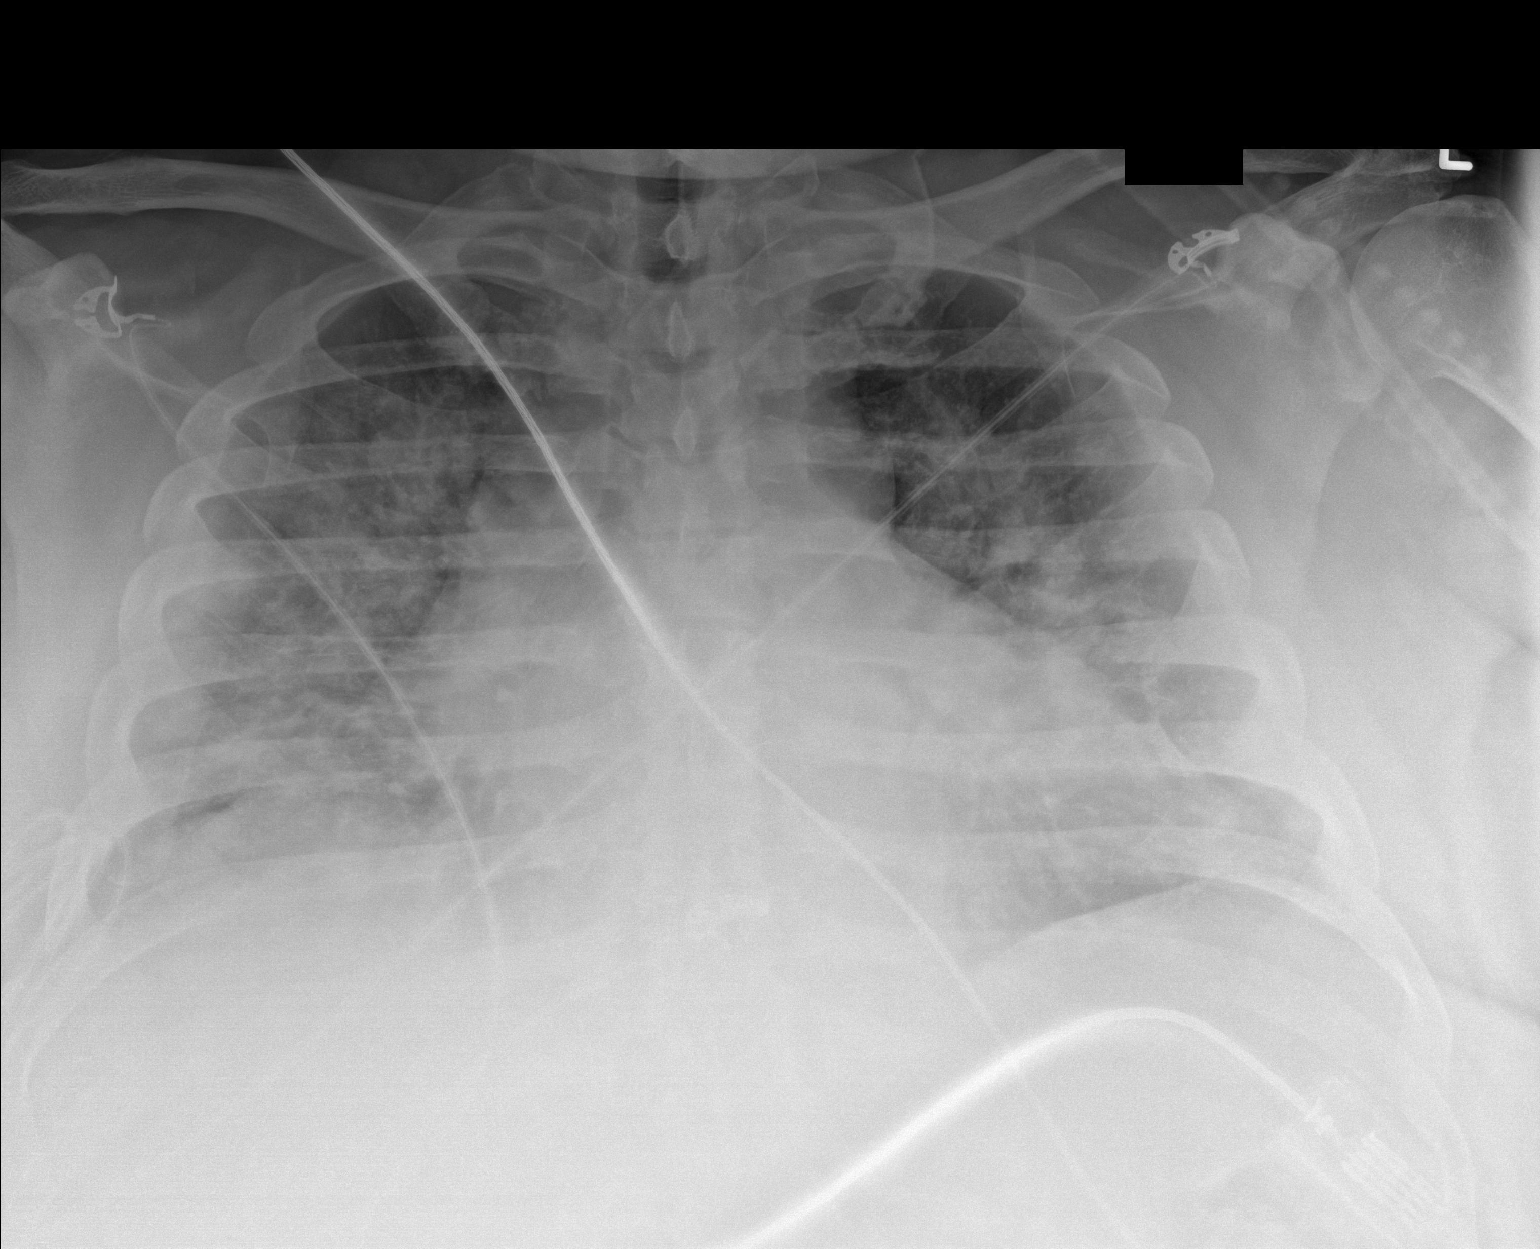

[1 of 1 positions shown; findings below may reference images not displayed]

FINDINGS: Stable bilateral airspace disease. Low lung volumes and
cardiomegaly. No effusion or pneumothorax
IMPRESSION: Stable low volume chest with bilateral airspace disease.

## 2020-04-04 IMAGING — CR CHEST - 2 VIEW
1 series · 2 of 2 positions shown · non-contrast
Comparison: 02/26/2019

CLINICAL DATA: Follow-up pneumonia

EXAM:
CHEST - 2 VIEW

[Series 1: dg chest 2 view · 0.14mm/px · 2 of 2 slices shown]
[im 1/2]
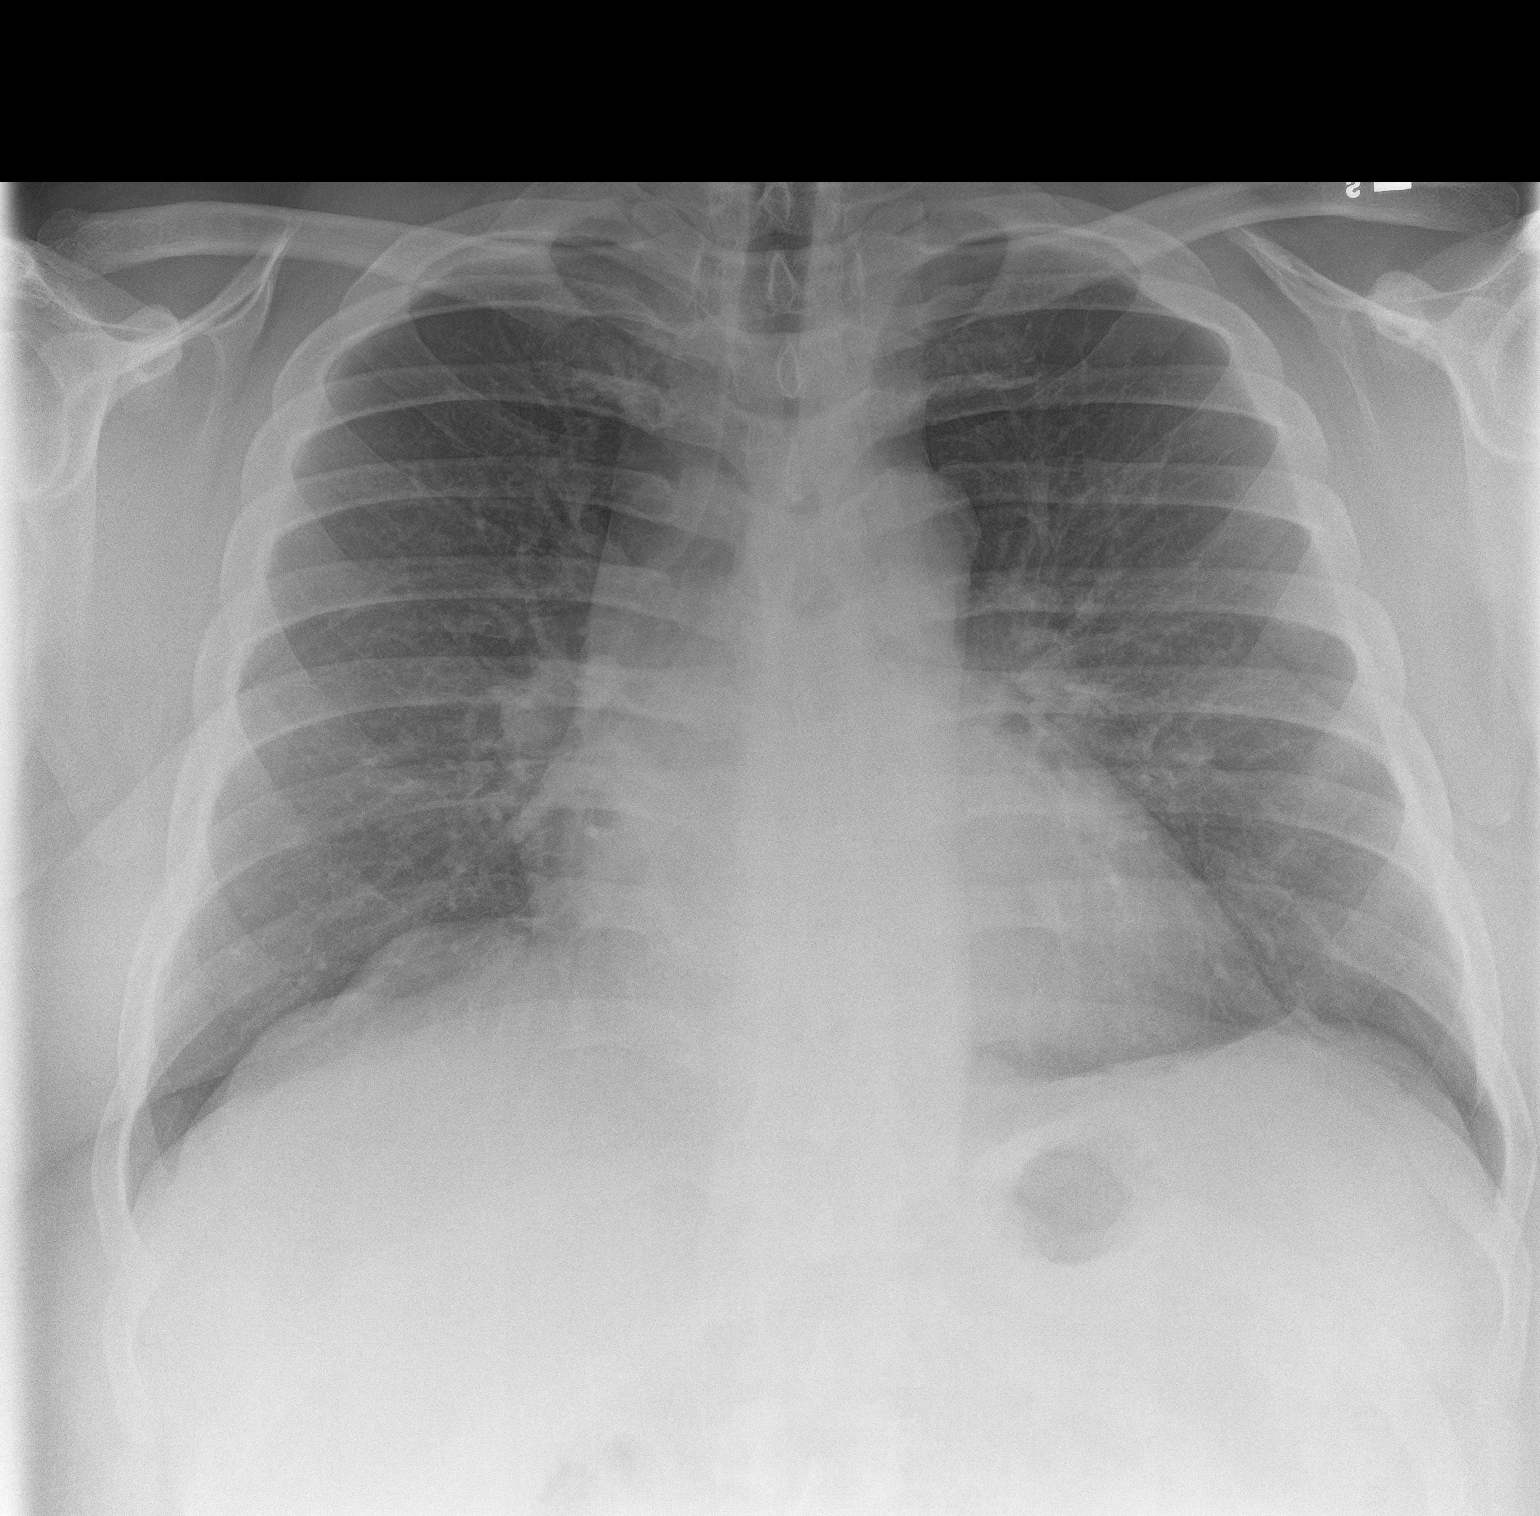
[im 2/2]
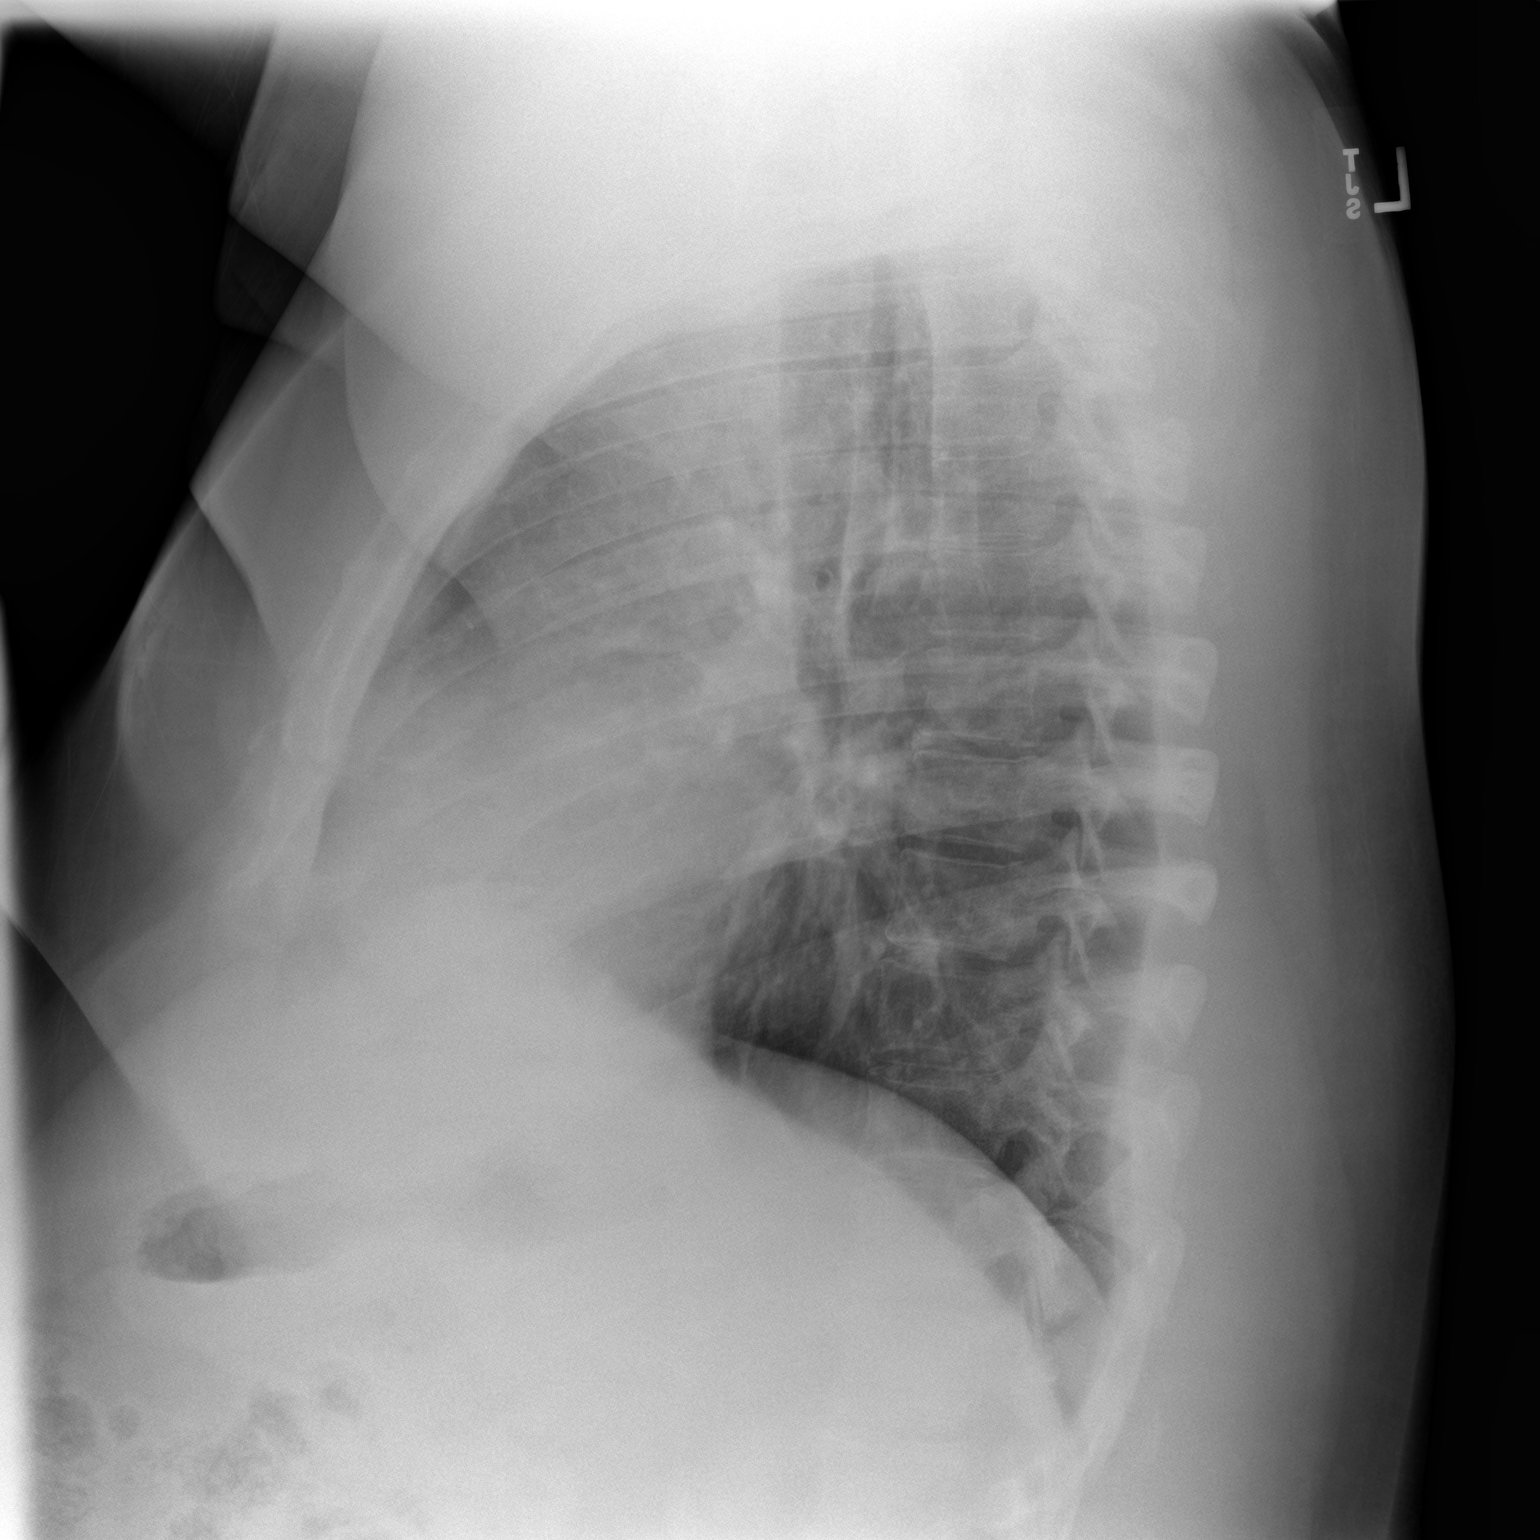

[2 of 2 positions shown; findings below may reference images not displayed]

FINDINGS: Insert nor chin
IMPRESSION: No active cardiopulmonary disease.

## 2021-03-02 DIAGNOSIS — G4733 Obstructive sleep apnea (adult) (pediatric): Secondary | ICD-10-CM | POA: Diagnosis not present

## 2021-03-30 DIAGNOSIS — E785 Hyperlipidemia, unspecified: Secondary | ICD-10-CM | POA: Diagnosis not present

## 2021-03-30 DIAGNOSIS — E1159 Type 2 diabetes mellitus with other circulatory complications: Secondary | ICD-10-CM | POA: Diagnosis not present

## 2021-03-30 DIAGNOSIS — I152 Hypertension secondary to endocrine disorders: Secondary | ICD-10-CM | POA: Diagnosis not present

## 2021-03-30 DIAGNOSIS — E1122 Type 2 diabetes mellitus with diabetic chronic kidney disease: Secondary | ICD-10-CM | POA: Diagnosis not present

## 2021-03-30 DIAGNOSIS — E1169 Type 2 diabetes mellitus with other specified complication: Secondary | ICD-10-CM | POA: Diagnosis not present

## 2021-04-13 DIAGNOSIS — E1159 Type 2 diabetes mellitus with other circulatory complications: Secondary | ICD-10-CM | POA: Diagnosis not present

## 2021-04-13 DIAGNOSIS — E1169 Type 2 diabetes mellitus with other specified complication: Secondary | ICD-10-CM | POA: Diagnosis not present

## 2021-04-13 DIAGNOSIS — E1122 Type 2 diabetes mellitus with diabetic chronic kidney disease: Secondary | ICD-10-CM | POA: Diagnosis not present

## 2021-07-20 DIAGNOSIS — E1122 Type 2 diabetes mellitus with diabetic chronic kidney disease: Secondary | ICD-10-CM | POA: Diagnosis not present

## 2021-07-20 DIAGNOSIS — R21 Rash and other nonspecific skin eruption: Secondary | ICD-10-CM | POA: Diagnosis not present

## 2021-07-20 DIAGNOSIS — E1169 Type 2 diabetes mellitus with other specified complication: Secondary | ICD-10-CM | POA: Diagnosis not present

## 2021-07-20 DIAGNOSIS — E1159 Type 2 diabetes mellitus with other circulatory complications: Secondary | ICD-10-CM | POA: Diagnosis not present

## 2021-08-31 DIAGNOSIS — E1122 Type 2 diabetes mellitus with diabetic chronic kidney disease: Secondary | ICD-10-CM | POA: Diagnosis not present

## 2021-08-31 DIAGNOSIS — I152 Hypertension secondary to endocrine disorders: Secondary | ICD-10-CM | POA: Diagnosis not present

## 2021-08-31 DIAGNOSIS — E785 Hyperlipidemia, unspecified: Secondary | ICD-10-CM | POA: Diagnosis not present

## 2021-08-31 DIAGNOSIS — E1159 Type 2 diabetes mellitus with other circulatory complications: Secondary | ICD-10-CM | POA: Diagnosis not present

## 2021-08-31 DIAGNOSIS — E1169 Type 2 diabetes mellitus with other specified complication: Secondary | ICD-10-CM | POA: Diagnosis not present

## 2021-09-01 DIAGNOSIS — G4733 Obstructive sleep apnea (adult) (pediatric): Secondary | ICD-10-CM | POA: Diagnosis not present

## 2021-09-07 DIAGNOSIS — E1159 Type 2 diabetes mellitus with other circulatory complications: Secondary | ICD-10-CM | POA: Diagnosis not present

## 2021-09-07 DIAGNOSIS — Z0000 Encounter for general adult medical examination without abnormal findings: Secondary | ICD-10-CM | POA: Diagnosis not present

## 2021-09-07 DIAGNOSIS — E1122 Type 2 diabetes mellitus with diabetic chronic kidney disease: Secondary | ICD-10-CM | POA: Diagnosis not present

## 2021-09-07 DIAGNOSIS — E1169 Type 2 diabetes mellitus with other specified complication: Secondary | ICD-10-CM | POA: Diagnosis not present

## 2021-10-02 DIAGNOSIS — G4733 Obstructive sleep apnea (adult) (pediatric): Secondary | ICD-10-CM | POA: Diagnosis not present

## 2021-11-01 DIAGNOSIS — G4733 Obstructive sleep apnea (adult) (pediatric): Secondary | ICD-10-CM | POA: Diagnosis not present

## 2021-12-02 DIAGNOSIS — G4733 Obstructive sleep apnea (adult) (pediatric): Secondary | ICD-10-CM | POA: Diagnosis not present

## 2021-12-30 DIAGNOSIS — E119 Type 2 diabetes mellitus without complications: Secondary | ICD-10-CM | POA: Diagnosis not present

## 2022-01-02 DIAGNOSIS — G4733 Obstructive sleep apnea (adult) (pediatric): Secondary | ICD-10-CM | POA: Diagnosis not present

## 2022-01-30 DIAGNOSIS — G4733 Obstructive sleep apnea (adult) (pediatric): Secondary | ICD-10-CM | POA: Diagnosis not present

## 2022-03-06 DIAGNOSIS — G4733 Obstructive sleep apnea (adult) (pediatric): Secondary | ICD-10-CM | POA: Diagnosis not present

## 2022-03-19 DIAGNOSIS — E1159 Type 2 diabetes mellitus with other circulatory complications: Secondary | ICD-10-CM | POA: Diagnosis not present

## 2022-03-19 DIAGNOSIS — E1122 Type 2 diabetes mellitus with diabetic chronic kidney disease: Secondary | ICD-10-CM | POA: Diagnosis not present

## 2022-03-26 DIAGNOSIS — E1159 Type 2 diabetes mellitus with other circulatory complications: Secondary | ICD-10-CM | POA: Diagnosis not present

## 2022-03-26 DIAGNOSIS — E1165 Type 2 diabetes mellitus with hyperglycemia: Secondary | ICD-10-CM | POA: Diagnosis not present

## 2022-03-26 DIAGNOSIS — E1122 Type 2 diabetes mellitus with diabetic chronic kidney disease: Secondary | ICD-10-CM | POA: Diagnosis not present

## 2022-03-26 DIAGNOSIS — E785 Hyperlipidemia, unspecified: Secondary | ICD-10-CM | POA: Diagnosis not present

## 2022-04-05 DIAGNOSIS — G4733 Obstructive sleep apnea (adult) (pediatric): Secondary | ICD-10-CM | POA: Diagnosis not present

## 2022-05-06 DIAGNOSIS — G4733 Obstructive sleep apnea (adult) (pediatric): Secondary | ICD-10-CM | POA: Diagnosis not present

## 2022-06-18 DIAGNOSIS — G4733 Obstructive sleep apnea (adult) (pediatric): Secondary | ICD-10-CM | POA: Diagnosis not present

## 2022-07-09 DIAGNOSIS — E1122 Type 2 diabetes mellitus with diabetic chronic kidney disease: Secondary | ICD-10-CM | POA: Diagnosis not present

## 2022-07-19 DIAGNOSIS — G4733 Obstructive sleep apnea (adult) (pediatric): Secondary | ICD-10-CM | POA: Diagnosis not present

## 2022-08-18 DIAGNOSIS — G4733 Obstructive sleep apnea (adult) (pediatric): Secondary | ICD-10-CM | POA: Diagnosis not present

## 2022-09-03 DIAGNOSIS — Z0000 Encounter for general adult medical examination without abnormal findings: Secondary | ICD-10-CM | POA: Diagnosis not present

## 2022-09-03 DIAGNOSIS — E1122 Type 2 diabetes mellitus with diabetic chronic kidney disease: Secondary | ICD-10-CM | POA: Diagnosis not present

## 2022-09-10 DIAGNOSIS — E1122 Type 2 diabetes mellitus with diabetic chronic kidney disease: Secondary | ICD-10-CM | POA: Diagnosis not present

## 2022-09-10 DIAGNOSIS — E1159 Type 2 diabetes mellitus with other circulatory complications: Secondary | ICD-10-CM | POA: Diagnosis not present

## 2022-09-10 DIAGNOSIS — E785 Hyperlipidemia, unspecified: Secondary | ICD-10-CM | POA: Diagnosis not present

## 2022-10-18 DIAGNOSIS — G4733 Obstructive sleep apnea (adult) (pediatric): Secondary | ICD-10-CM | POA: Diagnosis not present

## 2022-11-17 DIAGNOSIS — G4733 Obstructive sleep apnea (adult) (pediatric): Secondary | ICD-10-CM | POA: Diagnosis not present

## 2022-12-18 DIAGNOSIS — G4733 Obstructive sleep apnea (adult) (pediatric): Secondary | ICD-10-CM | POA: Diagnosis not present

## 2022-12-19 DIAGNOSIS — E1122 Type 2 diabetes mellitus with diabetic chronic kidney disease: Secondary | ICD-10-CM | POA: Diagnosis not present

## 2022-12-19 DIAGNOSIS — R0609 Other forms of dyspnea: Secondary | ICD-10-CM | POA: Diagnosis not present

## 2022-12-19 DIAGNOSIS — N183 Chronic kidney disease, stage 3 unspecified: Secondary | ICD-10-CM | POA: Diagnosis not present

## 2022-12-19 DIAGNOSIS — J209 Acute bronchitis, unspecified: Secondary | ICD-10-CM | POA: Diagnosis not present

## 2022-12-19 DIAGNOSIS — E1159 Type 2 diabetes mellitus with other circulatory complications: Secondary | ICD-10-CM | POA: Diagnosis not present

## 2022-12-23 ENCOUNTER — Other Ambulatory Visit: Payer: Self-pay

## 2022-12-23 ENCOUNTER — Emergency Department: Payer: Federal, State, Local not specified - PPO

## 2022-12-23 ENCOUNTER — Inpatient Hospital Stay
Admission: EM | Admit: 2022-12-23 | Discharge: 2022-12-26 | DRG: 286 | Disposition: A | Discharge status: Home / Self Care | Payer: Federal, State, Local not specified - PPO | Attending: Internal Medicine | Admitting: Internal Medicine

## 2022-12-23 ENCOUNTER — Observation Stay: Payer: Federal, State, Local not specified - PPO

## 2022-12-23 DIAGNOSIS — E1169 Type 2 diabetes mellitus with other specified complication: Secondary | ICD-10-CM | POA: Diagnosis present

## 2022-12-23 DIAGNOSIS — R059 Cough, unspecified: Secondary | ICD-10-CM | POA: Diagnosis not present

## 2022-12-23 DIAGNOSIS — N1831 Chronic kidney disease, stage 3a: Secondary | ICD-10-CM | POA: Diagnosis not present

## 2022-12-23 DIAGNOSIS — U071 COVID-19: Secondary | ICD-10-CM | POA: Diagnosis not present

## 2022-12-23 DIAGNOSIS — I10 Essential (primary) hypertension: Secondary | ICD-10-CM | POA: Diagnosis not present

## 2022-12-23 DIAGNOSIS — I509 Heart failure, unspecified: Secondary | ICD-10-CM | POA: Diagnosis not present

## 2022-12-23 DIAGNOSIS — E1159 Type 2 diabetes mellitus with other circulatory complications: Secondary | ICD-10-CM | POA: Diagnosis not present

## 2022-12-23 DIAGNOSIS — E876 Hypokalemia: Secondary | ICD-10-CM | POA: Diagnosis present

## 2022-12-23 DIAGNOSIS — E1122 Type 2 diabetes mellitus with diabetic chronic kidney disease: Secondary | ICD-10-CM | POA: Diagnosis present

## 2022-12-23 DIAGNOSIS — I5021 Acute systolic (congestive) heart failure: Secondary | ICD-10-CM | POA: Diagnosis present

## 2022-12-23 DIAGNOSIS — I160 Hypertensive urgency: Secondary | ICD-10-CM | POA: Diagnosis present

## 2022-12-23 DIAGNOSIS — J1282 Pneumonia due to coronavirus disease 2019: Secondary | ICD-10-CM | POA: Diagnosis present

## 2022-12-23 DIAGNOSIS — Z6841 Body Mass Index (BMI) 40.0 and over, adult: Secondary | ICD-10-CM | POA: Diagnosis not present

## 2022-12-23 DIAGNOSIS — Z7984 Long term (current) use of oral hypoglycemic drugs: Secondary | ICD-10-CM

## 2022-12-23 DIAGNOSIS — E785 Hyperlipidemia, unspecified: Secondary | ICD-10-CM | POA: Diagnosis present

## 2022-12-23 DIAGNOSIS — I428 Other cardiomyopathies: Secondary | ICD-10-CM | POA: Diagnosis present

## 2022-12-23 DIAGNOSIS — I152 Hypertension secondary to endocrine disorders: Secondary | ICD-10-CM | POA: Diagnosis not present

## 2022-12-23 DIAGNOSIS — I130 Hypertensive heart and chronic kidney disease with heart failure and stage 1 through stage 4 chronic kidney disease, or unspecified chronic kidney disease: Principal | ICD-10-CM | POA: Diagnosis present

## 2022-12-23 DIAGNOSIS — Z79899 Other long term (current) drug therapy: Secondary | ICD-10-CM

## 2022-12-23 DIAGNOSIS — I110 Hypertensive heart disease with heart failure: Secondary | ICD-10-CM | POA: Diagnosis not present

## 2022-12-23 DIAGNOSIS — N183 Chronic kidney disease, stage 3 unspecified: Secondary | ICD-10-CM | POA: Diagnosis present

## 2022-12-23 DIAGNOSIS — R0602 Shortness of breath: Secondary | ICD-10-CM | POA: Diagnosis not present

## 2022-12-23 DIAGNOSIS — Z91013 Allergy to seafood: Secondary | ICD-10-CM

## 2022-12-23 DIAGNOSIS — R000 Tachycardia, unspecified: Secondary | ICD-10-CM | POA: Diagnosis present

## 2022-12-23 DIAGNOSIS — Z833 Family history of diabetes mellitus: Secondary | ICD-10-CM

## 2022-12-23 DIAGNOSIS — J189 Pneumonia, unspecified organism: Secondary | ICD-10-CM | POA: Diagnosis not present

## 2022-12-23 DIAGNOSIS — G4733 Obstructive sleep apnea (adult) (pediatric): Secondary | ICD-10-CM | POA: Diagnosis present

## 2022-12-23 DIAGNOSIS — Z8249 Family history of ischemic heart disease and other diseases of the circulatory system: Secondary | ICD-10-CM

## 2022-12-23 LAB — CBC WITH DIFFERENTIAL/PLATELET
Abs Immature Granulocytes: 0.01 10*3/uL (ref 0.00–0.07)
Basophils Absolute: 0.1 10*3/uL (ref 0.0–0.1)
Basophils Relative: 1 %
Eosinophils Absolute: 0.4 10*3/uL (ref 0.0–0.5)
Eosinophils Relative: 6 %
HCT: 46.3 % (ref 39.0–52.0)
Hemoglobin: 14.9 g/dL (ref 13.0–17.0)
Immature Granulocytes: 0 %
Lymphocytes Relative: 44 %
Lymphs Abs: 3.3 10*3/uL (ref 0.7–4.0)
MCH: 29.4 pg (ref 26.0–34.0)
MCHC: 32.2 g/dL (ref 30.0–36.0)
MCV: 91.5 fL (ref 80.0–100.0)
Monocytes Absolute: 0.5 10*3/uL (ref 0.1–1.0)
Monocytes Relative: 7 %
Neutro Abs: 3 10*3/uL (ref 1.7–7.7)
Neutrophils Relative %: 42 %
Platelets: 303 10*3/uL (ref 150–400)
RBC: 5.06 MIL/uL (ref 4.22–5.81)
RDW: 14.7 % (ref 11.5–15.5)
WBC: 7.3 10*3/uL (ref 4.0–10.5)
nRBC: 0 % (ref 0.0–0.2)

## 2022-12-23 LAB — BRAIN NATRIURETIC PEPTIDE: B Natriuretic Peptide: 481.7 pg/mL — ABNORMAL HIGH (ref 0.0–100.0)

## 2022-12-23 LAB — BASIC METABOLIC PANEL
Anion gap: 8 (ref 5–15)
BUN: 19 mg/dL (ref 6–20)
CO2: 29 mmol/L (ref 22–32)
Calcium: 8.9 mg/dL (ref 8.9–10.3)
Chloride: 101 mmol/L (ref 98–111)
Creatinine, Ser: 1.59 mg/dL — ABNORMAL HIGH (ref 0.61–1.24)
GFR, Estimated: 55 mL/min — ABNORMAL LOW (ref >60)
Glucose, Bld: 106 mg/dL — ABNORMAL HIGH (ref 70–99)
Potassium: 3.2 mmol/L — ABNORMAL LOW (ref 3.5–5.1)
Sodium: 138 mmol/L (ref 135–145)

## 2022-12-23 LAB — COMPREHENSIVE METABOLIC PANEL
ALT: 23 U/L (ref 0–44)
AST: 21 U/L (ref 15–41)
Albumin: 3.8 g/dL (ref 3.5–5.0)
Alkaline Phosphatase: 56 U/L (ref 38–126)
Anion gap: 12 (ref 5–15)
BUN: 19 mg/dL (ref 6–20)
CO2: 24 mmol/L (ref 22–32)
Calcium: 9.4 mg/dL (ref 8.9–10.3)
Chloride: 103 mmol/L (ref 98–111)
Creatinine, Ser: 1.59 mg/dL — ABNORMAL HIGH (ref 0.61–1.24)
GFR, Estimated: 55 mL/min — ABNORMAL LOW (ref >60)
Glucose, Bld: 126 mg/dL — ABNORMAL HIGH (ref 70–99)
Potassium: 3.9 mmol/L (ref 3.5–5.1)
Sodium: 139 mmol/L (ref 135–145)
Total Bilirubin: 1 mg/dL (ref 0.3–1.2)
Total Protein: 6.9 g/dL (ref 6.5–8.1)

## 2022-12-23 LAB — MAGNESIUM: Magnesium: 2.1 mg/dL (ref 1.7–2.4)

## 2022-12-23 LAB — D-DIMER, QUANTITATIVE: D-Dimer, Quant: 0.78 ug/mL-FEU — ABNORMAL HIGH (ref 0.00–0.50)

## 2022-12-23 LAB — CBG MONITORING, ED: Glucose-Capillary: 105 mg/dL — ABNORMAL HIGH (ref 70–99)

## 2022-12-23 LAB — TROPONIN I (HIGH SENSITIVITY)
Troponin I (High Sensitivity): 19 ng/L — ABNORMAL HIGH (ref <18)
Troponin I (High Sensitivity): 23 ng/L — ABNORMAL HIGH (ref <18)

## 2022-12-23 LAB — PROCALCITONIN: Procalcitonin: <0.1 ng/mL

## 2022-12-23 MED ORDER — METOPROLOL TARTRATE 25 MG PO TABS
12.5000 mg | ORAL_TABLET | Freq: Two times a day (BID) | ORAL | Status: DC
  Administered 2022-12-23 – 2022-12-24 (×2): 12.5 mg via ORAL
  Filled 2022-12-23 (×2): qty 1

## 2022-12-23 MED ORDER — ATORVASTATIN CALCIUM 20 MG PO TABS
20.0000 mg | ORAL_TABLET | Freq: Every day | ORAL | Status: DC
  Administered 2022-12-24: 20 mg via ORAL
  Filled 2022-12-23: qty 1

## 2022-12-23 MED ORDER — IPRATROPIUM-ALBUTEROL 0.5-2.5 (3) MG/3ML IN SOLN
3.0000 mL | Freq: Four times a day (QID) | RESPIRATORY_TRACT | Status: DC
  Administered 2022-12-23 – 2022-12-24 (×2): 3 mL via RESPIRATORY_TRACT
  Filled 2022-12-23 (×2): qty 3

## 2022-12-23 MED ORDER — ACETAMINOPHEN 325 MG PO TABS: 650.0000 mg | ORAL_TABLET | Freq: Four times a day (QID) | ORAL | Status: DC | PRN

## 2022-12-23 MED ORDER — FUROSEMIDE 10 MG/ML IJ SOLN
40.0000 mg | Freq: Two times a day (BID) | INTRAMUSCULAR | Status: DC
  Administered 2022-12-24 – 2022-12-26 (×5): 40 mg via INTRAVENOUS
  Filled 2022-12-23 (×5): qty 4

## 2022-12-23 MED ORDER — ASPIRIN 81 MG PO TBEC
81.0000 mg | DELAYED_RELEASE_TABLET | Freq: Every day | ORAL | Status: DC
  Administered 2022-12-24 – 2022-12-26 (×3): 81 mg via ORAL
  Filled 2022-12-23 (×3): qty 1

## 2022-12-23 MED ORDER — GUAIFENESIN-DM 100-10 MG/5ML PO SYRP
10.0000 mL | ORAL_SOLUTION | Freq: Four times a day (QID) | ORAL | Status: AC
  Administered 2022-12-23 – 2022-12-25 (×7): 10 mL via ORAL
  Filled 2022-12-23 (×8): qty 10

## 2022-12-23 MED ORDER — HYDRALAZINE HCL 50 MG PO TABS
25.0000 mg | ORAL_TABLET | Freq: Three times a day (TID) | ORAL | Status: DC
  Administered 2022-12-23 – 2022-12-24 (×2): 25 mg via ORAL
  Filled 2022-12-23 (×2): qty 1

## 2022-12-23 MED ORDER — INSULIN ASPART 100 UNIT/ML IJ SOLN
0.0000 [IU] | Freq: Three times a day (TID) | INTRAMUSCULAR | Status: DC
  Administered 2022-12-24: 9 [IU] via SUBCUTANEOUS
  Administered 2022-12-24 – 2022-12-26 (×4): 1 [IU] via SUBCUTANEOUS
  Administered 2022-12-26: 2 [IU] via SUBCUTANEOUS
  Filled 2022-12-23 (×6): qty 1

## 2022-12-23 MED ORDER — HEPARIN SODIUM (PORCINE) 5000 UNIT/ML IJ SOLN
5000.0000 [IU] | Freq: Three times a day (TID) | INTRAMUSCULAR | Status: DC
  Administered 2022-12-23 – 2022-12-25 (×5): 5000 [IU] via SUBCUTANEOUS
  Filled 2022-12-23 (×5): qty 1

## 2022-12-23 MED ORDER — ISOSORBIDE DINITRATE 10 MG PO TABS
10.0000 mg | ORAL_TABLET | Freq: Three times a day (TID) | ORAL | Status: DC
  Administered 2022-12-23 – 2022-12-24 (×3): 10 mg via ORAL
  Filled 2022-12-23 (×4): qty 1

## 2022-12-23 MED ORDER — INSULIN ASPART 100 UNIT/ML IJ SOLN: 0.0000 [IU] | Freq: Every day | INTRAMUSCULAR | Status: DC

## 2022-12-23 MED ORDER — VITAMIN C 500 MG PO TABS
500.0000 mg | ORAL_TABLET | Freq: Two times a day (BID) | ORAL | Status: DC
  Administered 2022-12-23 – 2022-12-26 (×6): 500 mg via ORAL
  Filled 2022-12-23 (×6): qty 1

## 2022-12-23 MED ORDER — DOXYCYCLINE HYCLATE 100 MG PO TABS
100.0000 mg | ORAL_TABLET | Freq: Two times a day (BID) | ORAL | Status: DC
  Administered 2022-12-23 – 2022-12-26 (×6): 100 mg via ORAL
  Filled 2022-12-23 (×6): qty 1

## 2022-12-23 MED ORDER — FUROSEMIDE 10 MG/ML IJ SOLN
20.0000 mg | Freq: Once | INTRAMUSCULAR | Status: AC
  Administered 2022-12-23: 20 mg via INTRAVENOUS
  Filled 2022-12-23: qty 4

## 2022-12-23 MED ORDER — IOHEXOL 350 MG/ML SOLN
75.0000 mL | Freq: Once | INTRAVENOUS | Status: AC | PRN
  Administered 2022-12-23: 75 mL via INTRAVENOUS

## 2022-12-23 MED ORDER — FUROSEMIDE 10 MG/ML IJ SOLN
40.0000 mg | Freq: Once | INTRAMUSCULAR | Status: AC
  Administered 2022-12-23: 40 mg via INTRAVENOUS
  Filled 2022-12-23: qty 4

## 2022-12-23 MED ORDER — POTASSIUM CHLORIDE CRYS ER 20 MEQ PO TBCR
40.0000 meq | EXTENDED_RELEASE_TABLET | Freq: Once | ORAL | Status: AC
  Administered 2022-12-23: 40 meq via ORAL
  Filled 2022-12-23: qty 2

## 2022-12-23 NOTE — ED Notes (Signed)
[employer] messaged about the pt having multiple PVCs. Waiting to hear back if she wants any lab work drawn at this time. He was medicated with metoprolol per [facility]

## 2022-12-23 NOTE — H&P (Signed)
History and Physical    Patient: Jordan Greer [id number] DOB: 1980-12-13 DOA: 12/23/2022 DOS: the patient was seen and examined on 12/23/2022 PCP: Wilhelmina Mcardle, MD  Patient coming from: Home  Chief Complaint:  Chief Complaint  Patient presents with   Shortness of Breath   Cough   Covid Positive   HPI: Jordan Greer is a 42 y.o. male who presents to the hospital with SOB. On Wed he went to his PCP because he was coughing up whitish sputum. 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 This morning he went to the urgent care center and had a COVID test completed which was positive. He was also told that his BP was high and that he should immediately go to the ER.  Pt goes on to say that despite his symptoms worsening on Wed, he has been having ongoing sob for the past 1 month. He also describes getting fatigued early with exertion. For instance, anytime he has to climb stairs or do anything with an incline he gets sob. He denies any chest pain. BP in the ER today was indeed elevated (179/132).  He will be placed on observation for further management.  Review of Systems: As mentioned in the history of present illness. All other systems reviewed and are negative. Past Medical History:  Diagnosis Date   Colitis    Colitis    Diabetes mellitus without complication ([place])    Hyperlipidemia    Hypertension    Past Surgical History:  Procedure Laterality Date   none     Social History:  reports that he has never smoked. He has never used smokeless tobacco. He reports current alcohol use of about 18.0 standard drinks of alcohol per week. He reports that he does not use drugs.  Allergies  Allergen Reactions   Fish Allergy Itching and Nausea And Vomiting    Family History  Problem Relation Age of Onset   Hypertension Mother    Diabetes Mellitus II Mother     Prior to Admission medications   Medication Sig Start Date End Date Taking? Authorizing Provider   atorvastatin (LIPITOR) 20 MG tablet Take 20 mg by mouth daily.    [provider]  glipiZIDE (GLUCOTROL XL) 10 MG 24 hr tablet Take 10 mg by mouth 2 (two) times daily.     [provider]  metFORMIN (GLUCOPHAGE-XR) 500 MG 24 hr tablet Take 1,000 mg by mouth 2 (two) times daily.    [provider]  metoprolol succinate (TOPROL-XL) 100 MG 24 hr tablet Take 100 mg by mouth daily. Take with or immediately following a meal.    [provider]  NIFEdipine (ADALAT CC) 60 MG 24 hr tablet Take 1 tablet (60 mg total) by mouth daily. 03/01/19   Lang Snow, NP  vitamin C (VITAMIN C) 500 MG tablet Take 1 tablet (500 mg total) by mouth daily. 03/01/19   Lang Snow, NP    Physical Exam: Vitals:   12/23/22 1600 12/23/22 1630 12/23/22 1700 12/23/22 1730  BP: (!) 165/110 (!) 165/132 (!) 175/115 (!) 179/132  Pulse: 88 91 93 93  Resp: 15 (!) 31 19   Temp:      SpO2: 98% 98% 97% 100%  Weight:      Height:       Physical Exam Constitutional:      Appearance: He is well-developed.  HENT:     Head: Normocephalic and atraumatic.  Cardiovascular:  Rate and Rhythm: Normal rate and regular rhythm.  Pulmonary:     Effort: Pulmonary effort is normal.     Breath sounds: Normal breath sounds.  Abdominal:     Palpations: Abdomen is soft.     Comments: Obese  Musculoskeletal:     Cervical back: Neck supple.     Right lower leg: Edema present.     Left lower leg: Edema present.  Skin:    General: Skin is warm.     Comments: 1+ pitting edema b/l lower legs   Neurological:     Mental Status: He is alert and oriented to person, place, and time.  Psychiatric:        Mood and Affect: Mood normal.    Data Reviewed:  There are no new results to review at this time.  Assessment and Plan: SOB (suspect new onset CHF; BNP 481) - ECHO  - Cardiac monitoring  - IV lasix 40 mg bid  - Strict I and O  - Cardiac diet with 1.5L daily fluid restriction   - A1c/lipid panel   COVID 19 PNA  - Not requiring oxygen at this time, supportive care  - Vit C 500 mg PO bid  - Doxycycline 100 mg PO q12  - Robitussin 10 mL PO q6 hr  - Duoneb q6 hr   3. HTN urgency  - Hydralazine 25 mg PO q8hr  - Isordil 10 mg PO tid  - Lopressor 12.5 mg PO bid   4. Troponinemia  - No chest pain  - Suspect demand from the elevated HTN  - ECHO as above  - Atorvastatin 20 mg PO daily  - ASA 81 mg PO daily   5. DM  - A1c - Novolog SS ACHS - Atorvastatin 20 mg PO daily  - ASA 81 mg PO daily   DVT prophylaxis: Heparin 5000 units sq q8hr    Advance Care Planning:   Code Status: Prior FULL  Severity of Illness: The appropriate patient status for this patient is OBSERVATION. Observation status is judged to be reasonable and necessary in order to provide the required intensity of service to ensure the patient's safety. The patient's presenting symptoms, physical exam findings, and initial radiographic and laboratory data in the context of their medical condition is felt to place them at decreased risk for further clinical deterioration. Furthermore, it is anticipated that the patient will be medically stable for discharge from the hospital within 2 midnights of admission.   Author: Lucienne Minks , MD 12/23/2022 6:07 PM  For on call review [email or web address].

## 2022-12-23 NOTE — ED Triage Notes (Signed)
Pt c/o sob for about 1 month and a productive cough for about 2 weeks. Pt reports he tested positive today for covid at [facility]. States his PCP started him on abx for possible pneumonia on Wednesday. Pt states the provider at [facility] told the patient to go to the ER for concerns of enlarged heart. Pt is AxOx4. NAD noted. Pt denies cp or any other associated symptoms.

## 2022-12-23 NOTE — Progress Notes (Signed)
       CROSS COVER NOTE  NAME: Jordan Greer MRN: [id number] DOB : July 01, 1981 ATTENDING PHYSICIAN: Lucienne Minks, MD    Date of Service   12/23/2022   HPI/Events of Note   Message received from nursing reporting Ddimer of 0.78. Jordan Greer is also having ectopy after receiving IV lasix today.  Jordan Greer presented to [facility] ED 12/23/22 with dypsnea, cough, and recently tested COVID positive. SOB has been ongoing and progressive for the past month.   Interventions   Assessment/Plan:  CTA PE -negative BMP, Mg - K-->3.2 Mg 2.1 40 mEQ PO K       To reach the provider On-Call:   7AM- 7PM see care teams to locate the attending and reach out to them via [email or web address] Password: [email or web address] 7PM-7AM contact night-coverage If you still have difficulty reaching the appropriate provider, please page the [facility] (Director on Call) for [facility] on amion for assistance  This document was prepared using Systems analyst and may include unintentional dictation errors.  Neomia Glass DNP, MBA, FNP-BC, PMHNP-BC Nurse Practitioner [facility] Pager [phone]

## 2022-12-23 NOTE — ED Provider Notes (Signed)
[facility] Provider Note    Event Date/Time   First MD Initiated Contact with Patient 12/23/22 1454     (approximate)   History   Shortness of Breath, Cough, and Covid Positive   HPI  TRUE Jordan Greer is a 42 y.o. male here with cough and shortness of breath.  The patient states that over the last month, he has had progressively worsening shortness of breath, particularly with exertion.  He is also had some intermittent chills and has had several colds.  He states that over the last week, he has began to have increased cough with some sputum production as well as general fatigue.  He went to urgent care today and was told that he had COVID.  He was also started on antibiotic by his PCP several days ago.  He states he had actually been beginning to improve from COVID perspective.  He states that he had a chest x-ray done at urgent care today and was told to come here due to concern for possible cardiomegaly.  No history of heart failure that he is aware of, though he has taken Lasix in the past.  He has a history of sleep apnea.  He has hypertension.     Physical Exam   Triage Vital Signs: ED Triage Vitals  Enc Vitals Group     BP 12/23/22 1414 (!) 120/92     Pulse Rate 12/23/22 1414 97     Resp 12/23/22 1414 20     Temp 12/23/22 1414 97.7 F (36.5 C)     Temp src --      SpO2 12/23/22 1414 99 %     Weight 12/23/22 1415 (!) 366 lb (166 kg)     Height 12/23/22 1415 5\' 9"  (1.753 m)     Head Circumference --      Peak Flow --      Pain Score 12/23/22 1414 0     Pain Loc --      Pain Edu? --      Excl. in [place]? --     Most recent vital signs: Vitals:   12/23/22 1414 12/23/22 1600  BP: (!) 120/92 (!) 165/110  Pulse: 97 88  Resp: 20 15  Temp: 97.7 F (36.5 C)   SpO2: 99% 98%     General: Awake, no distress.  CV:  Good peripheral perfusion.  Regular rate and rhythm.  No murmurs rubs. Resp:  Normal effort.  Coarse breath sounds but overall clear.   No rales.  No wheezing. Abd:  No distention.  No tenderness. Other:  No significant lower extremity edema.   ED Results / Procedures / Treatments   Labs (all labs ordered are listed, but only abnormal results are displayed) Labs Reviewed  COMPREHENSIVE METABOLIC PANEL - Abnormal; Notable for the following components:      Result Value   Glucose, Bld 126 (*)    Creatinine, Ser 1.59 (*)    GFR, Estimated 55 (*)    All other components within normal limits  BRAIN NATRIURETIC PEPTIDE - Abnormal; Notable for the following components:   B Natriuretic Peptide 481.7 (*)    All other components within normal limits  TROPONIN I (HIGH SENSITIVITY) - Abnormal; Notable for the following components:   Troponin I (High Sensitivity) 19 (*)    All other components within normal limits  TROPONIN I (HIGH SENSITIVITY) - Abnormal; Notable for the following components:   Troponin I (High Sensitivity) 23 (*)  All other components within normal limits  CBC WITH DIFFERENTIAL/PLATELET  PROCALCITONIN  D-DIMER, QUANTITATIVE     EKG Normal sinus rhythm, ventricular rate 95.  PR 156, QRS 92, QTc 475.  No acute ST elevations or depressions.  No acute evidence of acute ischemia or infarct.   RADIOLOGY Chest x-ray: Mild cardiomegaly with diffuse interstitial prominence, favoring atypical pneumonia versus pulmonary edema   I also independently reviewed and agree with radiologist interpretations.   PROCEDURES:  Critical Care performed: No  .1-3 Lead EKG Interpretation  Performed by: Duffy Bruce, MD Authorized by: Duffy Bruce, MD     Interpretation: normal     ECG rate:  80-100   ECG rate assessment: normal     Rhythm: sinus rhythm     Ectopy: none     Conduction: normal   Comments:     Indication: Shortness of breath     MEDICATIONS ORDERED IN ED: Medications  furosemide (LASIX) injection 20 mg (has no administration in time range)  furosemide (LASIX) injection 40 mg (40 mg  Intravenous Given 12/23/22 1635)     IMPRESSION / MDM / ASSESSMENT AND PLAN / ED COURSE  I reviewed the triage vital signs and the nursing notes.                              Differential diagnosis includes, but is not limited to, viral URI/COVID-19, community-acquired pneumonia, CHF, anemia, ACS, deconditioning  Patient's presentation is most consistent with acute presentation with potential threat to life or bodily function.  The patient is on the cardiac monitor to evaluate for evidence of arrhythmia and/or significant heart rate changes.  42 yo M with PMHx obesity here with cough, DOE, SOB. Pt hypertensive, tachypneic on arrival. Satting well on RA however. CXR shows interstitial thickening. Trop stable, but BNP >400. Pt has no known h/o CHF and has never had an echo based on extensive review of his chart and his report. Pt also COVID+ in clinic today. While he is not hypoxic with ambulation, elevated trop + BNP with no known h/o same, COVID positivity, and increased WOB, will admit for diuresis and follow-up.  FINAL CLINICAL IMPRESSION(S) / ED DIAGNOSES   Final diagnoses:  Acute congestive heart failure, unspecified heart failure type ([place])  COVID-19     Rx / DC Orders   ED Discharge Orders     None        Note:  This document was prepared using Dragon voice recognition software and may include unintentional dictation errors.   Duffy Bruce, MD 12/23/22 [phone]

## 2022-12-23 NOTE — ED Notes (Addendum)
Patient ambulated in the room on room air. Sats stayed between 97%-100%, RR increased to 30-34. Resting RR is 20.

## 2022-12-23 NOTE — ED Notes (Signed)
Pt's wife updated.

## 2022-12-24 ENCOUNTER — Observation Stay (HOSPITAL_COMMUNITY)
Admit: 2022-12-24 | Discharge: 2022-12-24 | Disposition: A | Discharge status: Home / Self Care | Payer: Federal, State, Local not specified - PPO | Attending: Internal Medicine | Admitting: Internal Medicine

## 2022-12-24 ENCOUNTER — Encounter: Payer: Self-pay | Admitting: Internal Medicine

## 2022-12-24 DIAGNOSIS — E66813 Obesity, class 3: Secondary | ICD-10-CM | POA: Insufficient documentation

## 2022-12-24 DIAGNOSIS — U071 COVID-19: Secondary | ICD-10-CM

## 2022-12-24 DIAGNOSIS — I5021 Acute systolic (congestive) heart failure: Secondary | ICD-10-CM | POA: Diagnosis not present

## 2022-12-24 DIAGNOSIS — Z833 Family history of diabetes mellitus: Secondary | ICD-10-CM | POA: Diagnosis not present

## 2022-12-24 DIAGNOSIS — I509 Heart failure, unspecified: Secondary | ICD-10-CM | POA: Diagnosis not present

## 2022-12-24 DIAGNOSIS — I10 Essential (primary) hypertension: Secondary | ICD-10-CM | POA: Diagnosis not present

## 2022-12-24 DIAGNOSIS — J189 Pneumonia, unspecified organism: Secondary | ICD-10-CM

## 2022-12-24 DIAGNOSIS — E1159 Type 2 diabetes mellitus with other circulatory complications: Secondary | ICD-10-CM | POA: Diagnosis not present

## 2022-12-24 DIAGNOSIS — Z8249 Family history of ischemic heart disease and other diseases of the circulatory system: Secondary | ICD-10-CM | POA: Diagnosis not present

## 2022-12-24 DIAGNOSIS — E785 Hyperlipidemia, unspecified: Secondary | ICD-10-CM

## 2022-12-24 DIAGNOSIS — J1282 Pneumonia due to coronavirus disease 2019: Secondary | ICD-10-CM | POA: Diagnosis present

## 2022-12-24 DIAGNOSIS — I130 Hypertensive heart and chronic kidney disease with heart failure and stage 1 through stage 4 chronic kidney disease, or unspecified chronic kidney disease: Secondary | ICD-10-CM | POA: Diagnosis present

## 2022-12-24 DIAGNOSIS — I428 Other cardiomyopathies: Secondary | ICD-10-CM | POA: Diagnosis present

## 2022-12-24 DIAGNOSIS — R0602 Shortness of breath: Secondary | ICD-10-CM | POA: Diagnosis present

## 2022-12-24 DIAGNOSIS — Z7984 Long term (current) use of oral hypoglycemic drugs: Secondary | ICD-10-CM | POA: Diagnosis not present

## 2022-12-24 DIAGNOSIS — G4733 Obstructive sleep apnea (adult) (pediatric): Secondary | ICD-10-CM | POA: Diagnosis present

## 2022-12-24 DIAGNOSIS — E1169 Type 2 diabetes mellitus with other specified complication: Secondary | ICD-10-CM | POA: Diagnosis present

## 2022-12-24 DIAGNOSIS — Z79899 Other long term (current) drug therapy: Secondary | ICD-10-CM | POA: Diagnosis not present

## 2022-12-24 DIAGNOSIS — Z91013 Allergy to seafood: Secondary | ICD-10-CM | POA: Diagnosis not present

## 2022-12-24 DIAGNOSIS — I152 Hypertension secondary to endocrine disorders: Secondary | ICD-10-CM | POA: Diagnosis not present

## 2022-12-24 DIAGNOSIS — E876 Hypokalemia: Secondary | ICD-10-CM | POA: Diagnosis present

## 2022-12-24 DIAGNOSIS — E1122 Type 2 diabetes mellitus with diabetic chronic kidney disease: Secondary | ICD-10-CM | POA: Diagnosis present

## 2022-12-24 DIAGNOSIS — N1831 Chronic kidney disease, stage 3a: Secondary | ICD-10-CM

## 2022-12-24 DIAGNOSIS — I160 Hypertensive urgency: Secondary | ICD-10-CM | POA: Diagnosis present

## 2022-12-24 DIAGNOSIS — R000 Tachycardia, unspecified: Secondary | ICD-10-CM | POA: Diagnosis present

## 2022-12-24 DIAGNOSIS — Z6841 Body Mass Index (BMI) 40.0 and over, adult: Secondary | ICD-10-CM | POA: Diagnosis not present

## 2022-12-24 LAB — CBG MONITORING, ED
Glucose-Capillary: 108 mg/dL — ABNORMAL HIGH (ref 70–99)
Glucose-Capillary: 133 mg/dL — ABNORMAL HIGH (ref 70–99)

## 2022-12-24 LAB — ECHOCARDIOGRAM COMPLETE
AR max vel: 3.24 cm2
AV Area VTI: 2.87 cm2
AV Area mean vel: 3.26 cm2
AV Mean grad: 3 mmHg
AV Peak grad: 5.9 mmHg
Ao pk vel: 1.21 m/s
Area-P 1/2: 4.71 cm2
Height: 69 in
MV VTI: 2.93 cm2
S' Lateral: 4 cm
Weight: 5856 oz

## 2022-12-24 LAB — COMPREHENSIVE METABOLIC PANEL
ALT: 21 U/L (ref 0–44)
AST: 20 U/L (ref 15–41)
Albumin: 3.5 g/dL (ref 3.5–5.0)
Alkaline Phosphatase: 48 U/L (ref 38–126)
Anion gap: 9 (ref 5–15)
BUN: 18 mg/dL (ref 6–20)
CO2: 26 mmol/L (ref 22–32)
Calcium: 8.7 mg/dL — ABNORMAL LOW (ref 8.9–10.3)
Chloride: 103 mmol/L (ref 98–111)
Creatinine, Ser: 1.58 mg/dL — ABNORMAL HIGH (ref 0.61–1.24)
GFR, Estimated: 55 mL/min — ABNORMAL LOW (ref >60)
Glucose, Bld: 99 mg/dL (ref 70–99)
Potassium: 3.6 mmol/L (ref 3.5–5.1)
Sodium: 138 mmol/L (ref 135–145)
Total Bilirubin: 1.1 mg/dL (ref 0.3–1.2)
Total Protein: 6.4 g/dL — ABNORMAL LOW (ref 6.5–8.1)

## 2022-12-24 LAB — CBC
HCT: 41.3 % (ref 39.0–52.0)
Hemoglobin: 13.5 g/dL (ref 13.0–17.0)
MCH: 29.7 pg (ref 26.0–34.0)
MCHC: 32.7 g/dL (ref 30.0–36.0)
MCV: 90.8 fL (ref 80.0–100.0)
Platelets: 260 10*3/uL (ref 150–400)
RBC: 4.55 MIL/uL (ref 4.22–5.81)
RDW: 14.7 % (ref 11.5–15.5)
WBC: 7.6 10*3/uL (ref 4.0–10.5)
nRBC: 0 % (ref 0.0–0.2)

## 2022-12-24 LAB — PHOSPHORUS: Phosphorus: 4.3 mg/dL (ref 2.5–4.6)

## 2022-12-24 LAB — MAGNESIUM: Magnesium: 2.1 mg/dL (ref 1.7–2.4)

## 2022-12-24 LAB — LIPID PANEL
Cholesterol: 118 mg/dL (ref 0–200)
HDL: 36 mg/dL — ABNORMAL LOW (ref >40)
LDL Cholesterol: 60 mg/dL (ref 0–99)
Total CHOL/HDL Ratio: 3.3 RATIO
Triglycerides: 108 mg/dL (ref <150)
VLDL: 22 mg/dL (ref 0–40)

## 2022-12-24 LAB — HEMOGLOBIN A1C
Hgb A1c MFr Bld: 6 % — ABNORMAL HIGH (ref 4.8–5.6)
Mean Plasma Glucose: 125.5 mg/dL

## 2022-12-24 LAB — GLUCOSE, CAPILLARY
Glucose-Capillary: 384 mg/dL — ABNORMAL HIGH (ref 70–99)
Glucose-Capillary: 104 mg/dL — ABNORMAL HIGH (ref 70–99)

## 2022-12-24 MED ORDER — PERFLUTREN LIPID MICROSPHERE
1.0000 mL | INTRAVENOUS | Status: AC | PRN
  Administered 2022-12-24: 5 mL via INTRAVENOUS

## 2022-12-24 MED ORDER — GUAIFENESIN-DM 100-10 MG/5ML PO SYRP
10.0000 mL | ORAL_SOLUTION | ORAL | Status: DC | PRN
  Filled 2022-12-24: qty 10

## 2022-12-24 MED ORDER — DAPAGLIFLOZIN PROPANEDIOL 5 MG PO TABS
5.0000 mg | ORAL_TABLET | Freq: Every day | ORAL | Status: DC
  Administered 2022-12-24 – 2022-12-25 (×2): 5 mg via ORAL
  Filled 2022-12-24 (×2): qty 1

## 2022-12-24 MED ORDER — IRBESARTAN 150 MG PO TABS
300.0000 mg | ORAL_TABLET | Freq: Every day | ORAL | Status: DC
  Administered 2022-12-24 – 2022-12-25 (×2): 300 mg via ORAL
  Filled 2022-12-24 (×2): qty 2

## 2022-12-24 MED ORDER — HYDROCOD POLI-CHLORPHE POLI ER 10-8 MG/5ML PO SUER: 5.0000 mL | Freq: Two times a day (BID) | ORAL | Status: DC | PRN

## 2022-12-24 MED ORDER — AMLODIPINE BESYLATE 10 MG PO TABS
10.0000 mg | ORAL_TABLET | Freq: Every day | ORAL | Status: DC
  Administered 2022-12-24 – 2022-12-26 (×3): 10 mg via ORAL
  Filled 2022-12-24 (×2): qty 1
  Filled 2022-12-24: qty 2

## 2022-12-24 MED ORDER — NIRMATRELVIR/RITONAVIR (PAXLOVID) TABLET (RENAL DOSING)
2.0000 | ORAL_TABLET | Freq: Two times a day (BID) | ORAL | Status: DC
  Administered 2022-12-24 – 2022-12-26 (×5): 2 via ORAL
  Filled 2022-12-24: qty 20

## 2022-12-24 MED ORDER — HYDRALAZINE HCL 25 MG PO TABS
25.0000 mg | ORAL_TABLET | Freq: Three times a day (TID) | ORAL | Status: DC | PRN
  Administered 2022-12-25: 25 mg via ORAL
  Filled 2022-12-24: qty 1

## 2022-12-24 MED ORDER — METOPROLOL SUCCINATE ER 100 MG PO TB24
100.0000 mg | ORAL_TABLET | Freq: Every day | ORAL | Status: DC
  Administered 2022-12-24 – 2022-12-26 (×3): 100 mg via ORAL
  Filled 2022-12-24 (×3): qty 1

## 2022-12-24 MED ORDER — IPRATROPIUM-ALBUTEROL 20-100 MCG/ACT IN AERS
1.0000 | INHALATION_SPRAY | Freq: Four times a day (QID) | RESPIRATORY_TRACT | Status: DC
  Administered 2022-12-24 – 2022-12-26 (×4): 1 via RESPIRATORY_TRACT
  Filled 2022-12-24: qty 4

## 2022-12-24 MED ORDER — ZINC SULFATE 220 (50 ZN) MG PO CAPS
220.0000 mg | ORAL_CAPSULE | Freq: Every day | ORAL | Status: DC
  Administered 2022-12-24 – 2022-12-26 (×3): 220 mg via ORAL
  Filled 2022-12-24 (×3): qty 1

## 2022-12-24 NOTE — Assessment & Plan Note (Signed)
Estimated body mass index is 54.05 kg/m as calculated from the following:   Height as of this encounter: 5\' 9"  (1.753 m).   Weight as of this encounter: 166 kg.   -This will complicate overall prognosis -Counseling was provided

## 2022-12-24 NOTE — Progress Notes (Signed)
*  PRELIMINARY RESULTS* Echocardiogram 2D Echocardiogram has been performed.  Jordan Greer 12/24/2022, 11:40 AM

## 2022-12-24 NOTE — Hospital Course (Addendum)
Taken from H&P   Jordan Greer is a 42 y.o. male who presents to the hospital with SOB. On Wed he went to his PCP because he was coughing up whitish sputum. 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 This morning he went to the urgent care center and had a COVID test completed which was positive. He was also told that his BP was high and that he should immediately go to the ER.  Patient was having exertional dyspnea for the past 1 month.  ED course.  Was found to have elevated blood pressure at 179/132.  Lower extremity edema.  Labs pertinent for BNP elevated at 481, troponin 19>23, procalcitonin negative, D-dimer at 0.78.  Unremarkable CBC and renal function seems to be around his baseline. CTA negative for PE.  Did show bibasilar predominant bronchial wall thickening with scattered groundglass airspace disease, most consistent with bronchopneumonia.  Atypical viral etiology such as COVID could be considered.  Mild cardiomegaly.  2/5: Vitals with mild tachycardia and blood pressure remained elevated at 143/95.  Patient was on multiple antihypertensives at home which were restarted. Lipid panel without any significant abnormality.  LDL 60.  A1c of 6.  Renal functions seems to be stable and consistent with CKD stage III A.  Hypokalemia resolved. Echocardiogram with EF of 35 to 40% with regional wall motion abnormalities and hypokinesis of left ventricular, basal-mid inferior wall.  Dilated left atria.  Cardiology was consulted.  2/6: Blood pressure remained elevated.  Adding scheduled 25 mg of hydralazine.  Going for cardiac catheterization today on new diagnosis of HFrEF.  2/7: Blood pressure remained mildly elevated, although improved than before, at 151/111, cardiologist switched irbesartan with Entresto 97-103 will increase the dose of Farxiga. Cardiac catheterization yesterday consistent with nonischemic cardiomyopathy.  Labs with mild hypokalemia with potassium of 3.3.   Rest of the labs stable.  Lipoprotein a pending. Cardiology made changes to his medications, he will take Imdur and hydralazine together at this time and they are planning to switch to Bidil after insurance authorization.  Metoprolol was switched with carvedilol. He was given torsemide 60 mg daily and also started on spironolactone 25 mg daily to optimize heart failure medications.  Clinically appears euvolemic.  We discussed with patient about weight loss and having a close follow-up with cardiology for further recommendations. He was also given referral for cardiac rehab and heart failure clinic.  Patient will complete the current dose of Paxlovid, he will hold Crestor while taking Paxlovid and was instructed to resume after finishing the Paxlovid.  He will continue on current medications and need to have a close follow-up with his providers.

## 2022-12-24 NOTE — Consult Note (Signed)
 Cardiology Consultation   Patient ID: Jordan Greer MRN: [id number]; DOB: 06/30/1981  Admit date: 12/23/2022 Date of Consult: 12/24/2022  PCP:  Fleming, Ron D, MD   [facility] Providers Cardiologist:  None      New Consult done by Dr. Arida  Patient Profile:   Jordan Greer is a 42 y.o. male with a hx of hypertension, sleep apnea on CPAP, diabetes, and obesity, who is being seen 12/24/2022 for the evaluation of new onset HFrEF at the request of Dr. Amin.  History of Present Illness:   Mr. Jordan Greer is a 42-year-old male with a past medical history of hypertension, sleep apnea on CPAP, diabetes, and obesity.  He presented to the [facility] emergency department on 2//24 stating that over the last month he has had progressively worsening shortness of breath particular with exertion.  He also had some intermittent chills and had several colds off and on.  He states over the last week he began having increased cough with some sputum production as well as generalized fatigue.  Symptoms had worsened and he had opted to go to urgent care earlier during the day and was told that he was COVID-positive.  He had been started on antibiotic by his PCP several days prior.  States he is actually been improving from the COVID perspective.  He states that he had chest x-ray done in urgent care yesterday and was told to report to the emergency department due to concern for possible cardiomegaly.  He stated he had no history of heart failure that he was aware of but he notes that he has taken furosemide in the past.  He does have a history of sleep apnea.  He denied any chest pain, palpitations, lightheadedness/dizziness  but endorsed progressive shortness of breath, cough, and fatigue.  Initial vitals: Blood pressure 120/92, pulse of 97, respirations 20, temperature 97.7  Pertinent labs: Blood glucose 126, serum creatinine 1.59, GFR 55, BNP 41.7, high-sensitivity troponin 19 and 23, CBC was unremarkable,  procalcitonin was less than 10, D-dimer 0.78  Imaging: Chest x-ray revealed mild cardiomegaly with diffuse interstitial prominence, given absence of pleural effusions, bilateral mycoplasma pneumonia favored over pulmonary edema, not a typical appearance of COVID-19 pneumonia which cannot be excluded; CT angio of the chest shows no evidence of pulmonary embolism, basilar predominant bronchial wall thickening with scattered groundglass airspace disease, most consistent with bronchopneumonia, atypical viral etiology such as COVID can be considered, mild cardiomegaly  Medications received in the emergency department: Furosemide 40 mg IVP  Cardiology was consulted this afternoon after echocardiogram results revealed new HFrEF with an LVEF of EF of 35-40% and wall motion abnormality noted. Patient did endorse have some stabbing like chest pains that started off and on about 2 months ago with exertion. He has not had them recently just the progressive shortness of breath.  Past Medical History:  Diagnosis Date   Colitis    Colitis    Diabetes mellitus without complication (HCC)    Hyperlipidemia    Hypertension     Past Surgical History:  Procedure Laterality Date   none       Home Medications:  Prior to Admission medications   Medication Sig Start Date End Date Taking? Authorizing Provider  albuterol (VENTOLIN HFA) 108 (90 Base) MCG/ACT inhaler Inhale 2 puffs into the lungs every 6 (six) hours as needed for shortness of breath or wheezing. 12/23/22 12/23/23  [provider]  amLODipine (NORVASC) 10 MG tablet Take 10 mg by mouth daily.   01/30/19   [provider]  amoxicillin-clavulanate (AUGMENTIN) 875-125 MG tablet Take 1 tablet by mouth 2 (two) times daily. 12/19/22   [provider]  atorvastatin (LIPITOR) 20 MG tablet Take 20 mg by mouth daily.    [provider]  FARXIGA 5 MG TABS tablet Take 5 mg by mouth daily.    [provider]  furosemide  (LASIX) 40 MG tablet Take 40 mg by mouth daily. 01/30/19   [provider]  glimepiride (AMARYL) 4 MG tablet Take 4 mg by mouth in the morning and at bedtime. 01/26/20   [provider]  glipiZIDE (GLUCOTROL XL) 10 MG 24 hr tablet Take 10 mg by mouth 2 (two) times daily.     [provider]  metFORMIN (GLUCOPHAGE-XR) 500 MG 24 hr tablet Take 1,000 mg by mouth 2 (two) times daily.    [provider]  metoprolol succinate (TOPROL-XL) 100 MG 24 hr tablet Take 100 mg by mouth daily. Take with or immediately following a meal.    [provider]  NIFEdipine (ADALAT CC) 60 MG 24 hr tablet Take 1 tablet (60 mg total) by mouth daily. 03/01/19   Lang Snow, NP  olmesartan (BENICAR) 40 MG tablet Take 40 mg by mouth daily. 01/26/20   [provider]  OZEMPIC, 2 MG/DOSE, 8 MG/3ML SOPN Inject 2 mg into the skin once a week.    [provider]  rosuvastatin (CRESTOR) 20 MG tablet Take 20 mg by mouth at bedtime. 09/07/21   [provider]  vitamin C (VITAMIN C) 500 MG tablet Take 1 tablet (500 mg total) by mouth daily. 03/01/19   Lang Snow, NP    Inpatient Medications: Scheduled Meds:  amLODipine  10 mg Oral Daily   ascorbic acid  500 mg Oral BID   aspirin EC  81 mg Oral Daily   atorvastatin  20 mg Oral Daily   dapagliflozin propanediol  5 mg Oral Daily   doxycycline  100 mg Oral Q12H   furosemide  40 mg Intravenous BID   guaiFENesin-dextromethorphan  10 mL Oral Q6H   heparin injection (subcutaneous)  5,000 Units Subcutaneous Q8H   insulin aspart  0-5 Units Subcutaneous QHS   insulin aspart  0-9 Units Subcutaneous TID WC   Ipratropium-Albuterol  1 puff Inhalation Q6H   irbesartan  300 mg Oral Daily   metoprolol succinate  100 mg Oral Daily   Continuous Infusions:  PRN Meds: acetaminophen  Allergies:    Allergies  Allergen Reactions   Fish Allergy Itching and Nausea And Vomiting    Social History:    Social History   Socioeconomic History   Marital status: Married    Spouse name: Not on file   Number of children: Not on file   Years of education: Not on file   Highest education level: Not on file  Occupational History   Not on file  Tobacco Use   Smoking status: Never   Smokeless tobacco: Never  Substance and Sexual Activity   Alcohol use: Yes    Alcohol/week: 18.0 standard drinks of alcohol    Types: 18 Cans of beer per week   Drug use: No   Sexual activity: Yes  Other Topics Concern   Not on file  Social History Narrative   Not on file   Social Determinants of Health   Financial Resource Strain: Not on file  Food Insecurity: No Food Insecurity (12/24/2022)   Hunger Vital Sign  Worried About Programme researcher, broadcasting/film/video in the Last Year: Never true    Ran Out of Food in the Last Year: Never true  Transportation Needs: No Transportation Needs (12/24/2022)   PRAPARE - Administrator, Civil Service (Medical): No    Lack of Transportation (Non-Medical): No  Physical Activity: Not on file  Stress: Not on file  Social Connections: Not on file  Intimate Partner Violence: Not At Risk (12/24/2022)   Humiliation, Afraid, Rape, and Kick questionnaire    Fear of Current or Ex-Partner: No    Emotionally Abused: No    Physically Abused: No    Sexually Abused: No    Family History:    Family History  Problem Relation Age of Onset   Hypertension Mother    Diabetes Mellitus II Mother      ROS:  Please see the history of present illness.  Review of Systems  Constitutional:  Positive for malaise/fatigue.  Respiratory:  Positive for cough and shortness of breath.   Neurological:  Positive for weakness.    All other ROS reviewed and negative.     Physical Exam/Data:   Vitals:   12/24/22 0958 12/24/22 1000 12/24/22 1100 12/24/22 1200  BP: (!) 153/109 (!) 140/98 118/72 (!) 143/95  Pulse: 99 92 100   Resp:   (!) 24   Temp:    98.6 F (37 C)  TempSrc:    Oral   SpO2:  97% 99%   Weight:      Height:        Intake/Output Summary (Last 24 hours) at 12/24/2022 1227 Last data filed at 12/24/2022 0700 Gross per 24 hour  Intake --  Output 2300 ml  Net -2300 ml      12/23/2022    2:15 PM 06/12/2019    8:39 AM 03/16/2019    1:31 PM  Last 3 Weights  Weight (lbs) 366 lb 355 lb 12.8 oz 352 lb 6.4 oz  Weight (kg) 166.017 kg 161.39 kg 159.848 kg     Body mass index is 54.05 kg/m.  General:  Well nourished, well developed, in no acute distress HEENT: normal, glasses on Neck: difficult to assess JVD due to body habitus Vascular: No carotid bruits; Distal pulses 2+ bilaterally Cardiac:  normal S1, S2; RRR; no murmur  Lungs:  clear to auscultation bilaterally, no wheezing, rhonchi or rales, respirations are unlabored at rest on room air Abd: soft, nontender, no hepatomegaly  Ext: no edema Musculoskeletal:  No deformities, BUE and BLE strength normal and equal Skin: warm and dry  Neuro:  CNs 2-12 intact, no focal abnormalities noted Psych:  Normal affect   EKG:  The EKG was personally reviewed and demonstrates:  sinus rate of 95 with right axis deviation  Telemetry:  Telemetry was personally reviewed and demonstrates:  sinus rates 80-90  Relevant CV Studies: TTE 12/24/22 1. Left ventricular ejection fraction, by estimation, is 35 to 40%. The  left ventricle has moderately decreased function. The left ventricle  demonstrates regional wall motion abnormalities (see scoring  diagram/findings for description). There is  moderate left ventricular hypertrophy. Left ventricular diastolic  parameters are indeterminate. There is moderate hypokinesis of the left  ventricular, basal-mid inferior wall.   2. Right ventricular systolic function is normal. The right ventricular  size is normal. There is mildly elevated pulmonary artery systolic  pressure.   3. Left atrial size was moderately dilated.   4. The mitral valve is normal in structure. No evidence of  mitral valve  regurgitation. No evidence of mitral stenosis.   5. The aortic valve is normal in structure. Aortic valve regurgitation is  not visualized. No aortic stenosis is present.   6. The inferior vena cava is normal in size with <50% respiratory  variability, suggesting right atrial pressure of 8 mmHg.    Laboratory Data:  High Sensitivity Troponin:   Recent Labs  Lab 12/23/22 1417 12/23/22 1632  TROPONINIHS 19* 23*     Chemistry Recent Labs  Lab 12/23/22 1418 12/23/22 2224 12/24/22 0245  NA 139 138 138  K 3.9 3.2* 3.6  CL 103 101 103  CO2 24 29 26  GLUCOSE 126* 106* 99  BUN 19 19 18  CREATININE 1.59* 1.59* 1.58*  CALCIUM 9.4 8.9 8.7*  MG  --  2.1 2.1  GFRNONAA 55* 55* 55*  ANIONGAP 12 8 9    Recent Labs  Lab 12/23/22 1418 12/24/22 0245  PROT 6.9 6.4*  ALBUMIN 3.8 3.5  AST 21 20  ALT 23 21  ALKPHOS 56 48  BILITOT 1.0 1.1   Lipids  Recent Labs  Lab 12/24/22 0245  CHOL 118  TRIG 108  HDL 36*  LDLCALC 60  CHOLHDL 3.3    Hematology Recent Labs  Lab 12/23/22 1418 12/24/22 0245  WBC 7.3 7.6  RBC 5.06 4.55  HGB 14.9 13.5  HCT 46.3 41.3  MCV 91.5 90.8  MCH 29.4 29.7  MCHC 32.2 32.7  RDW 14.7 14.7  PLT 303 260   Thyroid No results for input(s): "TSH", "FREET4" in the last 168 hours.  BNP Recent Labs  Lab 12/23/22 1417  BNP 481.7*    DDimer  Recent Labs  Lab 12/23/22 1625  DDIMER 0.78*     Radiology/Studies:  ECHOCARDIOGRAM COMPLETE  Result Date: 12/24/2022    ECHOCARDIOGRAM REPORT   Patient Name:   Salahuddin Lumb Date of Exam: 12/24/2022 Medical Rec #:  [id number]     Height:       69.0 in Accession #:    [id number]    Weight:       366.0 lb Date of Birth:  12/03/1980     BSA:          2.671 m Patient Age:    41 years      BP:           174/108 mmHg Patient Gender: M             HR:           98 bpm. Exam Location:  [facility] Procedure: 2D Echo, Cardiac Doppler, Color Doppler and Intracardiac            Opacification Agent Indications:      CHF  History:         Patient has no prior history of Echocardiogram examinations.                  CHF; Risk Factors:Hypertension, Diabetes, Dyslipidemia and                  Sleep Apnea.  Sonographer:     DBB Referring Phys:  [id number] ZACKERY SIRA Diagnosing Phys: Muhammad Arida MD  Sonographer Comments: Patient is obese. IMPRESSIONS  1. Left ventricular ejection fraction, by estimation, is 35 to 40%. The left ventricle has moderately decreased function. The left ventricle demonstrates regional wall motion abnormalities (see scoring diagram/findings for description). There is moderate left ventricular hypertrophy. Left ventricular diastolic parameters are indeterminate. There is moderate   hypokinesis of the left ventricular, basal-mid inferior wall.  2. Right ventricular systolic function is normal. The right ventricular size is normal. There is mildly elevated pulmonary artery systolic pressure.  3. Left atrial size was moderately dilated.  4. The mitral valve is normal in structure. No evidence of mitral valve regurgitation. No evidence of mitral stenosis.  5. The aortic valve is normal in structure. Aortic valve regurgitation is not visualized. No aortic stenosis is present.  6. The inferior vena cava is normal in size with <50% respiratory variability, suggesting right atrial pressure of 8 mmHg. FINDINGS  Left Ventricle: Left ventricular ejection fraction, by estimation, is 35 to 40%. The left ventricle has moderately decreased function. The left ventricle demonstrates regional wall motion abnormalities. Moderate hypokinesis of the left ventricular, basal-mid inferior wall. Definity contrast agent was given IV to delineate the left ventricular endocardial borders. The left ventricular internal cavity size was normal in size. There is moderate left ventricular hypertrophy. Left ventricular diastolic parameters are indeterminate. Right Ventricle: The right ventricular size is normal. No increase in right  ventricular wall thickness. Right ventricular systolic function is normal. There is mildly elevated pulmonary artery systolic pressure. The tricuspid regurgitant velocity is 2.57  m/s, and with an assumed right atrial pressure of 8 mmHg, the estimated right ventricular systolic pressure is 34.4 mmHg. Left Atrium: Left atrial size was moderately dilated. Right Atrium: Right atrial size was normal in size. Pericardium: There is no evidence of pericardial effusion. Mitral Valve: The mitral valve is normal in structure. No evidence of mitral valve regurgitation. No evidence of mitral valve stenosis. MV peak gradient, 4.0 mmHg. The mean mitral valve gradient is 2.0 mmHg. Tricuspid Valve: The tricuspid valve is normal in structure. Tricuspid valve regurgitation is trivial. No evidence of tricuspid stenosis. Aortic Valve: The aortic valve is normal in structure. Aortic valve regurgitation is not visualized. No aortic stenosis is present. Aortic valve mean gradient measures 3.0 mmHg. Aortic valve peak gradient measures 5.9 mmHg. Aortic valve area, by VTI measures 2.87 cm. Pulmonic Valve: The pulmonic valve was normal in structure. Pulmonic valve regurgitation is trivial. No evidence of pulmonic stenosis. Aorta: The aortic root is normal in size and structure. Venous: The inferior vena cava is normal in size with less than 50% respiratory variability, suggesting right atrial pressure of 8 mmHg. IAS/Shunts: No atrial level shunt detected by color flow Doppler.  LEFT VENTRICLE PLAX 2D LVIDd:         4.80 cm LVIDs:         4.00 cm LV PW:         1.50 cm LV IVS:        1.40 cm LVOT diam:     2.30 cm LV SV:         71 LV SV Index:   26 LVOT Area:     4.15 cm  RIGHT VENTRICLE RV Basal diam:  3.15 cm RV Mid diam:    2.20 cm LEFT ATRIUM              Index        RIGHT ATRIUM           Index LA diam:        5.00 cm  1.87 cm/m   RA Area:     18.40 cm LA Vol (A2C):   126.0 ml 47.17 ml/m  RA Volume:   47.40 ml  17.75 ml/m LA Vol  (A4C):   58.4 ml  21.86 ml/m   LA Biplane Vol: 88.3 ml  33.06 ml/m  AORTIC VALVE                    PULMONIC VALVE AV Area (Vmax):    3.24 cm     PV Vmax:       1.11 m/s AV Area (Vmean):   3.26 cm     PV Peak grad:  4.9 mmHg AV Area (VTI):     2.87 cm AV Vmax:           121.00 cm/s AV Vmean:          88.400 cm/s AV VTI:            0.246 m AV Peak Grad:      5.9 mmHg AV Mean Grad:      3.0 mmHg LVOT Vmax:         94.50 cm/s LVOT Vmean:        69.400 cm/s LVOT VTI:          0.170 m LVOT/AV VTI ratio: 0.69  AORTA Ao Root diam: 3.70 cm Ao Asc diam:  2.80 cm MITRAL VALVE                TRICUSPID VALVE MV Area (PHT): 4.71 cm     TR Peak grad:   26.4 mmHg MV Area VTI:   2.93 cm     TR Vmax:        257.00 cm/s MV Peak grad:  4.0 mmHg MV Mean grad:  2.0 mmHg     SHUNTS MV Vmax:       1.00 m/s     Systemic VTI:  0.17 m MV Vmean:      59.4 cm/s    Systemic Diam: 2.30 cm MV Decel Time: 161 msec MV E velocity: 102.00 cm/s MV A velocity: 87.70 cm/s MV E/A ratio:  1.16 Kathlyn Sacramento MD Electronically signed by Kathlyn Sacramento MD Signature Date/Time: 12/24/2022/11:57:18 AM    Final    CT Angio Chest Pulmonary Embolism (PE) W or WO Contrast  Result Date: 12/23/2022 CLINICAL DATA:  Short of breath for 1 month, productive cough for 2 weeks, COVID positive EXAM: CT ANGIOGRAPHY CHEST WITH CONTRAST TECHNIQUE: Multidetector CT imaging of the chest was performed using the standard protocol during bolus administration of intravenous contrast. Multiplanar CT image reconstructions and MIPs were obtained to evaluate the vascular anatomy. RADIATION DOSE REDUCTION: This exam was performed according to the departmental dose-optimization program which includes automated exposure control, adjustment of the mA and/or kV according to patient size and/or use of iterative reconstruction technique. CONTRAST:  22mL OMNIPAQUE IOHEXOL 350 MG/ML SOLN COMPARISON:  12/23/2022 FINDINGS: Cardiovascular: This is a technically adequate evaluation of the  pulmonary vasculature. No filling defects or pulmonary emboli. Cardiomegaly without pericardial effusion. No evidence of thoracic aortic aneurysm or dissection. Mediastinum/Nodes: Borderline enlarged mediastinal and right hilar adenopathy, largest in the right paratracheal region measuring 13 mm in short axis. Thyroid, trachea, and esophagus are unremarkable. Lungs/Pleura: There are minimal scattered ground-glass opacities seen bilaterally, most pronounced within the lower lobes. There is associated bronchial wall thickening, greatest at the lung bases. No effusion or pneumothorax. Upper Abdomen: No acute abnormality. Musculoskeletal: No acute or destructive bony lesions. Reconstructed images demonstrate no additional findings. Review of the MIP images confirms the above findings. IMPRESSION: 1. No evidence of pulmonary embolus. 2. Basilar predominant bronchial wall thickening with scattered ground-glass airspace disease, most consistent with bronchopneumonia. Atypical viral etiologies such as COVID could  be considered. 3. Mild cardiomegaly. Electronically Signed   By: Randa Ngo M.D.   On: 12/23/2022 22:16   DG Chest 2 View  Result Date: 12/23/2022 CLINICAL DATA:  Shortness of breath with exertion for 1 month. COVID-19 positive. EXAM: CHEST - 2 VIEW COMPARISON:  03/16/2019 FINDINGS: Lateral view is motion degraded. The frontal view is degraded by patient body habitus. Midline trachea. Mild cardiomegaly, accentuated by low lung volumes. No pleural effusion or pneumothorax. Diffuse interstitial prominence and indistinctness. IMPRESSION: Mild cardiomegaly with diffuse interstitial prominence. Given absence of pleural effusions, viral or mycoplasma pneumonia favored over pulmonary edema. Not a typical appearance of COVID-19 pneumonia, which cannot be excluded. Electronically Signed   By: Abigail Miyamoto M.D.   On: 12/23/2022 14:49     Assessment and Plan:   New onset HFrEF with complaints of shortness of  breath over the last month -Patient presented with complaints of worsening progressive shortness of breath x 1 month -Previously given antibiotics on Wednesday by PCP for concern of pneumonia -BNP elevated 481.7 -Echocardiogram revealed LVEF 35-40% with regional wall motion abnormality noted - -2.3 L of output in the last 24 hours -Continued on furosemide 40 mg IV twice daily -Continue Farxiga, Toprol-XL, Avapro, can consider increasing [place] and changing Avapro to [employer] -Continue to escalate GDMT as tolerated by blood pressure and kidney function -Heart failure education -Daily weight, I&O, low sodium diet -NPO after midnight, R/LHC for pending AM  COVID-19 infection with pneumonia -continue with antibiotics -supportive care -management per IM  Hypertension -blood pressure 143/95 -continued on 10 mg, furosemide 40 mg p.o. twice daily, Toprol-XL 100 mg daily, and Avapro 300 mg daily -vitals per unit protocol  Slightly elevated high-sensitivity troponin likely secondary to hypertensive urgency, COVID, elevated BNP from new onset heart failure -currently remains chest pain free -no ischemic changes noted on EKG -wall motion abnormalities noted on EKG -hs troponins trended 19 and 23 -with new onset HFrEF will need ischemic work-up  Type 2 diabetes -Hgb A1c 6.0 -continued on insulin therapy -management per IM  CKD IIIa -serum creatinine 1.58 -baseline serum creatinine 1.3-1.5 -monitor urine output -daily bmp -monitor/trend/replete electrolytes as needed -avoid nephrotoxic agents where able  Shared Decision Making/Informed Consent The risks [stroke (1 in 1000), death (1 in 1000), kidney failure [usually temporary] (1 in 500), bleeding (1 in 200), allergic reaction [possibly serious] (1 in 200)], benefits (diagnostic support and management of coronary artery disease) and alternatives of a cardiac catheterization were discussed in detail with Mr. Macgowan and he is willing to  proceed.   Risk Assessment/Risk Scores:        New York Heart Association (NYHA) Functional Class NYHA Class III  For questions or updates, please contact [place] Please consult [email or web address] for contact info under    Signed, Deaven Urwin, NP  12/24/2022 12:27 PM

## 2022-12-24 NOTE — Assessment & Plan Note (Signed)
Diagnosed yesterday at urgent care.  Chest imaging with bilateral groundglass opacities, procalcitonin negative.  Elevated D-dimer most likely secondary to COVID-19 as CTA was negative for PE.  No oxygen requirement -Start him on Paxlovid -Supportive care

## 2022-12-24 NOTE — Assessment & Plan Note (Signed)
Blood pressure mildly elevated.  On multiple home antihypertensives. -Continue home meds

## 2022-12-24 NOTE — H&P (View-Only) (Signed)
Cardiology Consultation   Patient ID: Jordan Greer MRN: [id number]; DOB: Jan 16, 1981  Admit date: 12/23/2022 Date of Consult: 12/24/2022  PCP:  Wilhelmina Mcardle, MD   [place] Providers Cardiologist:  None      New Consult done by Dr. Fletcher Anon  Patient Profile:   Jordan Greer is a 42 y.o. male with a hx of hypertension, sleep apnea on CPAP, diabetes, and obesity, who is being seen 12/24/2022 for the evaluation of new onset HFrEF at the request of Dr. Reesa Chew.  History of Present Illness:   [name] is a 42 year old male with a past medical history of hypertension, sleep apnea on CPAP, diabetes, and obesity.  He presented to the [facility] emergency department on 2//24 stating that over the last month he has had progressively worsening shortness of breath particular with exertion.  He also had some intermittent chills and had several colds off and on.  He states over the last week he began having increased cough with some sputum production as well as generalized fatigue.  Symptoms had worsened and he had opted to go to urgent care earlier during the day and was told that he was COVID-positive.  He had been started on antibiotic by his PCP several days prior.  States he is actually been improving from the COVID perspective.  He states that he had chest x-ray done in urgent care yesterday and was told to report to the emergency department due to concern for possible cardiomegaly.  He stated he had no history of heart failure that he was aware of but he notes that he has taken furosemide in the past.  He does have a history of sleep apnea.  He denied any chest pain, palpitations, lightheadedness/dizziness  but endorsed progressive shortness of breath, cough, and fatigue.  Initial vitals: Blood pressure 120/92, pulse of 97, respirations 20, temperature 97.7  Pertinent labs: Blood glucose 126, serum creatinine 1.59, GFR 55, BNP 41.7, high-sensitivity troponin 19 and 23, CBC was unremarkable,  procalcitonin was less than 10, D-dimer 0.78  Imaging: Chest x-ray revealed mild cardiomegaly with diffuse interstitial prominence, given absence of pleural effusions, bilateral mycoplasma pneumonia favored over pulmonary edema, not a typical appearance of COVID-19 pneumonia which cannot be excluded; CT angio of the chest shows no evidence of pulmonary embolism, basilar predominant bronchial wall thickening with scattered groundglass airspace disease, most consistent with bronchopneumonia, atypical viral etiology such as COVID can be considered, mild cardiomegaly  Medications received in the emergency department: Furosemide 40 mg IVP  Cardiology was consulted this afternoon after echocardiogram results revealed new HFrEF with an LVEF of EF of 35-40% and wall motion abnormality noted. Patient did endorse have some stabbing like chest pains that started off and on about 2 months ago with exertion. He has not had them recently just the progressive shortness of breath.  Past Medical History:  Diagnosis Date   Colitis    Colitis    Diabetes mellitus without complication ([place])    Hyperlipidemia    Hypertension     Past Surgical History:  Procedure Laterality Date   none       Home Medications:  Prior to Admission medications   Medication Sig Start Date End Date Taking? Authorizing Provider  albuterol (VENTOLIN HFA) 108 (90 Base) MCG/ACT inhaler Inhale 2 puffs into the lungs every 6 (six) hours as needed for shortness of breath or wheezing. 12/23/22 12/23/23  [provider]  amLODipine (NORVASC) 10 MG tablet Take 10 mg by mouth daily.  01/30/19   [provider]  amoxicillin-clavulanate (AUGMENTIN) 875-125 MG tablet Take 1 tablet by mouth 2 (two) times daily. 12/19/22   [provider]  atorvastatin (LIPITOR) 20 MG tablet Take 20 mg by mouth daily.    [provider]  FARXIGA 5 MG TABS tablet Take 5 mg by mouth daily.    [provider]  furosemide  (LASIX) 40 MG tablet Take 40 mg by mouth daily. 01/30/19   [provider]  glimepiride (AMARYL) 4 MG tablet Take 4 mg by mouth in the morning and at bedtime. 01/26/20   [provider]  glipiZIDE (GLUCOTROL XL) 10 MG 24 hr tablet Take 10 mg by mouth 2 (two) times daily.     [provider]  metFORMIN (GLUCOPHAGE-XR) 500 MG 24 hr tablet Take 1,000 mg by mouth 2 (two) times daily.    [provider]  metoprolol succinate (TOPROL-XL) 100 MG 24 hr tablet Take 100 mg by mouth daily. Take with or immediately following a meal.    [provider]  NIFEdipine (ADALAT CC) 60 MG 24 hr tablet Take 1 tablet (60 mg total) by mouth daily. 03/01/19   Lang Snow, NP  olmesartan (BENICAR) 40 MG tablet Take 40 mg by mouth daily. 01/26/20   [provider]  OZEMPIC, 2 MG/DOSE, 8 MG/3ML SOPN Inject 2 mg into the skin once a week.    [provider]  rosuvastatin (CRESTOR) 20 MG tablet Take 20 mg by mouth at bedtime. 09/07/21   [provider]  vitamin C (VITAMIN C) 500 MG tablet Take 1 tablet (500 mg total) by mouth daily. 03/01/19   Lang Snow, NP    Inpatient Medications: Scheduled Meds:  amLODipine  10 mg Oral Daily   ascorbic acid  500 mg Oral BID   aspirin EC  81 mg Oral Daily   atorvastatin  20 mg Oral Daily   dapagliflozin propanediol  5 mg Oral Daily   doxycycline  100 mg Oral Q12H   furosemide  40 mg Intravenous BID   guaiFENesin-dextromethorphan  10 mL Oral Q6H   heparin injection (subcutaneous)  5,000 Units Subcutaneous Q8H   insulin aspart  0-5 Units Subcutaneous QHS   insulin aspart  0-9 Units Subcutaneous TID WC   Ipratropium-Albuterol  1 puff Inhalation Q6H   irbesartan  300 mg Oral Daily   metoprolol succinate  100 mg Oral Daily   Continuous Infusions:  PRN Meds: acetaminophen  Allergies:    Allergies  Allergen Reactions   Fish Allergy Itching and Nausea And Vomiting    Social History:    Social History   Socioeconomic History   Marital status: Married    Spouse name: Not on file   Number of children: Not on file   Years of education: Not on file   Highest education level: Not on file  Occupational History   Not on file  Tobacco Use   Smoking status: Never   Smokeless tobacco: Never  Substance and Sexual Activity   Alcohol use: Yes    Alcohol/week: 18.0 standard drinks of alcohol    Types: 18 Cans of beer per week   Drug use: No   Sexual activity: Yes  Other Topics Concern   Not on file  Social History Narrative   Not on file   Social Determinants of Health   Financial Resource Strain: Not on file  Food Insecurity: No Food Insecurity (12/24/2022)   Hunger Vital Sign  Worried About Programme researcher, broadcasting/film/video in the Last Year: Never true    Ran Out of Food in the Last Year: Never true  Transportation Needs: No Transportation Needs (12/24/2022)   PRAPARE - Administrator, Civil Service (Medical): No    Lack of Transportation (Non-Medical): No  Physical Activity: Not on file  Stress: Not on file  Social Connections: Not on file  Intimate Partner Violence: Not At Risk (12/24/2022)   Humiliation, Afraid, Rape, and Kick questionnaire    Fear of Current or Ex-Partner: No    Emotionally Abused: No    Physically Abused: No    Sexually Abused: No    Family History:    Family History  Problem Relation Age of Onset   Hypertension Mother    Diabetes Mellitus II Mother      ROS:  Please see the history of present illness.  Review of Systems  Constitutional:  Positive for malaise/fatigue.  Respiratory:  Positive for cough and shortness of breath.   Neurological:  Positive for weakness.    All other ROS reviewed and negative.     Physical Exam/Data:   Vitals:   12/24/22 0958 12/24/22 1000 12/24/22 1100 12/24/22 1200  BP: (!) 153/109 (!) 140/98 118/72 (!) 143/95  Pulse: 99 92 100   Resp:   (!) 24   Temp:    98.6 F (37 C)  TempSrc:    Oral   SpO2:  97% 99%   Weight:      Height:        Intake/Output Summary (Last 24 hours) at 12/24/2022 1227 Last data filed at 12/24/2022 0700 Gross per 24 hour  Intake --  Output 2300 ml  Net -2300 ml      12/23/2022    2:15 PM 06/12/2019    8:39 AM 03/16/2019    1:31 PM  Last 3 Weights  Weight (lbs) 366 lb 355 lb 12.8 oz 352 lb 6.4 oz  Weight (kg) 166.017 kg 161.39 kg 159.848 kg     Body mass index is 54.05 kg/m.  General:  Well nourished, well developed, in no acute distress HEENT: normal, glasses on Neck: difficult to assess JVD due to body habitus Vascular: No carotid bruits; Distal pulses 2+ bilaterally Cardiac:  normal S1, S2; RRR; no murmur  Lungs:  clear to auscultation bilaterally, no wheezing, rhonchi or rales, respirations are unlabored at rest on room air Abd: soft, nontender, no hepatomegaly  Ext: no edema Musculoskeletal:  No deformities, BUE and BLE strength normal and equal Skin: warm and dry  Neuro:  CNs 2-12 intact, no focal abnormalities noted Psych:  Normal affect   EKG:  The EKG was personally reviewed and demonstrates:  sinus rate of 95 with right axis deviation  Telemetry:  Telemetry was personally reviewed and demonstrates:  sinus rates 80-90  Relevant CV Studies: TTE 12/24/22 1. Left ventricular ejection fraction, by estimation, is 35 to 40%. The  left ventricle has moderately decreased function. The left ventricle  demonstrates regional wall motion abnormalities (see scoring  diagram/findings for description). There is  moderate left ventricular hypertrophy. Left ventricular diastolic  parameters are indeterminate. There is moderate hypokinesis of the left  ventricular, basal-mid inferior wall.   2. Right ventricular systolic function is normal. The right ventricular  size is normal. There is mildly elevated pulmonary artery systolic  pressure.   3. Left atrial size was moderately dilated.   4. The mitral valve is normal in structure. No evidence of  mitral valve  regurgitation. No evidence of mitral stenosis.   5. The aortic valve is normal in structure. Aortic valve regurgitation is  not visualized. No aortic stenosis is present.   6. The inferior vena cava is normal in size with <50% respiratory  variability, suggesting right atrial pressure of 8 mmHg.    Laboratory Data:  High Sensitivity Troponin:   Recent Labs  Lab 12/23/22 1417 12/23/22 1632  TROPONINIHS 19* 23*     Chemistry Recent Labs  Lab 12/23/22 1418 12/23/22 2224 12/24/22 0245  NA 139 138 138  K 3.9 3.2* 3.6  CL 103 101 103  CO2 24 29 26   GLUCOSE 126* 106* 99  BUN 19 19 18   CREATININE 1.59* 1.59* 1.58*  CALCIUM 9.4 8.9 8.7*  MG  --  2.1 2.1  GFRNONAA 55* 55* 55*  ANIONGAP 12 8 9     Recent Labs  Lab 12/23/22 1418 12/24/22 0245  PROT 6.9 6.4*  ALBUMIN 3.8 3.5  AST 21 20  ALT 23 21  ALKPHOS 56 48  BILITOT 1.0 1.1   Lipids  Recent Labs  Lab 12/24/22 0245  CHOL 118  TRIG 108  HDL 36*  LDLCALC 60  CHOLHDL 3.3    Hematology Recent Labs  Lab 12/23/22 1418 12/24/22 0245  WBC 7.3 7.6  RBC 5.06 4.55  HGB 14.9 13.5  HCT 46.3 41.3  MCV 91.5 90.8  MCH 29.4 29.7  MCHC 32.2 32.7  RDW 14.7 14.7  PLT 303 260   Thyroid No results for input(s): "TSH", "FREET4" in the last 168 hours.  BNP Recent Labs  Lab 12/23/22 1417  BNP 481.7*    DDimer  Recent Labs  Lab 12/23/22 1625  DDIMER 0.78*     Radiology/Studies:  ECHOCARDIOGRAM COMPLETE  Result Date: 12/24/2022    ECHOCARDIOGRAM REPORT   Patient Name:   YAHSHUA HASSANI Date of Exam: 12/24/2022 Medical Rec #:  [id number]     Height:       69.0 in Accession #:    [id number]    Weight:       366.0 lb Date of Birth:  November 26, 1980     BSA:          2.671 m Patient Age:    74 years      BP:           174/108 mmHg Patient Gender: M             HR:           98 bpm. Exam Location:  [facility] Procedure: 2D Echo, Cardiac Doppler, Color Doppler and Intracardiac            Opacification Agent Indications:      CHF  History:         Patient has no prior history of Echocardiogram examinations.                  CHF; Risk Factors:Hypertension, Diabetes, Dyslipidemia and                  Sleep Apnea.  Sonographer:     DBB Referring Phys:  [id number] Lucienne Minks Diagnosing Phys: Kathlyn Sacramento MD  Sonographer Comments: Patient is obese. IMPRESSIONS  1. Left ventricular ejection fraction, by estimation, is 35 to 40%. The left ventricle has moderately decreased function. The left ventricle demonstrates regional wall motion abnormalities (see scoring diagram/findings for description). There is moderate left ventricular hypertrophy. Left ventricular diastolic parameters are indeterminate. There is moderate  hypokinesis of the left ventricular, basal-mid inferior wall.  2. Right ventricular systolic function is normal. The right ventricular size is normal. There is mildly elevated pulmonary artery systolic pressure.  3. Left atrial size was moderately dilated.  4. The mitral valve is normal in structure. No evidence of mitral valve regurgitation. No evidence of mitral stenosis.  5. The aortic valve is normal in structure. Aortic valve regurgitation is not visualized. No aortic stenosis is present.  6. The inferior vena cava is normal in size with <50% respiratory variability, suggesting right atrial pressure of 8 mmHg. FINDINGS  Left Ventricle: Left ventricular ejection fraction, by estimation, is 35 to 40%. The left ventricle has moderately decreased function. The left ventricle demonstrates regional wall motion abnormalities. Moderate hypokinesis of the left ventricular, basal-mid inferior wall. Definity contrast agent was given IV to delineate the left ventricular endocardial borders. The left ventricular internal cavity size was normal in size. There is moderate left ventricular hypertrophy. Left ventricular diastolic parameters are indeterminate. Right Ventricle: The right ventricular size is normal. No increase in right  ventricular wall thickness. Right ventricular systolic function is normal. There is mildly elevated pulmonary artery systolic pressure. The tricuspid regurgitant velocity is 2.57  m/s, and with an assumed right atrial pressure of 8 mmHg, the estimated right ventricular systolic pressure is 99991111 mmHg. Left Atrium: Left atrial size was moderately dilated. Right Atrium: Right atrial size was normal in size. Pericardium: There is no evidence of pericardial effusion. Mitral Valve: The mitral valve is normal in structure. No evidence of mitral valve regurgitation. No evidence of mitral valve stenosis. MV peak gradient, 4.0 mmHg. The mean mitral valve gradient is 2.0 mmHg. Tricuspid Valve: The tricuspid valve is normal in structure. Tricuspid valve regurgitation is trivial. No evidence of tricuspid stenosis. Aortic Valve: The aortic valve is normal in structure. Aortic valve regurgitation is not visualized. No aortic stenosis is present. Aortic valve mean gradient measures 3.0 mmHg. Aortic valve peak gradient measures 5.9 mmHg. Aortic valve area, by VTI measures 2.87 cm. Pulmonic Valve: The pulmonic valve was normal in structure. Pulmonic valve regurgitation is trivial. No evidence of pulmonic stenosis. Aorta: The aortic root is normal in size and structure. Venous: The inferior vena cava is normal in size with less than 50% respiratory variability, suggesting right atrial pressure of 8 mmHg. IAS/Shunts: No atrial level shunt detected by color flow Doppler.  LEFT VENTRICLE PLAX 2D LVIDd:         4.80 cm LVIDs:         4.00 cm LV PW:         1.50 cm LV IVS:        1.40 cm LVOT diam:     2.30 cm LV SV:         71 LV SV Index:   26 LVOT Area:     4.15 cm  RIGHT VENTRICLE RV Basal diam:  3.15 cm RV Mid diam:    2.20 cm LEFT ATRIUM              Index        RIGHT ATRIUM           Index LA diam:        5.00 cm  1.87 cm/m   RA Area:     18.40 cm LA Vol (A2C):   126.0 ml 47.17 ml/m  RA Volume:   47.40 ml  17.75 ml/m LA Vol  (A4C):   58.4 ml  21.86 ml/m  LA Biplane Vol: 88.3 ml  33.06 ml/m  AORTIC VALVE                    PULMONIC VALVE AV Area (Vmax):    3.24 cm     PV Vmax:       1.11 m/s AV Area (Vmean):   3.26 cm     PV Peak grad:  4.9 mmHg AV Area (VTI):     2.87 cm AV Vmax:           121.00 cm/s AV Vmean:          88.400 cm/s AV VTI:            0.246 m AV Peak Grad:      5.9 mmHg AV Mean Grad:      3.0 mmHg LVOT Vmax:         94.50 cm/s LVOT Vmean:        69.400 cm/s LVOT VTI:          0.170 m LVOT/AV VTI ratio: 0.69  AORTA Ao Root diam: 3.70 cm Ao Asc diam:  2.80 cm MITRAL VALVE                TRICUSPID VALVE MV Area (PHT): 4.71 cm     TR Peak grad:   26.4 mmHg MV Area VTI:   2.93 cm     TR Vmax:        257.00 cm/s MV Peak grad:  4.0 mmHg MV Mean grad:  2.0 mmHg     SHUNTS MV Vmax:       1.00 m/s     Systemic VTI:  0.17 m MV Vmean:      59.4 cm/s    Systemic Diam: 2.30 cm MV Decel Time: 161 msec MV E velocity: 102.00 cm/s MV A velocity: 87.70 cm/s MV E/A ratio:  1.16 Kathlyn Sacramento MD Electronically signed by Kathlyn Sacramento MD Signature Date/Time: 12/24/2022/11:57:18 AM    Final    CT Angio Chest Pulmonary Embolism (PE) W or WO Contrast  Result Date: 12/23/2022 CLINICAL DATA:  Short of breath for 1 month, productive cough for 2 weeks, COVID positive EXAM: CT ANGIOGRAPHY CHEST WITH CONTRAST TECHNIQUE: Multidetector CT imaging of the chest was performed using the standard protocol during bolus administration of intravenous contrast. Multiplanar CT image reconstructions and MIPs were obtained to evaluate the vascular anatomy. RADIATION DOSE REDUCTION: This exam was performed according to the departmental dose-optimization program which includes automated exposure control, adjustment of the mA and/or kV according to patient size and/or use of iterative reconstruction technique. CONTRAST:  22mL OMNIPAQUE IOHEXOL 350 MG/ML SOLN COMPARISON:  12/23/2022 FINDINGS: Cardiovascular: This is a technically adequate evaluation of the  pulmonary vasculature. No filling defects or pulmonary emboli. Cardiomegaly without pericardial effusion. No evidence of thoracic aortic aneurysm or dissection. Mediastinum/Nodes: Borderline enlarged mediastinal and right hilar adenopathy, largest in the right paratracheal region measuring 13 mm in short axis. Thyroid, trachea, and esophagus are unremarkable. Lungs/Pleura: There are minimal scattered ground-glass opacities seen bilaterally, most pronounced within the lower lobes. There is associated bronchial wall thickening, greatest at the lung bases. No effusion or pneumothorax. Upper Abdomen: No acute abnormality. Musculoskeletal: No acute or destructive bony lesions. Reconstructed images demonstrate no additional findings. Review of the MIP images confirms the above findings. IMPRESSION: 1. No evidence of pulmonary embolus. 2. Basilar predominant bronchial wall thickening with scattered ground-glass airspace disease, most consistent with bronchopneumonia. Atypical viral etiologies such as COVID could  be considered. 3. Mild cardiomegaly. Electronically Signed   By: Randa Ngo M.D.   On: 12/23/2022 22:16   DG Chest 2 View  Result Date: 12/23/2022 CLINICAL DATA:  Shortness of breath with exertion for 1 month. COVID-19 positive. EXAM: CHEST - 2 VIEW COMPARISON:  03/16/2019 FINDINGS: Lateral view is motion degraded. The frontal view is degraded by patient body habitus. Midline trachea. Mild cardiomegaly, accentuated by low lung volumes. No pleural effusion or pneumothorax. Diffuse interstitial prominence and indistinctness. IMPRESSION: Mild cardiomegaly with diffuse interstitial prominence. Given absence of pleural effusions, viral or mycoplasma pneumonia favored over pulmonary edema. Not a typical appearance of COVID-19 pneumonia, which cannot be excluded. Electronically Signed   By: Abigail Miyamoto M.D.   On: 12/23/2022 14:49     Assessment and Plan:   New onset HFrEF with complaints of shortness of  breath over the last month -Patient presented with complaints of worsening progressive shortness of breath x 1 month -Previously given antibiotics on Wednesday by PCP for concern of pneumonia -BNP elevated 481.7 -Echocardiogram revealed LVEF 35-40% with regional wall motion abnormality noted - -2.3 L of output in the last 24 hours -Continued on furosemide 40 mg IV twice daily -Continue Farxiga, Toprol-XL, Avapro, can consider increasing [place] and changing Avapro to [employer] -Continue to escalate GDMT as tolerated by blood pressure and kidney function -Heart failure education -Daily weight, I&O, low sodium diet -NPO after midnight, R/LHC for pending AM  COVID-19 infection with pneumonia -continue with antibiotics -supportive care -management per IM  Hypertension -blood pressure 143/95 -continued on 10 mg, furosemide 40 mg p.o. twice daily, Toprol-XL 100 mg daily, and Avapro 300 mg daily -vitals per unit protocol  Slightly elevated high-sensitivity troponin likely secondary to hypertensive urgency, COVID, elevated BNP from new onset heart failure -currently remains chest pain free -no ischemic changes noted on EKG -wall motion abnormalities noted on EKG -hs troponins trended 19 and 23 -with new onset HFrEF will need ischemic work-up  Type 2 diabetes -Hgb A1c 6.0 -continued on insulin therapy -management per IM  CKD IIIa -serum creatinine 1.58 -baseline serum creatinine 1.3-1.5 -monitor urine output -daily bmp -monitor/trend/replete electrolytes as needed -avoid nephrotoxic agents where able  Shared Decision Making/Informed Consent The risks [stroke (1 in 1000), death (1 in 1000), kidney failure [usually temporary] (1 in 500), bleeding (1 in 200), allergic reaction [possibly serious] (1 in 200)], benefits (diagnostic support and management of coronary artery disease) and alternatives of a cardiac catheterization were discussed in detail with Mr. Macgowan and he is willing to  proceed.   Risk Assessment/Risk Scores:        New York Heart Association (NYHA) Functional Class NYHA Class III  For questions or updates, please contact [place] Please consult [email or web address] for contact info under    Signed, Kimm Ungaro, NP  12/24/2022 12:27 PM

## 2022-12-24 NOTE — Assessment & Plan Note (Signed)
Lipid panel mostly within normal limit. -Holding home Lipitor while patient is on Paxlovid

## 2022-12-24 NOTE — ED Notes (Signed)
Informed RN bed assigned 

## 2022-12-24 NOTE — Assessment & Plan Note (Signed)
Renal function seems stable and consistent with CKD stage IIIa. -Monitor renal function -Avoid nephrotoxins

## 2022-12-24 NOTE — Assessment & Plan Note (Signed)
Seems well-controlled with A1c of 6. -SSI -Continue Farxiga

## 2022-12-24 NOTE — Progress Notes (Signed)
Progress Note   Patient: Jordan Greer ZOX:[id number] DOB: 05-Jun-1981 DOA: 12/23/2022     0 DOS: the patient was seen and examined on 12/24/2022   Brief hospital course: Taken from H&P   Jordan Greer is a 42 y.o. male who presents to the hospital with SOB. On Wed he went to his PCP because he was coughing up whitish sputum. 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 This morning he went to the urgent care center and had a COVID test completed which was positive. He was also told that his BP was high and that he should immediately go to the ER.  Patient was having exertional dyspnea for the past 1 month.  ED course.  Was found to have elevated blood pressure at 179/132.  Lower extremity edema.  Labs pertinent for BNP elevated at 481, troponin 19>23, procalcitonin negative, D-dimer at 0.78.  Unremarkable CBC and renal function seems to be around his baseline. CTA negative for PE.  Did show bibasilar predominant bronchial wall thickening with scattered groundglass airspace disease, most consistent with bronchopneumonia.  Atypical viral etiology such as COVID could be considered.  Mild cardiomegaly.  2/5: Vitals with mild tachycardia and blood pressure remained elevated at 143/95.  Patient was on multiple antihypertensives at home which were restarted. Lipid panel without any significant abnormality.  LDL 60.  A1c of 6.  Renal functions seems to be stable and consistent with CKD stage III A.  Hypokalemia resolved. Echocardiogram with EF of 35 to 40% with regional wall motion abnormalities and hypokinesis of left ventricular, basal-mid inferior wall.  Dilated left atria.  Cardiology was consulted.  Assessment and Plan: * Acute HFrEF (heart failure with reduced ejection fraction) ([place]) Patient with exertional dyspnea, orthopnea and lower extremity edema.  BNP elevated at 481.  Echocardiogram with new diagnosis of EF of 35 to 40% with regional wall motion abnormalities.  Cardiology  was consulted. -Continue with IV Lasix -Strict intake and output -Daily weight and BMP  Obesity, Class III, BMI 40-49.9 (morbid obesity) ([place]) Estimated body mass index is 54.05 kg/m as calculated from the following:   Height as of this encounter: 5\' 9"  (1.753 m).   Weight as of this encounter: 166 kg.   -This will complicate overall prognosis -Counseling was provided  COVID-19 Diagnosed yesterday at urgent care.  Chest imaging with bilateral groundglass opacities, procalcitonin negative.  Elevated D-dimer most likely secondary to COVID-19 as CTA was negative for PE.  No oxygen requirement -Start him on Paxlovid -Supportive care  Type 2 diabetes mellitus with hyperlipidemia ([place]) Seems well-controlled with A1c of 6. -SSI -Continue Farxiga  Hypertension associated with diabetes ([place]) Blood pressure mildly elevated.  On multiple home antihypertensives. -Continue home meds  Hyperlipidemia Lipid panel mostly within normal limit. -Holding home Lipitor while patient is on Paxlovid  Chronic kidney disease (CKD), stage III (moderate) (HCC) Renal function seems stable and consistent with CKD stage IIIa. -Monitor renal function -Avoid nephrotoxins    Subjective: Patient was seen and examined today.  Having exertional dyspnea and orthopnea for about 1 month.  Diuresing well  Physical Exam: Vitals:   12/24/22 1000 12/24/22 1100 12/24/22 1200 12/24/22 1351  BP: (!) 140/98 118/72 (!) 143/95 (!) 163/116  Pulse: 92 100    Resp:  (!) 24    Temp:   98.6 F (37 C)   TempSrc:   Oral   SpO2: 97% 99%    Weight:      Height:  General.  Morbidly obese gentleman, in no acute distress. Pulmonary.  Lungs clear bilaterally, normal respiratory effort. CV.  Regular rate and rhythm, no JVD, rub or murmur. Abdomen.  Soft, nontender, nondistended, BS positive. CNS.  Alert and oriented .  No focal neurologic deficit. Extremities.  1+ LE edema, no cyanosis, pulses intact and  symmetrical. Psychiatry.  Judgment and insight appears normal.   Data Reviewed: Prior data reviewed  Family Communication: Discussed with wife at bedside  Disposition: Status is: Inpatient Remains inpatient appropriate because: Severity of illness  Planned Discharge Destination: Home  DVT prophylaxis.  Subcu heparin Time spent: 50 minutes  This record has been created using Systems analyst. Errors have been sought and corrected,but may not always be located. Such creation errors do not reflect on the standard of care.   Author: Lorella Nimrod, MD 12/24/2022 2:11 PM  For on call review [email or web address].

## 2022-12-24 NOTE — Assessment & Plan Note (Signed)
Patient with exertional dyspnea, orthopnea and lower extremity edema.  BNP elevated at 481.  Echocardiogram with new diagnosis of EF of 35 to 40% with regional wall motion abnormalities.  Cardiology was consulted. -Continue with IV Lasix -Strict intake and output -Daily weight and BMP

## 2022-12-25 ENCOUNTER — Encounter
Admission: EM | Disposition: A | Discharge status: Home / Self Care | Payer: Self-pay | Source: Home / Self Care | Attending: Internal Medicine

## 2022-12-25 DIAGNOSIS — E1159 Type 2 diabetes mellitus with other circulatory complications: Secondary | ICD-10-CM | POA: Diagnosis not present

## 2022-12-25 DIAGNOSIS — I152 Hypertension secondary to endocrine disorders: Secondary | ICD-10-CM

## 2022-12-25 DIAGNOSIS — U071 COVID-19: Secondary | ICD-10-CM | POA: Diagnosis not present

## 2022-12-25 DIAGNOSIS — N1831 Chronic kidney disease, stage 3a: Secondary | ICD-10-CM | POA: Diagnosis not present

## 2022-12-25 DIAGNOSIS — I5021 Acute systolic (congestive) heart failure: Secondary | ICD-10-CM | POA: Diagnosis not present

## 2022-12-25 DIAGNOSIS — E785 Hyperlipidemia, unspecified: Secondary | ICD-10-CM | POA: Diagnosis not present

## 2022-12-25 HISTORY — PX: RIGHT/LEFT HEART CATH AND CORONARY ANGIOGRAPHY: CATH118266

## 2022-12-25 LAB — COMPREHENSIVE METABOLIC PANEL
ALT: 23 U/L (ref 0–44)
AST: 24 U/L (ref 15–41)
Albumin: 4.5 g/dL (ref 3.5–5.0)
Alkaline Phosphatase: 65 U/L (ref 38–126)
Anion gap: 10 (ref 5–15)
BUN: 15 mg/dL (ref 6–20)
CO2: 26 mmol/L (ref 22–32)
Calcium: 9 mg/dL (ref 8.9–10.3)
Chloride: 101 mmol/L (ref 98–111)
Creatinine, Ser: 1.47 mg/dL — ABNORMAL HIGH (ref 0.61–1.24)
GFR, Estimated: >60 mL/min (ref >60)
Glucose, Bld: 125 mg/dL — ABNORMAL HIGH (ref 70–99)
Potassium: 3.4 mmol/L — ABNORMAL LOW (ref 3.5–5.1)
Sodium: 137 mmol/L (ref 135–145)
Total Bilirubin: 1.2 mg/dL (ref 0.3–1.2)
Total Protein: 8 g/dL (ref 6.5–8.1)

## 2022-12-25 LAB — GLUCOSE, CAPILLARY
Glucose-Capillary: 133 mg/dL — ABNORMAL HIGH (ref 70–99)
Glucose-Capillary: 143 mg/dL — ABNORMAL HIGH (ref 70–99)
Glucose-Capillary: 152 mg/dL — ABNORMAL HIGH (ref 70–99)
Glucose-Capillary: 92 mg/dL (ref 70–99)

## 2022-12-25 LAB — POCT I-STAT EG7
Acid-Base Excess: 1 mmol/L (ref 0.0–2.0)
Bicarbonate: 25.9 mmol/L (ref 20.0–28.0)
Calcium, Ion: 1.15 mmol/L (ref 1.15–1.40)
HCT: 47 % (ref 39.0–52.0)
Hemoglobin: 16 g/dL (ref 13.0–17.0)
O2 Saturation: 69 %
Potassium: 3.7 mmol/L (ref 3.5–5.1)
Sodium: 141 mmol/L (ref 135–145)
TCO2: 27 mmol/L (ref 22–32)
pCO2, Ven: 40.6 mmHg — ABNORMAL LOW (ref 44–60)
pH, Ven: 7.413 (ref 7.25–7.43)
pO2, Ven: 36 mmHg (ref 32–45)

## 2022-12-25 LAB — D-DIMER, QUANTITATIVE: D-Dimer, Quant: 0.34 ug/mL-FEU (ref 0.00–0.50)

## 2022-12-25 LAB — C-REACTIVE PROTEIN: CRP: 0.6 mg/dL (ref <1.0)

## 2022-12-25 LAB — HIV ANTIBODY (ROUTINE TESTING W REFLEX): HIV Screen 4th Generation wRfx: NONREACTIVE

## 2022-12-25 SURGERY — RIGHT/LEFT HEART CATH AND CORONARY ANGIOGRAPHY
Anesthesia: Moderate Sedation

## 2022-12-25 MED ORDER — ENOXAPARIN SODIUM 40 MG/0.4ML IJ SOSY
40.0000 mg | PREFILLED_SYRINGE | INTRAMUSCULAR | Status: DC
  Filled 2022-12-25 (×2): qty 0.4

## 2022-12-25 MED ORDER — MIDAZOLAM HCL 2 MG/2ML IJ SOLN
INTRAMUSCULAR | Status: AC
  Filled 2022-12-25: qty 2

## 2022-12-25 MED ORDER — SACUBITRIL-VALSARTAN 97-103 MG PO TABS
1.0000 | ORAL_TABLET | Freq: Two times a day (BID) | ORAL | Status: DC
  Administered 2022-12-26: 1 via ORAL
  Filled 2022-12-25: qty 1

## 2022-12-25 MED ORDER — SODIUM CHLORIDE 0.9% FLUSH: 3.0000 mL | INTRAVENOUS | Status: DC | PRN

## 2022-12-25 MED ORDER — VERAPAMIL HCL 2.5 MG/ML IV SOLN
INTRAVENOUS | Status: DC | PRN
  Administered 2022-12-25 (×2): 2.5 mg via INTRA_ARTERIAL

## 2022-12-25 MED ORDER — HYDRALAZINE HCL 20 MG/ML IJ SOLN
INTRAMUSCULAR | Status: AC
  Filled 2022-12-25: qty 1

## 2022-12-25 MED ORDER — HEPARIN (PORCINE) IN NACL 1000-0.9 UT/500ML-% IV SOLN
INTRAVENOUS | Status: DC | PRN
  Administered 2022-12-25 (×2): 500 mL

## 2022-12-25 MED ORDER — HEPARIN (PORCINE) IN NACL 1000-0.9 UT/500ML-% IV SOLN
INTRAVENOUS | Status: AC
  Filled 2022-12-25: qty 1000

## 2022-12-25 MED ORDER — FENTANYL CITRATE (PF) 100 MCG/2ML IJ SOLN
INTRAMUSCULAR | Status: DC | PRN
  Administered 2022-12-25: 12.5 ug via INTRAVENOUS

## 2022-12-25 MED ORDER — ASPIRIN 81 MG PO CHEW: 81.0000 mg | CHEWABLE_TABLET | ORAL | Status: DC

## 2022-12-25 MED ORDER — ONDANSETRON HCL 4 MG/2ML IJ SOLN: 4.0000 mg | Freq: Four times a day (QID) | INTRAMUSCULAR | Status: DC | PRN

## 2022-12-25 MED ORDER — VERAPAMIL HCL 2.5 MG/ML IV SOLN
INTRAVENOUS | Status: AC
  Filled 2022-12-25: qty 2

## 2022-12-25 MED ORDER — IOHEXOL 300 MG/ML  SOLN
INTRAMUSCULAR | Status: DC | PRN
  Administered 2022-12-25: 30 mL

## 2022-12-25 MED ORDER — HEPARIN SODIUM (PORCINE) 1000 UNIT/ML IJ SOLN
INTRAMUSCULAR | Status: DC | PRN
  Administered 2022-12-25: 5000 [IU] via INTRAVENOUS

## 2022-12-25 MED ORDER — ACETAMINOPHEN 325 MG PO TABS
650.0000 mg | ORAL_TABLET | ORAL | Status: DC | PRN
  Administered 2022-12-25: 650 mg via ORAL
  Filled 2022-12-25: qty 2

## 2022-12-25 MED ORDER — FENTANYL CITRATE (PF) 100 MCG/2ML IJ SOLN
INTRAMUSCULAR | Status: AC
  Filled 2022-12-25: qty 2

## 2022-12-25 MED ORDER — SODIUM CHLORIDE 0.9 % IV SOLN: 250.0000 mL | INTRAVENOUS | Status: DC | PRN

## 2022-12-25 MED ORDER — POTASSIUM CHLORIDE CRYS ER 20 MEQ PO TBCR
40.0000 meq | EXTENDED_RELEASE_TABLET | Freq: Once | ORAL | Status: AC
  Administered 2022-12-25: 40 meq via ORAL
  Filled 2022-12-25: qty 2

## 2022-12-25 MED ORDER — HEPARIN SODIUM (PORCINE) 1000 UNIT/ML IJ SOLN
INTRAMUSCULAR | Status: AC
  Filled 2022-12-25: qty 10

## 2022-12-25 MED ORDER — SODIUM CHLORIDE 0.9% FLUSH
3.0000 mL | Freq: Two times a day (BID) | INTRAVENOUS | Status: DC
  Administered 2022-12-25 – 2022-12-26 (×2): 3 mL via INTRAVENOUS

## 2022-12-25 MED ORDER — SODIUM CHLORIDE 0.9 % IV SOLN: INTRAVENOUS | Status: DC

## 2022-12-25 MED ORDER — MIDAZOLAM HCL 2 MG/2ML IJ SOLN
INTRAMUSCULAR | Status: DC | PRN
  Administered 2022-12-25: .5 mg via INTRAVENOUS

## 2022-12-25 MED ORDER — SODIUM CHLORIDE 0.9% FLUSH
3.0000 mL | Freq: Two times a day (BID) | INTRAVENOUS | Status: DC
  Administered 2022-12-25: 3 mL via INTRAVENOUS

## 2022-12-25 MED ORDER — HYDRALAZINE HCL 25 MG PO TABS
25.0000 mg | ORAL_TABLET | Freq: Three times a day (TID) | ORAL | Status: DC
  Administered 2022-12-25 – 2022-12-26 (×3): 25 mg via ORAL
  Filled 2022-12-25 (×4): qty 1

## 2022-12-25 MED ORDER — HYDRALAZINE HCL 20 MG/ML IJ SOLN
INTRAMUSCULAR | Status: DC | PRN
  Administered 2022-12-25: 10 mg via INTRAVENOUS

## 2022-12-25 MED ORDER — DAPAGLIFLOZIN PROPANEDIOL 5 MG PO TABS
10.0000 mg | ORAL_TABLET | Freq: Every day | ORAL | Status: DC
  Administered 2022-12-26: 10 mg via ORAL
  Filled 2022-12-25: qty 2

## 2022-12-25 MED ORDER — HYDRALAZINE HCL 20 MG/ML IJ SOLN: 10.0000 mg | INTRAMUSCULAR | Status: AC | PRN

## 2022-12-25 MED ORDER — ENOXAPARIN SODIUM 80 MG/0.8ML IJ SOSY
0.5000 mg/kg | PREFILLED_SYRINGE | INTRAMUSCULAR | Status: DC
  Administered 2022-12-26: 82.5 mg via SUBCUTANEOUS
  Filled 2022-12-25: qty 1.6

## 2022-12-25 SURGICAL SUPPLY — 12 items
CATH 5FR JL3.5 JR4 ANG PIG MP (CATHETERS) IMPLANT
CATH BALLN WEDGE 5F 110CM (CATHETERS) IMPLANT
DEVICE RAD TR BAND REGULAR (VASCULAR PRODUCTS) IMPLANT
DRAPE BRACHIAL (DRAPES) IMPLANT
GLIDESHEATH SLEND SS 6F .021 (SHEATH) IMPLANT
GUIDEWIRE INQWIRE 1.5J.035X260 (WIRE) IMPLANT
INQWIRE 1.5J .035X260CM (WIRE) ×1
PACK CARDIAC CATH (CUSTOM PROCEDURE TRAY) ×1 IMPLANT
PROTECTION STATION PRESSURIZED (MISCELLANEOUS) ×1
SET ATX SIMPLICITY (MISCELLANEOUS) IMPLANT
SHEATH GLIDE SLENDER 4/5FR (SHEATH) IMPLANT
STATION PROTECTION PRESSURIZED (MISCELLANEOUS) IMPLANT

## 2022-12-25 NOTE — Progress Notes (Signed)
Progress Note   Patient: Jordan Greer [id number] DOB: 01/21/1981 DOA: 12/23/2022     1 DOS: the patient was seen and examined on 12/25/2022   Brief hospital course: Taken from H&P   Jordan Greer is a 42 y.o. male who presents to the hospital with SOB. On Wed he went to his PCP because he was coughing up whitish sputum. 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 This morning he went to the urgent care center and had a COVID test completed which was positive. He was also told that his BP was high and that he should immediately go to the ER.  Patient was having exertional dyspnea for the past 1 month.  ED course.  Was found to have elevated blood pressure at 179/132.  Lower extremity edema.  Labs pertinent for BNP elevated at 481, troponin 19>23, procalcitonin negative, D-dimer at 0.78.  Unremarkable CBC and renal function seems to be around his baseline. CTA negative for PE.  Did show bibasilar predominant bronchial wall thickening with scattered groundglass airspace disease, most consistent with bronchopneumonia.  Atypical viral etiology such as COVID could be considered.  Mild cardiomegaly.  2/5: Vitals with mild tachycardia and blood pressure remained elevated at 143/95.  Patient was on multiple antihypertensives at home which were restarted. Lipid panel without any significant abnormality.  LDL 60.  A1c of 6.  Renal functions seems to be stable and consistent with CKD stage III A.  Hypokalemia resolved. Echocardiogram with EF of 35 to 40% with regional wall motion abnormalities and hypokinesis of left ventricular, basal-mid inferior wall.  Dilated left atria.  Cardiology was consulted.  2/6: Blood pressure remained elevated.  Adding scheduled 25 mg of hydralazine.  Going for cardiac catheterization today on new diagnosis of HFrEF.  Assessment and Plan: * Acute HFrEF (heart failure with reduced ejection fraction) ([place]) Patient with exertional dyspnea, orthopnea and  lower extremity edema.  BNP elevated at 481.  Echocardiogram with new diagnosis of EF of 35 to 40% with regional wall motion abnormalities.  Cardiology was consulted. -Going for right and left cardiac catheterization today -Continue with IV Lasix -Strict intake and output -Daily weight and BMP  Obesity, Class III, BMI 40-49.9 (morbid obesity) ([place]) Estimated body mass index is 54.05 kg/m as calculated from the following:   Height as of this encounter: 5\' 9"  (1.753 m).   Weight as of this encounter: 166 kg.   -This will complicate overall prognosis -Counseling was provided  COVID-19 Diagnosed yesterday at urgent care.  Chest imaging with bilateral groundglass opacities, procalcitonin negative.  Elevated D-dimer most likely secondary to COVID-19 as CTA was negative for PE.  No oxygen requirement -Start him on Paxlovid -Supportive care  Type 2 diabetes mellitus with hyperlipidemia ([place]) Seems well-controlled with A1c of 6. -SSI -Continue Farxiga  Hypertension associated with diabetes ([place]) Blood pressure mildly elevated.  On multiple home antihypertensives. -Continue home meds -Add scheduled hydralazine  Hyperlipidemia Lipid panel mostly within normal limit. -Holding home Lipitor while patient is on Paxlovid  Chronic kidney disease (CKD), stage III (moderate) (HCC) Renal function seems stable and consistent with CKD stage IIIa. -Monitor renal function -Avoid nephrotoxins    Subjective: Patient was seen and examined today.  Denies any new complaints.  Awaiting cardiac catheterization.  Physical Exam: Vitals:   12/25/22 0000 12/25/22 0826 12/25/22 1027 12/25/22 1219  BP: (!) 134/94 (!) 178/122 (!) 167/113 (!) 155/116  Pulse: 84     Resp: 16 18  20  Temp: 98 F (36.7 C) 98 F (36.7 C)    TempSrc: Oral Oral    SpO2: 99% 98%  98%  Weight:      Height:       General.  Morbidly obese gentleman, in no acute distress. Pulmonary.  Lungs clear bilaterally, normal  respiratory effort. CV.  Regular rate and rhythm, no JVD, rub or murmur. Abdomen.  Soft, nontender, nondistended, BS positive. CNS.  Alert and oriented .  No focal neurologic deficit. Extremities.  Trace LE edema, no cyanosis, pulses intact and symmetrical. Psychiatry.  Judgment and insight appears normal.    Data Reviewed: Prior data reviewed  Family Communication: Discussed with patient  Disposition: Status is: Inpatient Remains inpatient appropriate because: Severity of illness  Planned Discharge Destination: Home  DVT prophylaxis.  Subcu heparin Time spent: 45 minutes  This record has been created using Systems analyst. Errors have been sought and corrected,but may not always be located. Such creation errors do not reflect on the standard of care.   Author: Lorella Nimrod, MD 12/25/2022 1:42 PM  For on call review [email or web address].

## 2022-12-25 NOTE — Assessment & Plan Note (Signed)
Renal function seems stable and consistent with CKD stage IIIa. -Monitor renal function -Avoid nephrotoxins 

## 2022-12-25 NOTE — Progress Notes (Signed)
TR band removed at 1655; vital signs are stable and within patient baseline; no bleeding noted; no hematoma noted.

## 2022-12-25 NOTE — Progress Notes (Signed)
Rounding Note    Patient Name: Jordan Greer Date of Encounter: 12/25/2022  [facility] Cardiologist: New  Subjective   Mr. Meldrum feels fairly well today without shortness of breath or chest pain, though he notes that he has not been active much.  Inpatient Medications    Scheduled Meds:  [MAR Hold] amLODipine  10 mg Oral Daily   [MAR Hold] ascorbic acid  500 mg Oral BID   [START ON 12/26/2022] aspirin  81 mg Oral Pre-Cath   [MAR Hold] aspirin EC  81 mg Oral Daily   [MAR Hold] dapagliflozin propanediol  5 mg Oral Daily   [MAR Hold] doxycycline  100 mg Oral Q12H   [MAR Hold] furosemide  40 mg Intravenous BID   guaiFENesin-dextromethorphan  10 mL Oral Q6H   [MAR Hold] heparin injection (subcutaneous)  5,000 Units Subcutaneous Q8H   [MAR Hold] hydrALAZINE  25 mg Oral Q8H   [MAR Hold] insulin aspart  0-5 Units Subcutaneous QHS   [MAR Hold] insulin aspart  0-9 Units Subcutaneous TID WC   [MAR Hold] Ipratropium-Albuterol  1 puff Inhalation Q6H   [MAR Hold] irbesartan  300 mg Oral Daily   [MAR Hold] metoprolol succinate  100 mg Oral Daily   [MAR Hold] nirmatrelvir/ritonavir (renal dosing)  2 tablet Oral BID   [MAR Hold] sodium chloride flush  3 mL Intravenous Q12H   [MAR Hold] zinc sulfate  220 mg Oral Daily   Continuous Infusions:  sodium chloride     [START ON 12/26/2022] sodium chloride     PRN Meds: sodium chloride, [MAR Hold] acetaminophen, [MAR Hold] chlorpheniramine-HYDROcodone, fentaNYL, [MAR Hold] guaiFENesin-dextromethorphan, Heparin (Porcine) in NaCl, heparin sodium (porcine), hydrALAZINE, iohexol, midazolam, sodium chloride flush, verapamil   Vital Signs    Vitals:   12/25/22 1526 12/25/22 1527 12/25/22 1532 12/25/22 1537  BP: (!) 161/104 (!) 161/104 (!) 169/108 (!) 158/111  Pulse: 87 87 96 84  Resp: 20 20 (!) 22 17  Temp:      TempSrc:      SpO2: 98% 98% 99% 98%  Weight:      Height:        Intake/Output Summary (Last 24 hours) at 12/25/2022  1551 Last data filed at 12/25/2022 0900 Gross per 24 hour  Intake 240 ml  Output --  Net 240 ml      12/24/2022    3:33 PM 12/23/2022    2:15 PM 06/12/2019    8:39 AM  Last 3 Weights  Weight (lbs) 359 lb 9.6 oz 366 lb 355 lb 12.8 oz  Weight (kg) 163.113 kg 166.017 kg 161.39 kg      Telemetry    Normal sinus rhythm and sinus tachycardia - Personally Reviewed  ECG    No new tracing.  Physical Exam   GEN: No acute distress.  Wife is at the bedside. Neck: Difficult to assess JVP due to body habitus. Cardiac: RRR, no murmurs, rubs, or gallops.  Respiratory: Clear to auscultation bilaterally. GI: Obese but nontender MS: Trace.  Dubiel edema bilaterally. Neuro:  Nonfocal  Psych: Normal affect   Labs    High Sensitivity Troponin:   Recent Labs  Lab 12/23/22 1417 12/23/22 1632  TROPONINIHS 19* 23*     Chemistry Recent Labs  Lab 12/23/22 1418 12/23/22 2224 12/24/22 0245 12/25/22 0645  NA 139 138 138 137  K 3.9 3.2* 3.6 3.4*  CL 103 101 103 101  CO2 24 29 26 26   GLUCOSE 126* 106* 99 125*  BUN  19 19 18 15   CREATININE 1.59* 1.59* 1.58* 1.47*  CALCIUM 9.4 8.9 8.7* 9.0  MG  --  2.1 2.1  --   PROT 6.9  --  6.4* 8.0  ALBUMIN 3.8  --  3.5 4.5  AST 21  --  20 24  ALT 23  --  21 23  ALKPHOS 56  --  48 65  BILITOT 1.0  --  1.1 1.2  GFRNONAA 55* 55* 55* >60  ANIONGAP 12 8 9 10     Lipids  Recent Labs  Lab 12/24/22 0245  CHOL 118  TRIG 108  HDL 36*  LDLCALC 60  CHOLHDL 3.3    Hematology Recent Labs  Lab 12/23/22 1418 12/24/22 0245  WBC 7.3 7.6  RBC 5.06 4.55  HGB 14.9 13.5  HCT 46.3 41.3  MCV 91.5 90.8  MCH 29.4 29.7  MCHC 32.2 32.7  RDW 14.7 14.7  PLT 303 260   Thyroid No results for input(s): "TSH", "FREET4" in the last 168 hours.  BNP Recent Labs  Lab 12/23/22 1417  BNP 481.7*    DDimer  Recent Labs  Lab 12/23/22 1625 12/25/22 0645  DDIMER 0.78* 0.34     Radiology    CARDIAC CATHETERIZATION  Result Date: 12/25/2022 Conclusions: No  angiographically significant coronary artery disease.  Findings are consistent with nonischemic cardiomyopathy. Moderately elevated left heart, right heart, and pulmonary artery pressures. Normal Fick cardiac output/index. Recommendations: Continue diuresis. Escalate GDMT as tolerated. Primary prevention of coronary artery disease. Nelva Bush, MD [facility]  ECHOCARDIOGRAM COMPLETE  Result Date: 12/24/2022    ECHOCARDIOGRAM REPORT   Patient Name:   KASCH PASTERNACK Date of Exam: 12/24/2022 Medical Rec #:  [id number]     Height:       69.0 in Accession #:    [id number]    Weight:       366.0 lb Date of Birth:  [birth year]     BSA:          2.671 m Patient Age:    42 years      BP:           174/108 mmHg Patient Gender: M             HR:           98 bpm. Exam Location:  [facility] Procedure: 2D Echo, Cardiac Doppler, Color Doppler and Intracardiac            Opacification Agent Indications:     CHF  History:         Patient has no prior history of Echocardiogram examinations.                  CHF; Risk Factors:Hypertension, Diabetes, Dyslipidemia and                  Sleep Apnea.  Sonographer:     DBB Referring Phys:  [id number] Lucienne Minks Diagnosing Phys: Kathlyn Sacramento MD  Sonographer Comments: Patient is obese. IMPRESSIONS  1. Left ventricular ejection fraction, by estimation, is 35 to 40%. The left ventricle has moderately decreased function. The left ventricle demonstrates regional wall motion abnormalities (see scoring diagram/findings for description). There is moderate left ventricular hypertrophy. Left ventricular diastolic parameters are indeterminate. There is moderate hypokinesis of the left ventricular, basal-mid inferior wall.  2. Right ventricular systolic function is normal. The right ventricular size is normal. There is mildly elevated pulmonary artery systolic pressure.  3. Left atrial size was moderately dilated.  4. The mitral valve is normal in structure. No evidence of mitral valve regurgitation.  No evidence of mitral stenosis.  5. The aortic valve is normal in structure. Aortic valve regurgitation is not visualized. No aortic stenosis is present.  6. The inferior vena cava is normal in size with <50% respiratory variability, suggesting right atrial pressure of 8 mmHg. FINDINGS  Left Ventricle: Left ventricular ejection fraction, by estimation, is 35 to 40%. The left ventricle has moderately decreased function. The left ventricle demonstrates regional wall motion abnormalities. Moderate hypokinesis of the left ventricular, basal-mid inferior wall. Definity contrast agent was given IV to delineate the left ventricular endocardial borders. The left ventricular internal cavity size was normal in size. There is moderate left ventricular hypertrophy. Left ventricular diastolic parameters are indeterminate. Right Ventricle: The right ventricular size is normal. No increase in right ventricular wall thickness. Right ventricular systolic function is normal. There is mildly elevated pulmonary artery systolic pressure. The tricuspid regurgitant velocity is 2.57  m/s, and with an assumed right atrial pressure of 8 mmHg, the estimated right ventricular systolic pressure is 57.2 mmHg. Left Atrium: Left atrial size was moderately dilated. Right Atrium: Right atrial size was normal in size. Pericardium: There is no evidence of pericardial effusion. Mitral Valve: The mitral valve is normal in structure. No evidence of mitral valve regurgitation. No evidence of mitral valve stenosis. MV peak gradient, 4.0 mmHg. The mean mitral valve gradient is 2.0 mmHg. Tricuspid Valve: The tricuspid valve is normal in structure. Tricuspid valve regurgitation is trivial. No evidence of tricuspid stenosis. Aortic Valve: The aortic valve is normal in structure. Aortic valve regurgitation is not visualized. No aortic stenosis is present. Aortic valve mean gradient measures 3.0 mmHg. Aortic valve peak gradient measures 5.9 mmHg. Aortic valve  area, by VTI measures 2.87 cm. Pulmonic Valve: The pulmonic valve was normal in structure. Pulmonic valve regurgitation is trivial. No evidence of pulmonic stenosis. Aorta: The aortic root is normal in size and structure. Venous: The inferior vena cava is normal in size with less than 50% respiratory variability, suggesting right atrial pressure of 8 mmHg. IAS/Shunts: No atrial level shunt detected by color flow Doppler.  LEFT VENTRICLE PLAX 2D LVIDd:         4.80 cm LVIDs:         4.00 cm LV PW:         1.50 cm LV IVS:        1.40 cm LVOT diam:     2.30 cm LV SV:         71 LV SV Index:   26 LVOT Area:     4.15 cm  RIGHT VENTRICLE RV Basal diam:  3.15 cm RV Mid diam:    2.20 cm LEFT ATRIUM              Index        RIGHT ATRIUM           Index LA diam:        5.00 cm  1.87 cm/m   RA Area:     18.40 cm LA Vol (A2C):   126.0 ml 47.17 ml/m  RA Volume:   47.40 ml  17.75 ml/m LA Vol (A4C):   58.4 ml  21.86 ml/m LA Biplane Vol: 88.3 ml  33.06 ml/m  AORTIC VALVE                    PULMONIC VALVE AV Area (Vmax):    3.24  cm     PV Vmax:       1.11 m/s AV Area (Vmean):   3.26 cm     PV Peak grad:  4.9 mmHg AV Area (VTI):     2.87 cm AV Vmax:           121.00 cm/s AV Vmean:          88.400 cm/s AV VTI:            0.246 m AV Peak Grad:      5.9 mmHg AV Mean Grad:      3.0 mmHg LVOT Vmax:         94.50 cm/s LVOT Vmean:        69.400 cm/s LVOT VTI:          0.170 m LVOT/AV VTI ratio: 0.69  AORTA Ao Root diam: 3.70 cm Ao Asc diam:  2.80 cm MITRAL VALVE                TRICUSPID VALVE MV Area (PHT): 4.71 cm     TR Peak grad:   26.4 mmHg MV Area VTI:   2.93 cm     TR Vmax:        257.00 cm/s MV Peak grad:  4.0 mmHg MV Mean grad:  2.0 mmHg     SHUNTS MV Vmax:       1.00 m/s     Systemic VTI:  0.17 m MV Vmean:      59.4 cm/s    Systemic Diam: 2.30 cm MV Decel Time: 161 msec MV E velocity: 102.00 cm/s MV A velocity: 87.70 cm/s MV E/A ratio:  1.16 Kathlyn Sacramento MD Electronically signed by Kathlyn Sacramento MD Signature  Date/Time: 12/24/2022/11:57:18 AM    Final    CT Angio Chest Pulmonary Embolism (PE) W or WO Contrast  Result Date: 12/23/2022 CLINICAL DATA:  Short of breath for 1 month, productive cough for 2 weeks, COVID positive EXAM: CT ANGIOGRAPHY CHEST WITH CONTRAST TECHNIQUE: Multidetector CT imaging of the chest was performed using the standard protocol during bolus administration of intravenous contrast. Multiplanar CT image reconstructions and MIPs were obtained to evaluate the vascular anatomy. RADIATION DOSE REDUCTION: This exam was performed according to the departmental dose-optimization program which includes automated exposure control, adjustment of the mA and/or kV according to patient size and/or use of iterative reconstruction technique. CONTRAST:  21mL OMNIPAQUE IOHEXOL 350 MG/ML SOLN COMPARISON:  12/23/2022 FINDINGS: Cardiovascular: This is a technically adequate evaluation of the pulmonary vasculature. No filling defects or pulmonary emboli. Cardiomegaly without pericardial effusion. No evidence of thoracic aortic aneurysm or dissection. Mediastinum/Nodes: Borderline enlarged mediastinal and right hilar adenopathy, largest in the right paratracheal region measuring 13 mm in short axis. Thyroid, trachea, and esophagus are unremarkable. Lungs/Pleura: There are minimal scattered ground-glass opacities seen bilaterally, most pronounced within the lower lobes. There is associated bronchial wall thickening, greatest at the lung bases. No effusion or pneumothorax. Upper Abdomen: No acute abnormality. Musculoskeletal: No acute or destructive bony lesions. Reconstructed images demonstrate no additional findings. Review of the MIP images confirms the above findings. IMPRESSION: 1. No evidence of pulmonary embolus. 2. Basilar predominant bronchial wall thickening with scattered ground-glass airspace disease, most consistent with bronchopneumonia. Atypical viral etiologies such as COVID could be considered. 3. Mild  cardiomegaly. Electronically Signed   By: Randa Ngo M.D.   On: 12/23/2022 22:16    Cardiac Studies   See echo and cardiac catheterization above.  Patient Profile  42 y.o. male with history of hypertension, type 2 diabetes mellitus, obstructive sleep apnea, and morbid obesity, admitted with acute HFrEF.   Assessment & Plan    Acute HFrEF due to nonischemic cardiomyopathy: Mr. Deleeuw is improving from a symptom standpoint but is still volume overloaded by physical exam and moderately elevated left and right heart pressures noted on today's cardiac catheterization.  No significant CAD was identified, consistent with nonischemic cardiomyopathy. -Switch irbesartan to Entresto 97-103 mg twice daily. -Continue furosemide 40 mg IV twice daily. -Continue metoprolol succinate 100 mg daily. -Increase dapagliflozin to 10 mg daily, which is the study dose for treatment of HFrEF. -Consider adding spironolactone and/or long-acting nitrates (already on hydralazine) as blood pressure allows.  Uncontrolled hypertension: Blood pressure still quite elevated. -Transition irbesartan to Entresto. -Continue current doses of amlodipine, hydralazine, and metoprolol succinate for now, though I would favor optimizing evidence-based heart failure regimen at the expense of amlodipine if necessary.  Type 2 diabetes mellitus: -Per primary team with escalation of dapagliflozin as noted above.  COVID-19: -Per primary team  For questions or updates, please contact [place] Please consult [email or web address] for contact info under [facility].     Signed, Nelva Bush, MD  12/25/2022, 3:51 PM

## 2022-12-25 NOTE — Assessment & Plan Note (Signed)
Patient with exertional dyspnea, orthopnea and lower extremity edema.  BNP elevated at 481.  Echocardiogram with new diagnosis of EF of 35 to 40% with regional wall motion abnormalities.  Cardiology was consulted. -Going for right and left cardiac catheterization today -Continue with IV Lasix -Strict intake and output -Daily weight and BMP

## 2022-12-25 NOTE — Interval H&P Note (Signed)
History and Physical Interval Note:  12/25/2022 2:43 PM  Jordan Greer  has presented today for surgery, with the diagnosis of new onset systolic congestive heart failure.  The various methods of treatment have been discussed with the patient and family. After consideration of risks, benefits and other options for treatment, the patient has consented to  Procedure(s): RIGHT/LEFT HEART CATH AND CORONARY ANGIOGRAPHY (N/A) as a surgical intervention.  The patient's history has been reviewed, patient examined, no change in status, stable for surgery.  I have reviewed the patient's chart and labs.  Questions were answered to the patient's satisfaction.    Cath Lab Visit (complete for each Cath Lab visit)  Clinical Evaluation Leading to the Procedure:   ACS: No.  Non-ACS:    Anginal/Heart Failure Classification: NYHA class IV  Anti-ischemic medical therapy: No Therapy  Non-Invasive Test Results: No non-invasive testing performed LVEF 35-40% -> intermediate risk  Prior CABG: No previous CABG  [name]

## 2022-12-25 NOTE — Assessment & Plan Note (Signed)
Blood pressure mildly elevated.  On multiple home antihypertensives. -Continue home meds -Add scheduled hydralazine

## 2022-12-26 ENCOUNTER — Other Ambulatory Visit (HOSPITAL_COMMUNITY): Payer: Self-pay

## 2022-12-26 ENCOUNTER — Telehealth: Payer: Self-pay | Admitting: *Deleted

## 2022-12-26 ENCOUNTER — Encounter: Payer: Self-pay | Admitting: Internal Medicine

## 2022-12-26 DIAGNOSIS — I5021 Acute systolic (congestive) heart failure: Secondary | ICD-10-CM

## 2022-12-26 DIAGNOSIS — U071 COVID-19: Secondary | ICD-10-CM | POA: Diagnosis not present

## 2022-12-26 DIAGNOSIS — I509 Heart failure, unspecified: Secondary | ICD-10-CM | POA: Diagnosis not present

## 2022-12-26 DIAGNOSIS — N1831 Chronic kidney disease, stage 3a: Secondary | ICD-10-CM | POA: Diagnosis not present

## 2022-12-26 LAB — POCT I-STAT 7, (LYTES, BLD GAS, ICA,H+H)
Acid-Base Excess: 1 mmol/L (ref 0.0–2.0)
Bicarbonate: 25.6 mmol/L (ref 20.0–28.0)
Calcium, Ion: 1.21 mmol/L (ref 1.15–1.40)
HCT: 47 % (ref 39.0–52.0)
Hemoglobin: 16 g/dL (ref 13.0–17.0)
O2 Saturation: 94 %
Potassium: 3.8 mmol/L (ref 3.5–5.1)
Sodium: 138 mmol/L (ref 135–145)
TCO2: 27 mmol/L (ref 22–32)
pCO2 arterial: 38.4 mmHg (ref 32–48)
pH, Arterial: 7.431 (ref 7.35–7.45)
pO2, Arterial: 69 mmHg — ABNORMAL LOW (ref 83–108)

## 2022-12-26 LAB — COMPREHENSIVE METABOLIC PANEL
ALT: 19 U/L (ref 0–44)
AST: 19 U/L (ref 15–41)
Albumin: 3.8 g/dL (ref 3.5–5.0)
Alkaline Phosphatase: 55 U/L (ref 38–126)
Anion gap: 11 (ref 5–15)
BUN: 16 mg/dL (ref 6–20)
CO2: 24 mmol/L (ref 22–32)
Calcium: 9.2 mg/dL (ref 8.9–10.3)
Chloride: 101 mmol/L (ref 98–111)
Creatinine, Ser: 1.42 mg/dL — ABNORMAL HIGH (ref 0.61–1.24)
GFR, Estimated: >60 mL/min (ref >60)
Glucose, Bld: 112 mg/dL — ABNORMAL HIGH (ref 70–99)
Potassium: 3.3 mmol/L — ABNORMAL LOW (ref 3.5–5.1)
Sodium: 136 mmol/L (ref 135–145)
Total Bilirubin: 1 mg/dL (ref 0.3–1.2)
Total Protein: 7.2 g/dL (ref 6.5–8.1)

## 2022-12-26 LAB — D-DIMER, QUANTITATIVE: D-Dimer, Quant: 0.35 ug/mL-FEU (ref 0.00–0.50)

## 2022-12-26 LAB — GLUCOSE, CAPILLARY
Glucose-Capillary: 135 mg/dL — ABNORMAL HIGH (ref 70–99)
Glucose-Capillary: 161 mg/dL — ABNORMAL HIGH (ref 70–99)

## 2022-12-26 LAB — C-REACTIVE PROTEIN: CRP: 0.6 mg/dL (ref <1.0)

## 2022-12-26 MED ORDER — IPRATROPIUM-ALBUTEROL 20-100 MCG/ACT IN AERS
1.0000 | INHALATION_SPRAY | Freq: Four times a day (QID) | RESPIRATORY_TRACT | 0 refills | Status: AC
  Filled 2023-06-29: qty 4, 30d supply, fill #0

## 2022-12-26 MED ORDER — GUAIFENESIN-DM 100-10 MG/5ML PO SYRP: 10.0000 mL | ORAL_SOLUTION | ORAL | 0 refills | Status: DC | PRN

## 2022-12-26 MED ORDER — ROSUVASTATIN CALCIUM 20 MG PO TABS: 20.0000 mg | ORAL_TABLET | Freq: Every day | ORAL | Status: DC

## 2022-12-26 MED ORDER — SPIRONOLACTONE 25 MG PO TABS: 25.0000 mg | ORAL_TABLET | Freq: Every day | ORAL | 1 refills | Status: DC

## 2022-12-26 MED ORDER — TORSEMIDE 60 MG PO TABS: 60.0000 mg | ORAL_TABLET | Freq: Every day | ORAL | 1 refills | Status: DC

## 2022-12-26 MED ORDER — SPIRONOLACTONE 25 MG PO TABS
25.0000 mg | ORAL_TABLET | Freq: Every day | ORAL | Status: DC
  Administered 2022-12-26: 25 mg via ORAL
  Filled 2022-12-26: qty 1

## 2022-12-26 MED ORDER — ISOSORBIDE MONONITRATE ER 30 MG PO TB24: 30.0000 mg | ORAL_TABLET | Freq: Every day | ORAL | 0 refills | Status: DC

## 2022-12-26 MED ORDER — ASPIRIN 81 MG PO TBEC
81.0000 mg | DELAYED_RELEASE_TABLET | Freq: Every day | ORAL | 12 refills | Status: DC
  Filled 2023-06-29: qty 30, 30d supply, fill #0

## 2022-12-26 MED ORDER — DOXYCYCLINE HYCLATE 100 MG PO TABS: 100.0000 mg | ORAL_TABLET | Freq: Two times a day (BID) | ORAL | 0 refills | Status: AC

## 2022-12-26 MED ORDER — SACUBITRIL-VALSARTAN 97-103 MG PO TABS: 1.0000 | ORAL_TABLET | Freq: Two times a day (BID) | ORAL | 1 refills | Status: DC

## 2022-12-26 MED ORDER — ZINC SULFATE 220 (50 ZN) MG PO CAPS: 220.0000 mg | ORAL_CAPSULE | Freq: Every day | ORAL | 0 refills | Status: DC

## 2022-12-26 MED ORDER — DAPAGLIFLOZIN PROPANEDIOL 10 MG PO TABS: 10.0000 mg | ORAL_TABLET | Freq: Every day | ORAL | 2 refills | Status: DC

## 2022-12-26 MED ORDER — HYDRALAZINE HCL 25 MG PO TABS: 25.0000 mg | ORAL_TABLET | Freq: Three times a day (TID) | ORAL | 0 refills | Status: DC

## 2022-12-26 MED ORDER — CARVEDILOL 12.5 MG PO TABS: 12.5000 mg | ORAL_TABLET | Freq: Two times a day (BID) | ORAL | 1 refills | Status: DC

## 2022-12-26 MED ORDER — NIRMATRELVIR/RITONAVIR (PAXLOVID) TABLET (RENAL DOSING): 2.0000 | ORAL_TABLET | Freq: Two times a day (BID) | ORAL | 0 refills | Status: AC

## 2022-12-26 MED ORDER — POTASSIUM CHLORIDE CRYS ER 20 MEQ PO TBCR
40.0000 meq | EXTENDED_RELEASE_TABLET | Freq: Once | ORAL | Status: AC
  Administered 2022-12-26: 40 meq via ORAL
  Filled 2022-12-26: qty 2

## 2022-12-26 MED ORDER — ISOSORBIDE MONONITRATE ER 30 MG PO TB24
30.0000 mg | ORAL_TABLET | Freq: Every day | ORAL | Status: DC
  Administered 2022-12-26: 30 mg via ORAL
  Filled 2022-12-26: qty 1

## 2022-12-26 NOTE — Plan of Care (Signed)
Provided information on home medicines and radial site care and also his follow up care with the physician.

## 2022-12-26 NOTE — TOC CM/SW Note (Signed)
  Transition of Care [facility]) Screening Note   Patient Details  Name: Tanish Prien Date of Birth: Aug 22, 1981   Transition of Care [facility]) CM/SW Contact:    Candie Chroman, LCSW Phone Number: 12/26/2022, 9:17 AM    Transition of Care Department [facility]) has reviewed patient and no TOC needs have been identified at this time. We will continue to monitor patient advancement through interdisciplinary progression rounds. If new patient transition needs arise, please place a TOC consult.

## 2022-12-26 NOTE — Discharge Summary (Signed)
Physician Discharge Summary   Patient: Jordan Greer MRN: [id number] DOB: 01/18/1981  Admit date:     12/23/2022  Discharge date: 12/26/22  Discharge Physician: Lorella Nimrod   PCP: Wilhelmina Mcardle, MD   Recommendations at discharge:  Please obtain CBC and BMP within a week Follow-up with cardiology within a week Follow-up in heart failure clinic Follow-up for cardiac rehab  Discharge Diagnoses: Principal Problem:   Acute HFrEF (heart failure with reduced ejection fraction) ([place]) Active Problems:   Chronic kidney disease (CKD), stage III (moderate) ([place])   Hyperlipidemia   Hypertension associated with diabetes ([place])   Type 2 diabetes mellitus with hyperlipidemia ([place])   COVID-19   Obesity, Class III, BMI 40-49.9 (morbid obesity) ([place])   Acute congestive heart failure [facility])   Hospital Course: Taken from H&P   Jordan Greer is a 42 y.o. male who presents to the hospital with SOB. On Wed he went to his PCP because he was coughing up whitish sputum. 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 This morning he went to the urgent care center and had a COVID test completed which was positive. He was also told that his BP was high and that he should immediately go to the ER.  Patient was having exertional dyspnea for the past 1 month.  ED course.  Was found to have elevated blood pressure at 179/132.  Lower extremity edema.  Labs pertinent for BNP elevated at 481, troponin 19>23, procalcitonin negative, D-dimer at 0.78.  Unremarkable CBC and renal function seems to be around his baseline. CTA negative for PE.  Did show bibasilar predominant bronchial wall thickening with scattered groundglass airspace disease, most consistent with bronchopneumonia.  Atypical viral etiology such as COVID could be considered.  Mild cardiomegaly.  2/5: Vitals with mild tachycardia and blood pressure remained elevated at 143/95.  Patient was on multiple antihypertensives at home which were  restarted. Lipid panel without any significant abnormality.  LDL 60.  A1c of 6.  Renal functions seems to be stable and consistent with CKD stage III A.  Hypokalemia resolved. Echocardiogram with EF of 35 to 40% with regional wall motion abnormalities and hypokinesis of left ventricular, basal-mid inferior wall.  Dilated left atria.  Cardiology was consulted.  2/6: Blood pressure remained elevated.  Adding scheduled 25 mg of hydralazine.  Going for cardiac catheterization today on new diagnosis of HFrEF.  2/7: Blood pressure remained mildly elevated, although improved than before, at 151/111, cardiologist switched irbesartan with Entresto 97-103 will increase the dose of Farxiga. Cardiac catheterization yesterday consistent with nonischemic cardiomyopathy.  Labs with mild hypokalemia with potassium of 3.3.  Rest of the labs stable.  Lipoprotein a pending. Cardiology made changes to his medications, he will take Imdur and hydralazine together at this time and they are planning to switch to Bidil after insurance authorization.  Metoprolol was switched with carvedilol. He was given torsemide 60 mg daily and also started on spironolactone 25 mg daily to optimize heart failure medications.  Clinically appears euvolemic.  We discussed with patient about weight loss and having a close follow-up with cardiology for further recommendations. He was also given referral for cardiac rehab and heart failure clinic.  Patient will complete the current dose of Paxlovid, he will hold Crestor while taking Paxlovid and was instructed to resume after finishing the Paxlovid.  He will continue on current medications and need to have a close follow-up with his providers.  Assessment and Plan: * Acute  HFrEF (heart failure with reduced ejection fraction) (HCC) Patient with exertional dyspnea, orthopnea and lower extremity edema.  BNP elevated at 481.  Echocardiogram with new diagnosis of EF of 35 to 40% with regional wall  motion abnormalities.  Cardiology was consulted. -Going for right and left cardiac catheterization today -Continue with IV Lasix -Strict intake and output -Daily weight and BMP  Obesity, Class III, BMI 40-49.9 (morbid obesity) (HCC) Estimated body mass index is 54.05 kg/m as calculated from the following:   Height as of this encounter: 5\' 9"  (1.753 m).   Weight as of this encounter: 166 kg.   -This will complicate overall prognosis -Counseling was provided  COVID-19 Diagnosed yesterday at urgent care.  Chest imaging with bilateral groundglass opacities, procalcitonin negative.  Elevated D-dimer most likely secondary to COVID-19 as CTA was negative for PE.  No oxygen requirement -Start him on Paxlovid -Supportive care  Type 2 diabetes mellitus with hyperlipidemia (HCC) Seems well-controlled with A1c of 6. -SSI -Continue Farxiga  Hypertension associated with diabetes (HCC) Blood pressure mildly elevated.  On multiple home antihypertensives. -Continue home meds -Add scheduled hydralazine  Hyperlipidemia Lipid panel mostly within normal limit. -Holding home Lipitor while patient is on Paxlovid  Chronic kidney disease (CKD), stage III (moderate) (HCC) Renal function seems stable and consistent with CKD stage IIIa. -Monitor renal function -Avoid nephrotoxins   Consultants: Cardiology Procedures performed: Right and left cardiac catheterization Disposition: Home Diet recommendation:  Discharge Diet Orders (From admission, onward)     Start     Ordered   12/26/22 0000  Diet - low sodium heart healthy        12/26/22 1100           Cardiac and Carb modified diet DISCHARGE MEDICATION: Allergies as of 12/26/2022       Reactions   Fish Allergy Itching, Nausea And Vomiting        Medication List     STOP taking these medications    amoxicillin-clavulanate 875-125 MG tablet Commonly known as: AUGMENTIN   atorvastatin 20 MG tablet Commonly known as: LIPITOR    furosemide 40 MG tablet Commonly known as: LASIX   metoprolol succinate 100 MG 24 hr tablet Commonly known as: TOPROL-XL   NIFEdipine 60 MG 24 hr tablet Commonly known as: ADALAT CC   olmesartan 40 MG tablet Commonly known as: BENICAR       TAKE these medications    albuterol 108 (90 Base) MCG/ACT inhaler Commonly known as: VENTOLIN HFA Inhale 2 puffs into the lungs every 6 (six) hours as needed for shortness of breath or wheezing.   amLODipine 10 MG tablet Commonly known as: NORVASC Take 10 mg by mouth daily.   ascorbic acid 500 MG tablet Commonly known as: VITAMIN C Take 1 tablet (500 mg total) by mouth daily.   aspirin EC 81 MG tablet Take 1 tablet (81 mg total) by mouth daily. Swallow whole. Start taking on: December 27, 2022   carvedilol 12.5 MG tablet Commonly known as: COREG Take 1 tablet (12.5 mg total) by mouth 2 (two) times daily with a meal.   dapagliflozin propanediol 10 MG Tabs tablet Commonly known as: Farxiga Take 1 tablet (10 mg total) by mouth daily. Start taking on: December 27, 2022 What changed:  medication strength how much to take   doxycycline 100 MG tablet Commonly known as: VIBRA-TABS Take 1 tablet (100 mg total) by mouth every 12 (twelve) hours for 2 days.   glimepiride 4 MG tablet Commonly  known as: AMARYL Take 4 mg by mouth in the morning and at bedtime.   glipiZIDE 10 MG 24 hr tablet Commonly known as: GLUCOTROL XL Take 10 mg by mouth 2 (two) times daily.   guaiFENesin-dextromethorphan 100-10 MG/5ML syrup Commonly known as: ROBITUSSIN DM Take 10 mLs by mouth every 4 (four) hours as needed for cough.   hydrALAZINE 25 MG tablet Commonly known as: APRESOLINE Take 1 tablet (25 mg total) by mouth every 8 (eight) hours.   Ipratropium-Albuterol 20-100 MCG/ACT Aers respimat Commonly known as: COMBIVENT Inhale 1 puff into the lungs every 6 (six) hours.   isosorbide mononitrate 30 MG 24 hr tablet Commonly known as: IMDUR Take 1  tablet (30 mg total) by mouth daily.   metFORMIN 500 MG 24 hr tablet Commonly known as: GLUCOPHAGE-XR Take 1,000 mg by mouth 2 (two) times daily.   nirmatrelvir/ritonavir (renal dosing) 10 x 150 MG & 10 x 100MG  Tabs Commonly known as: PAXLOVID Take 2 tablets by mouth 2 (two) times daily for 5 days. Patient GFR is <60. Take nirmatrelvir (150 mg) one tablet twice daily for 5 days and ritonavir (100 mg) one tablet twice daily for 5 days.   Ozempic (2 MG/DOSE) 8 MG/3ML Sopn Generic drug: Semaglutide (2 MG/DOSE) Inject 2 mg into the skin once a week.   rosuvastatin 20 MG tablet Commonly known as: CRESTOR Take 1 tablet (20 mg total) by mouth at bedtime. Please restart after finishing Paxlovid What changed: additional instructions   sacubitril-valsartan 97-103 MG Commonly known as: ENTRESTO Take 1 tablet by mouth 2 (two) times daily.   spironolactone 25 MG tablet Commonly known as: ALDACTONE Take 1 tablet (25 mg total) by mouth daily.   Torsemide 60 MG Tabs Take 60 mg by mouth daily.   zinc sulfate 220 (50 Zn) MG capsule Take 1 capsule (220 mg total) by mouth daily. Start taking on: December 27, 2022        Follow-up Information     Wilhelmina Mcardle, MD. Schedule an appointment as soon as possible for a visit in 1 week(s).   Specialty: Internal Medicine        End, Harrell Gave, MD. Go in 1 week(s).   Specialty: Cardiology Why: Appointment on Thursday, 01/03/2023 at 11:20am. Please arrive 15 minutes early for this appointment. Contact information: [place] [address] [phone]                Discharge Exam: Danley Danker Weights   12/23/22 1415 12/24/22 1533  Weight: (!) 166 kg (!) 163.1 kg   General.  Morbidly obese gentleman, in no acute distress. Pulmonary.  Lungs clear bilaterally, normal respiratory effort. CV.  Regular rate and rhythm, no JVD, rub or murmur. Abdomen.  Soft, nontender, nondistended, BS positive. CNS.  Alert and  oriented .  No focal neurologic deficit. Extremities.  No edema, no cyanosis, pulses intact and symmetrical. Psychiatry.  Judgment and insight appears normal.   Condition at discharge: stable  The results of significant diagnostics from this hospitalization (including imaging, microbiology, ancillary and laboratory) are listed below for reference.   Imaging Studies: CARDIAC CATHETERIZATION  Result Date: 12/25/2022 Conclusions: No angiographically significant coronary artery disease.  Findings are consistent with nonischemic cardiomyopathy. Moderately elevated left heart, right heart, and pulmonary artery pressures. Normal Fick cardiac output/index. Recommendations: Continue diuresis. Escalate GDMT as tolerated. Primary prevention of coronary artery disease. Nelva Bush, MD [facility]  ECHOCARDIOGRAM COMPLETE  Result Date: 12/24/2022    ECHOCARDIOGRAM REPORT  Patient Name:   Cleta AlbertsRONNIE Lenhardt Date of Exam: 12/24/2022 Medical Rec #:  [id number]     Height:       69.0 in Accession #:    [id number]    Weight:       366.0 lb Date of Birth:  01/04/1981     BSA:          2.671 m Patient Age:    41 years      BP:           174/108 mmHg Patient Gender: M             HR:           98 bpm. Exam Location:  [facility] Procedure: 2D Echo, Cardiac Doppler, Color Doppler and Intracardiac            Opacification Agent Indications:     CHF  History:         Patient has no prior history of Echocardiogram examinations.                  CHF; Risk Factors:Hypertension, Diabetes, Dyslipidemia and                  Sleep Apnea.  Sonographer:     DBB Referring Phys:  [id number] Baron HamperZACKERY SIRA Diagnosing Phys: Lorine BearsMuhammad Arida MD  Sonographer Comments: Patient is obese. IMPRESSIONS  1. Left ventricular ejection fraction, by estimation, is 35 to 40%. The left ventricle has moderately decreased function. The left ventricle demonstrates regional wall motion abnormalities (see scoring diagram/findings for description). There is moderate left  ventricular hypertrophy. Left ventricular diastolic parameters are indeterminate. There is moderate hypokinesis of the left ventricular, basal-mid inferior wall.  2. Right ventricular systolic function is normal. The right ventricular size is normal. There is mildly elevated pulmonary artery systolic pressure.  3. Left atrial size was moderately dilated.  4. The mitral valve is normal in structure. No evidence of mitral valve regurgitation. No evidence of mitral stenosis.  5. The aortic valve is normal in structure. Aortic valve regurgitation is not visualized. No aortic stenosis is present.  6. The inferior vena cava is normal in size with <50% respiratory variability, suggesting right atrial pressure of 8 mmHg. FINDINGS  Left Ventricle: Left ventricular ejection fraction, by estimation, is 35 to 40%. The left ventricle has moderately decreased function. The left ventricle demonstrates regional wall motion abnormalities. Moderate hypokinesis of the left ventricular, basal-mid inferior wall. Definity contrast agent was given IV to delineate the left ventricular endocardial borders. The left ventricular internal cavity size was normal in size. There is moderate left ventricular hypertrophy. Left ventricular diastolic parameters are indeterminate. Right Ventricle: The right ventricular size is normal. No increase in right ventricular wall thickness. Right ventricular systolic function is normal. There is mildly elevated pulmonary artery systolic pressure. The tricuspid regurgitant velocity is 2.57  m/s, and with an assumed right atrial pressure of 8 mmHg, the estimated right ventricular systolic pressure is 34.4 mmHg. Left Atrium: Left atrial size was moderately dilated. Right Atrium: Right atrial size was normal in size. Pericardium: There is no evidence of pericardial effusion. Mitral Valve: The mitral valve is normal in structure. No evidence of mitral valve regurgitation. No evidence of mitral valve stenosis. MV  peak gradient, 4.0 mmHg. The mean mitral valve gradient is 2.0 mmHg. Tricuspid Valve: The tricuspid valve is normal in structure. Tricuspid valve regurgitation is trivial. No evidence of tricuspid stenosis. Aortic Valve: The aortic valve is normal in  structure. Aortic valve regurgitation is not visualized. No aortic stenosis is present. Aortic valve mean gradient measures 3.0 mmHg. Aortic valve peak gradient measures 5.9 mmHg. Aortic valve area, by VTI measures 2.87 cm. Pulmonic Valve: The pulmonic valve was normal in structure. Pulmonic valve regurgitation is trivial. No evidence of pulmonic stenosis. Aorta: The aortic root is normal in size and structure. Venous: The inferior vena cava is normal in size with less than 50% respiratory variability, suggesting right atrial pressure of 8 mmHg. IAS/Shunts: No atrial level shunt detected by color flow Doppler.  LEFT VENTRICLE PLAX 2D LVIDd:         4.80 cm LVIDs:         4.00 cm LV PW:         1.50 cm LV IVS:        1.40 cm LVOT diam:     2.30 cm LV SV:         71 LV SV Index:   26 LVOT Area:     4.15 cm  RIGHT VENTRICLE RV Basal diam:  3.15 cm RV Mid diam:    2.20 cm LEFT ATRIUM              Index        RIGHT ATRIUM           Index LA diam:        5.00 cm  1.87 cm/m   RA Area:     18.40 cm LA Vol (A2C):   126.0 ml 47.17 ml/m  RA Volume:   47.40 ml  17.75 ml/m LA Vol (A4C):   58.4 ml  21.86 ml/m LA Biplane Vol: 88.3 ml  33.06 ml/m  AORTIC VALVE                    PULMONIC VALVE AV Area (Vmax):    3.24 cm     PV Vmax:       1.11 m/s AV Area (Vmean):   3.26 cm     PV Peak grad:  4.9 mmHg AV Area (VTI):     2.87 cm AV Vmax:           121.00 cm/s AV Vmean:          88.400 cm/s AV VTI:            0.246 m AV Peak Grad:      5.9 mmHg AV Mean Grad:      3.0 mmHg LVOT Vmax:         94.50 cm/s LVOT Vmean:        69.400 cm/s LVOT VTI:          0.170 m LVOT/AV VTI ratio: 0.69  AORTA Ao Root diam: 3.70 cm Ao Asc diam:  2.80 cm MITRAL VALVE                TRICUSPID VALVE  MV Area (PHT): 4.71 cm     TR Peak grad:   26.4 mmHg MV Area VTI:   2.93 cm     TR Vmax:        257.00 cm/s MV Peak grad:  4.0 mmHg MV Mean grad:  2.0 mmHg     SHUNTS MV Vmax:       1.00 m/s     Systemic VTI:  0.17 m MV Vmean:      59.4 cm/s    Systemic Diam: 2.30 cm MV Decel Time: 161 msec MV E velocity: 102.00 cm/s MV  A velocity: 87.70 cm/s MV E/A ratio:  1.16 Lorine Bears MD Electronically signed by Lorine Bears MD Signature Date/Time: 12/24/2022/11:57:18 AM    Final    CT Angio Chest Pulmonary Embolism (PE) W or WO Contrast  Result Date: 12/23/2022 CLINICAL DATA:  Short of breath for 1 month, productive cough for 2 weeks, COVID positive EXAM: CT ANGIOGRAPHY CHEST WITH CONTRAST TECHNIQUE: Multidetector CT imaging of the chest was performed using the standard protocol during bolus administration of intravenous contrast. Multiplanar CT image reconstructions and MIPs were obtained to evaluate the vascular anatomy. RADIATION DOSE REDUCTION: This exam was performed according to the departmental dose-optimization program which includes automated exposure control, adjustment of the mA and/or kV according to patient size and/or use of iterative reconstruction technique. CONTRAST:  47mL OMNIPAQUE IOHEXOL 350 MG/ML SOLN COMPARISON:  12/23/2022 FINDINGS: Cardiovascular: This is a technically adequate evaluation of the pulmonary vasculature. No filling defects or pulmonary emboli. Cardiomegaly without pericardial effusion. No evidence of thoracic aortic aneurysm or dissection. Mediastinum/Nodes: Borderline enlarged mediastinal and right hilar adenopathy, largest in the right paratracheal region measuring 13 mm in short axis. Thyroid, trachea, and esophagus are unremarkable. Lungs/Pleura: There are minimal scattered ground-glass opacities seen bilaterally, most pronounced within the lower lobes. There is associated bronchial wall thickening, greatest at the lung bases. No effusion or pneumothorax. Upper Abdomen: No  acute abnormality. Musculoskeletal: No acute or destructive bony lesions. Reconstructed images demonstrate no additional findings. Review of the MIP images confirms the above findings. IMPRESSION: 1. No evidence of pulmonary embolus. 2. Basilar predominant bronchial wall thickening with scattered ground-glass airspace disease, most consistent with bronchopneumonia. Atypical viral etiologies such as COVID could be considered. 3. Mild cardiomegaly. Electronically Signed   By: Sharlet Salina M.D.   On: 12/23/2022 22:16   DG Chest 2 View  Result Date: 12/23/2022 CLINICAL DATA:  Shortness of breath with exertion for 1 month. COVID-19 positive. EXAM: CHEST - 2 VIEW COMPARISON:  03/16/2019 FINDINGS: Lateral view is motion degraded. The frontal view is degraded by patient body habitus. Midline trachea. Mild cardiomegaly, accentuated by low lung volumes. No pleural effusion or pneumothorax. Diffuse interstitial prominence and indistinctness. IMPRESSION: Mild cardiomegaly with diffuse interstitial prominence. Given absence of pleural effusions, viral or mycoplasma pneumonia favored over pulmonary edema. Not a typical appearance of COVID-19 pneumonia, which cannot be excluded. Electronically Signed   By: Jeronimo Greaves M.D.   On: 12/23/2022 14:49    Microbiology: Results for orders placed or performed during the hospital encounter of 02/22/19  Novel Coronavirus, NAA (hospital order; send-out to ref lab)     Status: Abnormal   Collection Time: 02/22/19 11:47 AM  Result Value Ref Range Status   SARS-CoV-2, NAA DETECTED (A) NOT DETECTED Final    Comment: Positive (Detected) results are indicative of active infection with SARS CoV 2. A positive result does not rule out bacterial infection or coinfection with other viruses. Detection of SARS CoV 2 viral RNA may not indicate that SARS CoV 2 is the causative agent for clinical symptoms. Positive and negative predictive values of testing are highly dependent on  prevalence. False positive test results are more likely when prevalence is moderate to low. CRITICAL RESULT CALLED TO, READ BACK BY AND VERIFIED WITH: T RICE RN 02/25/19 0056 JDW (NOTE) The expected result is Negative (Not Detected). The SARS CoV 2 test is intended for the presumptive qualitative  detection of nucleic acid from SARS CoV 2 in upper and lower  respiratory specimens. Testing methodology is real  time RT PCR. Test results must be correlated with clinical presentation and  evaluated in the context of other laboratory and epidemiologic data.  Test performance can be affected because the epidemiology and  clinical  spectrum of infection caused by SARS CoV 2 is not fully  known. For example, the optimum types of specimens to collect and  when during the course of infection these specimens are most likely  to contain detectable viral RNA may not be known. This test has not been Food and Drug Administration (FDA) cleared or  approved and has been authorized by FDA under an Emergency Use  Authorization (EUA). The test is only authorized for the duration of  the declaration that circumstances exist justifying the authorization  of emergency use of in vitro diagnostic tests for detection and or  diagnosis of SARS CoV 2 under Section 564(b)(1) of the Act, 21 U.S.C.  section [phone] 3(b)(1), unless the authorization is terminated or  revoked sooner. [facility] is certified under the  Clinical Laboratory Improvement Amendments of 1988 (CLIA), 42 U.S.C.  section [phone], to perform high complexity tests. Performed at [facility] CLIA [id number] [address], [place], [place] [address] Laboratory Director: Turner Daniels, MD Fact Sheet for Healthcare Providers  https://pope.com/ Fact Sheet for Patients  [email or web address] Performed at [facility], [address], [place],  [place] [address]    Coronavirus Source NASOPHARYNGEAL  Final    Comment: Performed at [facility], [address] [place], [place] [address]  Respiratory Panel by PCR     Status: None   Collection Time: 02/22/19 11:47 AM  Result Value Ref Range Status   Adenovirus NOT DETECTED NOT DETECTED Final   Coronavirus 229E NOT DETECTED NOT DETECTED Final    Comment: (NOTE) The Coronavirus on the Respiratory Panel, DOES NOT test for the novel  Coronavirus (2019 nCoV)    Coronavirus HKU1 NOT DETECTED NOT DETECTED Final   Coronavirus NL63 NOT DETECTED NOT DETECTED Final   Coronavirus OC43 NOT DETECTED NOT DETECTED Final   Metapneumovirus NOT DETECTED NOT DETECTED Final   Rhinovirus / Enterovirus NOT DETECTED NOT DETECTED Final   Influenza A NOT DETECTED NOT DETECTED Final   Influenza B NOT DETECTED NOT DETECTED Final   Parainfluenza Virus 1 NOT DETECTED NOT DETECTED Final   Parainfluenza Virus 2 NOT DETECTED NOT DETECTED Final   Parainfluenza Virus 3 NOT DETECTED NOT DETECTED Final   Parainfluenza Virus 4 NOT DETECTED NOT DETECTED Final   Respiratory Syncytial Virus NOT DETECTED NOT DETECTED Final   Bordetella pertussis NOT DETECTED NOT DETECTED Final   Chlamydophila pneumoniae NOT DETECTED NOT DETECTED Final   Mycoplasma pneumoniae NOT DETECTED NOT DETECTED Final    Comment: Performed at [facility], [address], [place], [place] [address]  MRSA PCR Screening     Status: None   Collection Time: 02/24/19  3:28 PM   Specimen: Nasopharyngeal  Result Value Ref Range Status   MRSA by PCR NEGATIVE NEGATIVE Final    Comment:        The GeneXpert MRSA Assay (FDA approved for NASAL specimens only), is one component of a comprehensive MRSA colonization surveillance program. It is not intended to diagnose MRSA infection nor to guide or monitor treatment for MRSA infections. Performed at [facility], [address] [place], [place] [address]     Labs: CBC: Recent  Labs  Lab 12/23/22 1418 12/24/22 0245 12/25/22  1501 12/25/22 1508  WBC 7.3 7.6  --   --   NEUTROABS 3.0  --   --   --   HGB 14.9 13.5 16.0 16.0  HCT 46.3 41.3 47.0 47.0  MCV 91.5 90.8  --   --   PLT 303 260  --   --    Basic Metabolic Panel: Recent Labs  Lab 12/23/22 1418 12/23/22 2224 12/24/22 0245 12/25/22 0645 12/25/22 1501 12/25/22 1508 12/26/22 0338  NA 139 138 138 137 141 138 136  K 3.9 3.2* 3.6 3.4* 3.7 3.8 3.3*  CL 103 101 103 101  --   --  101  CO2 24 29 26 26   --   --  24  GLUCOSE 126* 106* 99 125*  --   --  112*  BUN 19 19 18 15   --   --  16  CREATININE 1.59* 1.59* 1.58* 1.47*  --   --  1.42*  CALCIUM 9.4 8.9 8.7* 9.0  --   --  9.2  MG  --  2.1 2.1  --   --   --   --   PHOS  --   --  4.3  --   --   --   --    Liver Function Tests: Recent Labs  Lab 12/23/22 1418 12/24/22 0245 12/25/22 0645 12/26/22 0338  AST 21 20 24 19   ALT 23 21 23 19   ALKPHOS 56 48 65 55  BILITOT 1.0 1.1 1.2 1.0  PROT 6.9 6.4* 8.0 7.2  ALBUMIN 3.8 3.5 4.5 3.8   CBG: Recent Labs  Lab 12/25/22 0839 12/25/22 1211 12/25/22 1648 12/25/22 2349 12/26/22 0804  GLUCAP 143* 133* 92 152* 135*    Discharge time spent: greater than 30 minutes.  This record has been created using Systems analyst. Errors have been sought and corrected,but may not always be located. Such creation errors do not reflect on the standard of care.   Signed: Lorella Nimrod, MD [facility] 12/26/2022

## 2022-12-26 NOTE — TOC Benefit Eligibility Note (Signed)
Patient Teacher, English as a foreign language completed.    The patient is currently admitted and upon discharge could be taking Entresto 97-103 mg.  The current 30 day co-pay is $327.82.   The patient is currently admitted and upon discharge could be taking Farxiga 10 mg.  The current 30 day co-pay is $34.35.   The patient is currently admitted and upon discharge could be taking isosorbide-hydralyzine (Bidil) 20-37.5 mg.  The current 30 day co-pay is $7.50.   The patient is insured through [place], [place] Patient [place] Patient Advocate Team Direct Number: [phone]  Fax: [phone]

## 2022-12-26 NOTE — Telephone Encounter (Signed)
-----   Message from [place], PA-C sent at 12/26/2022  9:30 AM EST ----- Regarding: hosp follow-up Pt needs 1 week follow-up with heart failure team

## 2022-12-26 NOTE — Telephone Encounter (Signed)
Reviewed the patient's chart to place a referral to the CHF clinic.  However, Dr. Reesa Chew already placed this order at hospital discharge today.   Forwarding to CHF team

## 2022-12-26 NOTE — Progress Notes (Signed)
Rounding Note    Patient Name: Jordan Greer Date of Encounter: 12/26/2022  [facility] Cardiologist: New  Subjective   Feels significant improvement in symptoms since yesterday Walking around the room without difficulty  No orthopnea or PND overnight.   Inpatient Medications    Scheduled Meds:  amLODipine  10 mg Oral Daily   ascorbic acid  500 mg Oral BID   aspirin EC  81 mg Oral Daily   dapagliflozin propanediol  10 mg Oral Daily   doxycycline  100 mg Oral Q12H   enoxaparin (LOVENOX) injection  0.5 mg/kg Subcutaneous Q24H   furosemide  40 mg Intravenous BID   hydrALAZINE  25 mg Oral Q8H   insulin aspart  0-5 Units Subcutaneous QHS   insulin aspart  0-9 Units Subcutaneous TID WC   Ipratropium-Albuterol  1 puff Inhalation Q6H   isosorbide mononitrate  30 mg Oral Daily   metoprolol succinate  100 mg Oral Daily   nirmatrelvir/ritonavir (renal dosing)  2 tablet Oral BID   sacubitril-valsartan  1 tablet Oral BID   sodium chloride flush  3 mL Intravenous Q12H   sodium chloride flush  3 mL Intravenous Q12H   spironolactone  25 mg Oral Daily   zinc sulfate  220 mg Oral Daily   Continuous Infusions:  sodium chloride     PRN Meds: sodium chloride, acetaminophen, acetaminophen, chlorpheniramine-HYDROcodone, guaiFENesin-dextromethorphan, ondansetron (ZOFRAN) IV, sodium chloride flush   Vital Signs    Vitals:   12/25/22 2047 12/25/22 2350 12/26/22 0402 12/26/22 0808  BP: (!) 147/102 (!) 158/87 (!) 145/100 (!) 151/111  Pulse: 86 76 74 79  Resp: 20 20 18 16   Temp: 98.1 F (36.7 C) 97.9 F (36.6 C) 97.6 F (36.4 C) 98.1 F (36.7 C)  TempSrc:  Oral    SpO2: 97% 97% 97% 98%  Weight:      Height:        Intake/Output Summary (Last 24 hours) at 12/26/2022 0930 Last data filed at 12/25/2022 2200 Gross per 24 hour  Intake --  Output 1050 ml  Net -1050 ml       12/24/2022    3:33 PM 12/23/2022    2:15 PM 06/12/2019    8:39 AM  Last 3 Weights  Weight (lbs) 359  lb 9.6 oz 366 lb 355 lb 12.8 oz  Weight (kg) 163.113 kg 166.017 kg 161.39 kg      Telemetry    Normal sinus rhythm and sinus tachycardia - Personally Reviewed  ECG    No new tracing.  Physical Exam   GEN: Overweight WM laying comfortably in bed.  Neck: Difficult to assess JVP due to body habitus. Cardiac: RRR; no murmurs.  Respiratory: Clear to auscultation bilaterally. GI: Obese but nontender MS: Trace.  No peripheral edema.  Neuro:  Nonfocal  Psych: Normal affect   Labs    High Sensitivity Troponin:   Recent Labs  Lab 12/23/22 1417 12/23/22 1632  TROPONINIHS 19* 23*      Chemistry Recent Labs  Lab 12/23/22 2224 12/24/22 0245 12/25/22 0645 12/25/22 1501 12/25/22 1508 12/26/22 0338  NA 138 138 137 141 138 136  K 3.2* 3.6 3.4* 3.7 3.8 3.3*  CL 101 103 101  --   --  101  CO2 29 26 26   --   --  24  GLUCOSE 106* 99 125*  --   --  112*  BUN 19 18 15   --   --  16  CREATININE 1.59* 1.58* 1.47*  --   --  1.42*  CALCIUM 8.9 8.7* 9.0  --   --  9.2  MG 2.1 2.1  --   --   --   --   PROT  --  6.4* 8.0  --   --  7.2  ALBUMIN  --  3.5 4.5  --   --  3.8  AST  --  20 24  --   --  19  ALT  --  21 23  --   --  19  ALKPHOS  --  48 65  --   --  55  BILITOT  --  1.1 1.2  --   --  1.0  GFRNONAA 55* 55* >60  --   --  >60  ANIONGAP 8 9 10   --   --  11     Lipids  Recent Labs  Lab 12/24/22 0245  CHOL 118  TRIG 108  HDL 36*  LDLCALC 60  CHOLHDL 3.3     Hematology Recent Labs  Lab 12/23/22 1418 12/24/22 0245 12/25/22 1501 12/25/22 1508  WBC 7.3 7.6  --   --   RBC 5.06 4.55  --   --   HGB 14.9 13.5 16.0 16.0  HCT 46.3 41.3 47.0 47.0  MCV 91.5 90.8  --   --   MCH 29.4 29.7  --   --   MCHC 32.2 32.7  --   --   RDW 14.7 14.7  --   --   PLT 303 260  --   --     Thyroid No results for input(s): "TSH", "FREET4" in the last 168 hours.  BNP Recent Labs  Lab 12/23/22 1417  BNP 481.7*     DDimer  Recent Labs  Lab 12/23/22 1625 12/25/22 0645  12/26/22 0338  DDIMER 0.78* 0.34 0.35      Radiology    CARDIAC CATHETERIZATION  Result Date: 12/25/2022 Conclusions: No angiographically significant coronary artery disease.  Findings are consistent with nonischemic cardiomyopathy. Moderately elevated left heart, right heart, and pulmonary artery pressures. Normal Fick cardiac output/index. Recommendations: Continue diuresis. Escalate GDMT as tolerated. Primary prevention of coronary artery disease. Nelva Bush, MD [facility]  ECHOCARDIOGRAM COMPLETE  Result Date: 12/24/2022    ECHOCARDIOGRAM REPORT   Patient Name:   Jordan Greer Date of Exam: 12/24/2022 Medical Rec #:  [id number]     Height:       69.0 in Accession #:    [id number]    Weight:       366.0 lb Date of Birth:  Sep 21, 1981     BSA:          2.671 m Patient Age:    42 years      BP:           174/108 mmHg Patient Gender: M             HR:           98 bpm. Exam Location:  [facility] Procedure: 2D Echo, Cardiac Doppler, Color Doppler and Intracardiac            Opacification Agent Indications:     CHF  History:         Patient has no prior history of Echocardiogram examinations.                  CHF; Risk Factors:Hypertension, Diabetes, Dyslipidemia and                  Sleep Apnea.  Sonographer:  DBB Referring Phys:  [id number] Alveda Reasons SIRA Diagnosing Phys: Lorine Bears MD  Sonographer Comments: Patient is obese. IMPRESSIONS  1. Left ventricular ejection fraction, by estimation, is 35 to 40%. The left ventricle has moderately decreased function. The left ventricle demonstrates regional wall motion abnormalities (see scoring diagram/findings for description). There is moderate left ventricular hypertrophy. Left ventricular diastolic parameters are indeterminate. There is moderate hypokinesis of the left ventricular, basal-mid inferior wall.  2. Right ventricular systolic function is normal. The right ventricular size is normal. There is mildly elevated pulmonary artery systolic pressure.  3.  Left atrial size was moderately dilated.  4. The mitral valve is normal in structure. No evidence of mitral valve regurgitation. No evidence of mitral stenosis.  5. The aortic valve is normal in structure. Aortic valve regurgitation is not visualized. No aortic stenosis is present.  6. The inferior vena cava is normal in size with <50% respiratory variability, suggesting right atrial pressure of 8 mmHg. FINDINGS  Left Ventricle: Left ventricular ejection fraction, by estimation, is 35 to 40%. The left ventricle has moderately decreased function. The left ventricle demonstrates regional wall motion abnormalities. Moderate hypokinesis of the left ventricular, basal-mid inferior wall. Definity contrast agent was given IV to delineate the left ventricular endocardial borders. The left ventricular internal cavity size was normal in size. There is moderate left ventricular hypertrophy. Left ventricular diastolic parameters are indeterminate. Right Ventricle: The right ventricular size is normal. No increase in right ventricular wall thickness. Right ventricular systolic function is normal. There is mildly elevated pulmonary artery systolic pressure. The tricuspid regurgitant velocity is 2.57  m/s, and with an assumed right atrial pressure of 8 mmHg, the estimated right ventricular systolic pressure is 34.4 mmHg. Left Atrium: Left atrial size was moderately dilated. Right Atrium: Right atrial size was normal in size. Pericardium: There is no evidence of pericardial effusion. Mitral Valve: The mitral valve is normal in structure. No evidence of mitral valve regurgitation. No evidence of mitral valve stenosis. MV peak gradient, 4.0 mmHg. The mean mitral valve gradient is 2.0 mmHg. Tricuspid Valve: The tricuspid valve is normal in structure. Tricuspid valve regurgitation is trivial. No evidence of tricuspid stenosis. Aortic Valve: The aortic valve is normal in structure. Aortic valve regurgitation is not visualized. No aortic  stenosis is present. Aortic valve mean gradient measures 3.0 mmHg. Aortic valve peak gradient measures 5.9 mmHg. Aortic valve area, by VTI measures 2.87 cm. Pulmonic Valve: The pulmonic valve was normal in structure. Pulmonic valve regurgitation is trivial. No evidence of pulmonic stenosis. Aorta: The aortic root is normal in size and structure. Venous: The inferior vena cava is normal in size with less than 50% respiratory variability, suggesting right atrial pressure of 8 mmHg. IAS/Shunts: No atrial level shunt detected by color flow Doppler.  LEFT VENTRICLE PLAX 2D LVIDd:         4.80 cm LVIDs:         4.00 cm LV PW:         1.50 cm LV IVS:        1.40 cm LVOT diam:     2.30 cm LV SV:         71 LV SV Index:   26 LVOT Area:     4.15 cm  RIGHT VENTRICLE RV Basal diam:  3.15 cm RV Mid diam:    2.20 cm LEFT ATRIUM              Index  RIGHT ATRIUM           Index LA diam:        5.00 cm  1.87 cm/m   RA Area:     18.40 cm LA Vol (A2C):   126.0 ml 47.17 ml/m  RA Volume:   47.40 ml  17.75 ml/m LA Vol (A4C):   58.4 ml  21.86 ml/m LA Biplane Vol: 88.3 ml  33.06 ml/m  AORTIC VALVE                    PULMONIC VALVE AV Area (Vmax):    3.24 cm     PV Vmax:       1.11 m/s AV Area (Vmean):   3.26 cm     PV Peak grad:  4.9 mmHg AV Area (VTI):     2.87 cm AV Vmax:           121.00 cm/s AV Vmean:          88.400 cm/s AV VTI:            0.246 m AV Peak Grad:      5.9 mmHg AV Mean Grad:      3.0 mmHg LVOT Vmax:         94.50 cm/s LVOT Vmean:        69.400 cm/s LVOT VTI:          0.170 m LVOT/AV VTI ratio: 0.69  AORTA Ao Root diam: 3.70 cm Ao Asc diam:  2.80 cm MITRAL VALVE                TRICUSPID VALVE MV Area (PHT): 4.71 cm     TR Peak grad:   26.4 mmHg MV Area VTI:   2.93 cm     TR Vmax:        257.00 cm/s MV Peak grad:  4.0 mmHg MV Mean grad:  2.0 mmHg     SHUNTS MV Vmax:       1.00 m/s     Systemic VTI:  0.17 m MV Vmean:      59.4 cm/s    Systemic Diam: 2.30 cm MV Decel Time: 161 msec MV E velocity: 102.00  cm/s MV A velocity: 87.70 cm/s MV E/A ratio:  1.16 Kathlyn Sacramento MD Electronically signed by Kathlyn Sacramento MD Signature Date/Time: 12/24/2022/11:57:18 AM    Final     Cardiac Studies   See echo and cardiac catheterization above.  Patient Profile     42 y.o. male with history of hypertension, type 2 diabetes mellitus, obstructive sleep apnea, and morbid obesity, admitted with acute HFrEF.   Assessment & Plan    Acute HFrEF due to nonischemic cardiomyopathy: From a symptomatic standpoint doing very well; reports good diuresis overnight with IV lasix 40mg  BID. LHC yesterday w/ elevated LVEDP. Otherwise, asymptomatic today. Will plan for discharge home with 1 week follow up.  -Entresto 97-103 mg twice daily. -torsemide 60mg  daily -Coreg 12.5mg  BID -dapaglifozin 10mg  daily -Spironolactone 25mg  daily  -Pending insurance will try to switch to bidil 1 tab TID or continue hydralazine 25mg  TID + imdur 30mg  daily.   Uncontrolled hypertension: Blood pressure still quite elevated. -see above.  -Plan to come off amlodipine outpatient; remains hypertensive today.  Type 2 diabetes mellitus: -Per primary team with escalation of dapagliflozin as noted above.  COVID-19: -Per primary team   HF MEDS AT DISCHARGE:  Entresto 97/103 BID Spironolactone 25mg  daily Coreg 12.5mg  BID Farxiga 10mg  daily  Torsemide 60mg   daily  BIDIL 1 tab TID or (hydralazine 25mg  TID + Imdur 30)  For questions or updates, please contact [place] Please consult [email or web address] for contact info under [facility].     Signed, Hebert Soho, DO  12/26/2022, 9:30 AM

## 2022-12-27 LAB — LIPOPROTEIN A (LPA): Lipoprotein (a): 46.2 nmol/L — ABNORMAL HIGH (ref <75.0)

## 2023-01-02 ENCOUNTER — Encounter: Payer: Federal, State, Local not specified - PPO | Admitting: Family

## 2023-01-03 ENCOUNTER — Ambulatory Visit: Payer: Federal, State, Local not specified - PPO | Admitting: Internal Medicine

## 2023-01-07 ENCOUNTER — Other Ambulatory Visit
Admission: RE | Admit: 2023-01-07 | Discharge: 2023-01-07 | Disposition: A | Discharge status: Home / Self Care | Payer: Federal, State, Local not specified - PPO | Source: Ambulatory Visit | Attending: Medical | Admitting: Medical

## 2023-01-07 ENCOUNTER — Encounter: Payer: Self-pay | Admitting: Medical

## 2023-01-07 ENCOUNTER — Ambulatory Visit: Payer: Federal, State, Local not specified - PPO | Attending: Internal Medicine | Admitting: Medical

## 2023-01-07 VITALS — BP 116/94 | HR 101 | Ht 69.0 in | Wt 352.6 lb

## 2023-01-07 DIAGNOSIS — I2510 Atherosclerotic heart disease of native coronary artery without angina pectoris: Secondary | ICD-10-CM | POA: Diagnosis not present

## 2023-01-07 DIAGNOSIS — I428 Other cardiomyopathies: Secondary | ICD-10-CM

## 2023-01-07 DIAGNOSIS — E785 Hyperlipidemia, unspecified: Secondary | ICD-10-CM

## 2023-01-07 DIAGNOSIS — I5021 Acute systolic (congestive) heart failure: Secondary | ICD-10-CM | POA: Diagnosis not present

## 2023-01-07 DIAGNOSIS — I5020 Unspecified systolic (congestive) heart failure: Secondary | ICD-10-CM | POA: Diagnosis not present

## 2023-01-07 DIAGNOSIS — I10 Essential (primary) hypertension: Secondary | ICD-10-CM

## 2023-01-07 DIAGNOSIS — U071 COVID-19: Secondary | ICD-10-CM

## 2023-01-07 DIAGNOSIS — E1169 Type 2 diabetes mellitus with other specified complication: Secondary | ICD-10-CM

## 2023-01-07 LAB — BASIC METABOLIC PANEL
Anion gap: 10 (ref 5–15)
BUN: 28 mg/dL — ABNORMAL HIGH (ref 6–20)
CO2: 29 mmol/L (ref 22–32)
Calcium: 9.6 mg/dL (ref 8.9–10.3)
Chloride: 101 mmol/L (ref 98–111)
Creatinine, Ser: 1.76 mg/dL — ABNORMAL HIGH (ref 0.61–1.24)
GFR, Estimated: 49 mL/min — ABNORMAL LOW (ref >60)
Glucose, Bld: 141 mg/dL — ABNORMAL HIGH (ref 70–99)
Potassium: 3.8 mmol/L (ref 3.5–5.1)
Sodium: 140 mmol/L (ref 135–145)

## 2023-01-07 MED ORDER — CARVEDILOL 25 MG PO TABS: 25.0000 mg | ORAL_TABLET | Freq: Two times a day (BID) | ORAL | 3 refills | Status: DC

## 2023-01-07 NOTE — Patient Instructions (Signed)
Medication Instructions:   Your physician has recommended you make the following change in your medication:   STOP Amlodipine.  INCREASE Carvedilol (Coreg) 25 mg tablet by mouth twice daily.   *If you need a refill on your cardiac medications before your next appointment, please call your pharmacy*   Lab Work:  Your physician recommends that you have lab work: Today at [facility].  BMP  If you have labs (blood work) drawn today and your tests are completely normal, you will receive your results only by: [place] (if you have MyChart) OR A paper copy in the mail If you have any lab test that is abnormal or we need to change your treatment, we will call you to review the results.   Testing/Procedures:  NONE   Follow-Up: At [facility], you and your health needs are our priority.  As part of our continuing mission to provide you with exceptional heart care, we have created designated Provider Care Teams.  These Care Teams include your primary Cardiologist (physician) and Advanced Practice Providers (APPs -  Physician Assistants and Nurse Practitioners) who all work together to provide you with the care you need, when you need it.  We recommend signing up for the patient portal called "MyChart".  Sign up information is provided on this After Visit Summary.  MyChart is used to connect with patients for Virtual Visits (Telemedicine).  Patients are able to view lab/test results, encounter notes, upcoming appointments, etc.  Non-urgent messages can be sent to your provider as well.   To learn more about what you can do with MyChart, go to [email or web address].    Your next appointment:   3 month(s)  Provider:   You may see or one of the following Advanced Practice Providers on your designated Care Team:   Murray Hodgkins, NP Christell Faith, PA-C Cadence Kathlen Mody, PA-C Gerrie Nordmann, NP

## 2023-01-07 NOTE — Progress Notes (Signed)
Cardiology Office Note:    Date:  01/07/2023   ID:  Jordan Greer, DOB [birth year], MRN [id number]  PCP:  Wilhelmina Mcardle, MD  [facility] Cardiologist:  None  [facility] Electrophysiologist:  None   Referring MD: Wilhelmina Mcardle, MD   Chief Complaint: hospital follow-up  History of Present Illness:    Jordan Greer is a 42 y.o. male with a hx of HTN, OSA on CPAP, diabetes, HFrEF, NICM, and obesity who presents for hospital follow-up.   The patient was recently admitted with progressive shortness of breath, chills and cough. He was previously told he was COVID positive. Scr 1.59, BNP 481, HS trop 19>23. Ddimer 0.78. CTA chest showed no PE, but it did show bronchopneumonia, atypical etiology. The patient was given IV lasix. Echo showed reduced LVEF 35-40%, with WMA. Cardiac cath showed no CAD, moderately elevated left heart, right heart and pulmonary artery pressures. The patient was started on Paxlovid. The patient was started on GDMT.   Today, the patient is overall doing well. He finished taking Paxlovid. He is taking Entresto and spironolactone. He denies chest pain, SOB, LLE, orthopnea or pnd. Cath site is stable. Patient feels breathing is better. He does feel like fluid weight has gone down. He is back at work as a Designer, industrial/product. Ok to restart the Crestor.   Past Medical History:  Diagnosis Date   Colitis    Colitis    Diabetes mellitus without complication ([place])    Hyperlipidemia    Hypertension     Past Surgical History:  Procedure Laterality Date   none     RIGHT/LEFT HEART CATH AND CORONARY ANGIOGRAPHY N/A 12/25/2022   Procedure: RIGHT/LEFT HEART CATH AND CORONARY ANGIOGRAPHY;  Surgeon: Nelva Bush, MD;  Location: [facility];  Service: Cardiovascular;  Laterality: N/A;    Current Medications: Current Meds  Medication Sig   aspirin EC 81 MG tablet Take 1 tablet (81 mg total) by mouth daily. Swallow whole.   dapagliflozin propanediol (FARXIGA)  10 MG TABS tablet Take 1 tablet (10 mg total) by mouth daily.   glimepiride (AMARYL) 4 MG tablet Take 4 mg by mouth in the morning and at bedtime.   glipiZIDE (GLUCOTROL XL) 10 MG 24 hr tablet Take 10 mg by mouth 2 (two) times daily.    guaiFENesin-dextromethorphan (ROBITUSSIN DM) 100-10 MG/5ML syrup Take 10 mLs by mouth every 4 (four) hours as needed for cough.   hydrALAZINE (APRESOLINE) 25 MG tablet Take 1 tablet (25 mg total) by mouth every 8 (eight) hours.   isosorbide mononitrate (IMDUR) 30 MG 24 hr tablet Take 1 tablet (30 mg total) by mouth daily.   metFORMIN (GLUCOPHAGE-XR) 500 MG 24 hr tablet Take 1,000 mg by mouth 2 (two) times daily.   rosuvastatin (CRESTOR) 20 MG tablet Take 1 tablet (20 mg total) by mouth at bedtime. Please restart after finishing Paxlovid   sacubitril-valsartan (ENTRESTO) 97-103 MG Take 1 tablet by mouth 2 (two) times daily.   spironolactone (ALDACTONE) 25 MG tablet Take 1 tablet (25 mg total) by mouth daily.   Torsemide 60 MG TABS Take 60 mg by mouth daily.   vitamin C (VITAMIN C) 500 MG tablet Take 1 tablet (500 mg total) by mouth daily.   zinc sulfate 220 (50 Zn) MG capsule Take 1 capsule (220 mg total) by mouth daily.   [DISCONTINUED] amLODipine (NORVASC) 10 MG tablet Take 10 mg by mouth daily.   [DISCONTINUED] carvedilol (COREG) 12.5 MG tablet Take 1 tablet (  12.5 mg total) by mouth 2 (two) times daily with a meal.     Allergies:   Fish allergy   Social History   Socioeconomic History   Marital status: Married    Spouse name: Not on file   Number of children: Not on file   Years of education: Not on file   Highest education level: Not on file  Occupational History   Not on file  Tobacco Use   Smoking status: Never   Smokeless tobacco: Never  Substance and Sexual Activity   Alcohol use: Yes    Alcohol/week: 18.0 standard drinks of alcohol    Types: 18 Cans of beer per week   Drug use: No   Sexual activity: Yes  Other Topics Concern   Not on  file  Social History Narrative   Not on file   Social Determinants of Health   Financial Resource Strain: Not on file  Food Insecurity: No Food Insecurity (12/24/2022)   Hunger Vital Sign    Worried About Running Out of Food in the Last Year: Never true    Ran Out of Food in the Last Year: Never true  Transportation Needs: No Transportation Needs (12/24/2022)   PRAPARE - Hydrologist (Medical): No    Lack of Transportation (Non-Medical): No  Physical Activity: Not on file  Stress: Not on file  Social Connections: Not on file     Family History: The patient's family history includes Diabetes Mellitus II in his mother; Hypertension in his mother.  ROS:   Please see the history of present illness.     All other systems reviewed and are negative.  EKGs/Labs/Other Studies Reviewed:    The following studies were reviewed today:   Right/left heart cath 12/2022 Conclusions: No angiographically significant coronary artery disease.  Findings are consistent with nonischemic cardiomyopathy. Moderately elevated left heart, right heart, and pulmonary artery pressures. Normal Fick cardiac output/index.   Recommendations: Continue diuresis. Escalate GDMT as tolerated. Primary prevention of coronary artery disease.   Nelva Bush, MD [facility]  Echo 12/2022  1. Left ventricular ejection fraction, by estimation, is 35 to 40%. The  left ventricle has moderately decreased function. The left ventricle  demonstrates regional wall motion abnormalities (see scoring  diagram/findings for description). There is  moderate left ventricular hypertrophy. Left ventricular diastolic  parameters are indeterminate. There is moderate hypokinesis of the left  ventricular, basal-mid inferior wall.   2. Right ventricular systolic function is normal. The right ventricular  size is normal. There is mildly elevated pulmonary artery systolic  pressure.   3. Left atrial  size was moderately dilated.   4. The mitral valve is normal in structure. No evidence of mitral valve  regurgitation. No evidence of mitral stenosis.   5. The aortic valve is normal in structure. Aortic valve regurgitation is  not visualized. No aortic stenosis is present.   6. The inferior vena cava is normal in size with <50% respiratory  variability, suggesting right atrial pressure of 8 mmHg.    EKG:  EKG is ordered today.  The ekg ordered today demonstrates NSR 99pm, nonspecific T wave changes  Recent Labs: 12/23/2022: B Natriuretic Peptide 481.7 12/24/2022: Magnesium 2.1; Platelets 260 12/25/2022: Hemoglobin 16.0 12/26/2022: ALT 19 01/07/2023: BUN 28; Creatinine, Ser 1.76; Potassium 3.8; Sodium 140  Recent Lipid Panel    Component Value Date/Time   CHOL 118 12/24/2022 0245   TRIG 108 12/24/2022 0245   HDL 36 (L) 12/24/2022  0245   CHOLHDL 3.3 12/24/2022 0245   VLDL 22 12/24/2022 0245   LDLCALC 60 12/24/2022 0245     Physical Exam:    VS:  BP (!) 116/94 (BP Location: Left Arm, Patient Position: Sitting, Cuff Size: Large)   Pulse (!) 101   Ht 5' 9"$  (1.753 m)   Wt (!) 352 lb 9.6 oz (159.9 kg)   SpO2 97%   BMI 52.07 kg/m     Wt Readings from Last 3 Encounters:  01/07/23 (!) 352 lb 9.6 oz (159.9 kg)  12/24/22 (!) 359 lb 9.6 oz (163.1 kg)  06/12/19 (!) 355 lb 12.8 oz (161.4 kg)     GEN:  Well nourished, well developed in no acute distress HEENT: Normal NECK: No JVD; No carotid bruits LYMPHATICS: No lymphadenopathy CARDIAC: RRR, no murmurs, rubs, gallops RESPIRATORY:  Clear to auscultation without rales, wheezing or rhonchi  ABDOMEN: Soft, non-tender, non-distended MUSCULOSKELETAL:  No edema; No deformity  SKIN: Warm and dry NEUROLOGIC:  Alert and oriented x 3 PSYCHIATRIC:  Normal affect   ASSESSMENT:    1. HFrEF (heart failure with reduced ejection fraction) ([place])   2. Nonischemic cardiomyopathy ([place])   3. Coronary artery disease involving native coronary artery of  native heart without angina pectoris   4. Essential hypertension   5. Type 2 diabetes mellitus with hyperlipidemia ([place])   6. COVID-19    PLAN:    In order of problems listed above:  HFrEF NICM Recent admission for COVID and acute heart failure found to have reduced LVEF 35-40%. Cath showed mild to mod nonobstructive CAD. The patient was diuresed and placed on GDMT at discharge. He is euvolemic on exam today. He is still on amlodipine. I will stop amlodipine for low EF and increase Coreg to 36m BID. Continue Entrseto 97-1061mBID, Coreg 2579mID, spironolactone 8m34mily, Farxiga 10mg84mly, Hydralazine 8mg 32m Imdur 30mg d31m, and Torsemide 60mg da8m K was 3.3 on d/c. He does not appear to be on supplemental K. BMET today. Patient has an appointment with advanced heart failure clinic next week. Plan to repeat echo in 2-3 months.   Mild to moderate CAD Nonobstructive CAD by recent cath. Cath site stable. No chest pain reported. Continue Aspirin and Coreg. Restart Crestor as he is done with Paxlovid. No further ischemic work-up indicated at this time.   HTN BP is good today, stop amlodipine as above, and increase Coreg. Continue all other medications.   DM2 Most recent A1c 6.0. Followed by PCP.   COVID-19 Patient completed Paxlovid.   Disposition: Follow up in 3 month(s) with MD/APP    Signed, Kasheem Toner H Maelys Kinnick,Ninfa Meeker2/19/2024 4:36 PM    [facility]

## 2023-01-08 ENCOUNTER — Telehealth: Payer: Self-pay

## 2023-01-08 DIAGNOSIS — I5020 Unspecified systolic (congestive) heart failure: Secondary | ICD-10-CM

## 2023-01-08 MED ORDER — TORSEMIDE 20 MG PO TABS: 20.0000 mg | ORAL_TABLET | Freq: Every day | ORAL | 0 refills | Status: DC

## 2023-01-08 NOTE — Telephone Encounter (Signed)
-----   Message from [place], PA-C sent at 01/08/2023  1:45 PM EST ----- Labs show dehydration.  Lets decrease torsemide to 20 mg daily.  Repeat bmet in 2 weeks.

## 2023-01-08 NOTE — Addendum Note (Signed)
Addended by: James Ivanoff D on: 01/08/2023 04:14 PM   Modules accepted: Orders

## 2023-01-12 NOTE — Progress Notes (Unsigned)
Patient ID: Jordan Greer, male    DOB: May 09, 1981, 42 y.o.   MRN: [id number]  HPI  Jordan Greer is a 42 y/o male with a history of DM, hyperlipidemia, HTN, OSA and chronic heart failure.   Echo 12/24/22 showed an EF of 35-40% along with moderate LVH along with mildly elevated PA pressure and moderate LAE.   RHC/LHC 12/25/22: No angiographically significant coronary artery disease.  Findings are consistent with nonischemic cardiomyopathy. Moderately elevated left heart, right heart, and pulmonary artery pressures. Normal Fick cardiac output/index.  Admitted 12/23/22 due to SOB due to HF and covid. CTA negative for PE.   He presents today for his initial HF visit with a chief complaint of minimal SOB with moderate exertion. Describes this as chronic in nature. Has associated fatigue and dizziness along with this. Denies any difficulty sleeping, abdominal distention, palpitations, pedal edema, chest pain, cough or weight gain.   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 Reports feeling quite tired after doing that.   Past Medical History:  Diagnosis Date   CHF (congestive heart failure) (HCC)    Colitis    Colitis    Diabetes mellitus without complication ([place])    Hyperlipidemia    Hypertension    Obstructive sleep apnea    Past Surgical History:  Procedure Laterality Date   none     RIGHT/LEFT HEART CATH AND CORONARY ANGIOGRAPHY N/A 12/25/2022   Procedure: RIGHT/LEFT HEART CATH AND CORONARY ANGIOGRAPHY;  Surgeon: Nelva Bush, MD;  Location: [facility];  Service: Cardiovascular;  Laterality: N/A;   Family History  Problem Relation Age of Onset   Hypertension Mother    Diabetes Mellitus II Mother    Social History   Tobacco Use   Smoking status: Never   Smokeless tobacco: Never  Substance Use Topics   Alcohol use: Yes    Alcohol/week: 18.0 standard drinks of alcohol    Types: 18 Cans of beer per week    Allergies  Allergen Reactions   Fish Allergy Itching and Nausea And Vomiting   Prior to Admission medications   Medication Sig Start Date End Date Taking? Authorizing Provider  aspirin EC 81 MG tablet Take 1 tablet (81 mg total) by mouth daily. Swallow whole. 12/27/22  Yes Lorella Nimrod, MD  carvedilol (COREG) 25 MG tablet Take 1 tablet (25 mg total) by mouth 2 (two) times daily with a meal. 01/07/23  Yes Furth, Cadence H, PA-C  dapagliflozin propanediol (FARXIGA) 10 MG TABS tablet Take 1 tablet (10 mg total) by mouth daily. 12/27/22  Yes Lorella Nimrod, MD  glipiZIDE (GLUCOTROL XL) 10 MG 24 hr tablet Take 10 mg by mouth 2 (two) times daily.    Yes [provider]  isosorbide mononitrate (IMDUR) 30 MG 24 hr tablet Take 1 tablet (30 mg total) by mouth daily. 12/26/22  Yes Lorella Nimrod, MD  metFORMIN (GLUCOPHAGE-XR) 500 MG 24 hr tablet Take 1,000 mg by mouth 2 (two) times daily.   Yes [provider]  OZEMPIC, 2 MG/DOSE, 8 MG/3ML SOPN Inject 2 mg into the skin once a week.   Yes [provider]  rosuvastatin (CRESTOR) 20 MG tablet Take 1 tablet (20 mg total) by mouth at bedtime. Please restart after finishing Paxlovid 12/26/22  Yes Lorella Nimrod, MD  sacubitril-valsartan (ENTRESTO) 97-103 MG Take 1 tablet by mouth 2 (two) times daily. 12/26/22  Yes Lorella Nimrod, MD  spironolactone (ALDACTONE) 25  MG tablet Take 1 tablet (25 mg total) by mouth daily. 12/26/22  Yes Lorella Nimrod, MD  torsemide (DEMADEX) 20 MG tablet Take 1 tablet (20 mg total) by mouth daily. 01/08/23 04/08/23 Yes Agbor-Etang, Aaron Edelman, MD  vitamin C (VITAMIN C) 500 MG tablet Take 1 tablet (500 mg total) by mouth daily. 03/01/19  Yes Lang Snow, NP  zinc sulfate 220 (50 Zn) MG capsule Take 1 capsule (220 mg total) by mouth daily. 12/27/22  Yes Lorella Nimrod, MD  albuterol (VENTOLIN HFA) 108 (90 Base) MCG/ACT inhaler Inhale 2 puffs into the lungs every 6 (six) hours as needed for shortness of breath or  wheezing. Patient not taking: Reported on 01/07/2023 12/23/22 12/23/23  [provider]  glimepiride (AMARYL) 4 MG tablet Take 4 mg by mouth in the morning and at bedtime. Patient not taking: Reported on 01/14/2023 01/26/20   [provider]  hydrALAZINE (APRESOLINE) 25 MG tablet Take 1 tablet (25 mg total) by mouth every 8 (eight) hours. Patient not taking: Reported on 01/14/2023 12/26/22   Lorella Nimrod, MD  Ipratropium-Albuterol (COMBIVENT) 20-100 MCG/ACT AERS respimat Inhale 1 puff into the lungs every 6 (six) hours. Patient not taking: Reported on 01/07/2023 12/26/22   Lorella Nimrod, MD   Review of Systems  Constitutional:  Positive for fatigue. Negative for appetite change.  HENT:  Negative for congestion, postnasal drip and sore throat.   Eyes: Negative.   Respiratory:  Positive for shortness of breath (minimal). Negative for cough and chest tightness.   Cardiovascular:  Negative for chest pain, palpitations and leg swelling.  Gastrointestinal:  Negative for abdominal distention and abdominal pain.  Endocrine: Negative.   Genitourinary: Negative.   Musculoskeletal:  Negative for back pain and neck pain.  Skin: Negative.   Allergic/Immunologic: Negative.   Neurological:  Positive for dizziness (with sudden position changes). Negative for light-headedness.  Hematological:  Negative for adenopathy. Does not bruise/bleed easily.  Psychiatric/Behavioral:  Negative for dysphoric mood and sleep disturbance (sleeping on 2 pillows w/ CPAP). The patient is not nervous/anxious.     Vitals:   01/14/23 1137  BP: 122/73  Pulse: 88  Resp: 16  SpO2: 97%  Weight: (!) 356 lb 2 oz (161.5 kg)   Wt Readings from Last 3 Encounters:  01/14/23 (!) 356 lb 2 oz (161.5 kg)  01/07/23 (!) 352 lb 9.6 oz (159.9 kg)  12/24/22 (!) 359 lb 9.6 oz (163.1 kg)   Lab Results  Component Value Date   CREATININE 1.76 (H) 01/07/2023   CREATININE 1.42 (H) 12/26/2022   CREATININE 1.47 (H) 12/25/2022    Physical Exam Vitals and nursing note reviewed.  Constitutional:      Appearance: Normal appearance.  HENT:     Head: Normocephalic and atraumatic.  Cardiovascular:     Rate and Rhythm: Normal rate and regular rhythm.  Pulmonary:     Effort: Pulmonary effort is normal. No respiratory distress.     Breath sounds: No wheezing or rales.  Abdominal:     General: There is no distension.     Palpations: Abdomen is soft.     Tenderness: There is no abdominal tenderness.  Musculoskeletal:        General: No tenderness.     Cervical back: Normal range of motion and neck supple.     Right lower leg: No edema.     Left lower leg: No edema.  Skin:    General: Skin is warm and dry.  Neurological:     General:  No focal deficit present.     Mental Status: He is alert and oriented to person, place, and time.  Psychiatric:        Behavior: Behavior normal.        Thought Content: Thought content normal.   Assessment & Plan:  1: Chronic heart failure with reduced ejection fraction- - NYHA class II - euvolemic - weighing daily; reminded to call for overnight weight gain of > 2 pounds or a weekly weight gain of > 5 pounds - echo 12/24/22 showed an EF of 35-40% along with moderate LVH along with mildly elevated PA pressure and moderate LAE.  - RHC/LHC 12/25/22 showed no angiographically significant coronary artery disease.  Findings are consistent with nonischemic cardiomyopathy. Moderately elevated left heart, right heart, and pulmonary artery pressures. Normal Fick cardiac output/index. - refer to [facility] provider for further evaluation - carvedilol '25mg'$  BID - farxiga '10mg'$  daily - entresto 97/'103mg'$  BID - spironolactone '25mg'$  daily - torsemide '20mg'$  daily; plans to have BMET checked next week - saw cardiology Kathlen Mody) 01/07/23 - BNP 12/23/22 was 481.7 - PharmD reconciled meds w/ patient  2: HTN- - BP 122/73 - saw PCP Raul Del) 09/10/22; trying to get f/u appt scheduled - BMP 01/07/23 showed  sodium 140, potassium 3.8, creatinine 1.76 & GFR 49  3: DM- - A1c 12/24/22 was 6.0% - glipizide '10mg'$  BID - metformin XR '100mg'$  BID  4: Hyperlipidemia- - LDL 12/24/22 was 60 - rosuvastatin '20mg'$  daily  5: OSA- - wears CPAP nightly   Medication list reviewed.   Return in 3 weeks, sooner if needed.

## 2023-01-14 ENCOUNTER — Ambulatory Visit: Payer: Federal, State, Local not specified - PPO | Attending: Family | Admitting: Family

## 2023-01-14 ENCOUNTER — Encounter: Payer: Self-pay | Admitting: Family

## 2023-01-14 ENCOUNTER — Encounter: Payer: Self-pay | Admitting: Pharmacist

## 2023-01-14 VITALS — BP 122/73 | HR 88 | Resp 16 | Wt 356.1 lb

## 2023-01-14 DIAGNOSIS — I5022 Chronic systolic (congestive) heart failure: Secondary | ICD-10-CM | POA: Diagnosis not present

## 2023-01-14 DIAGNOSIS — Z79899 Other long term (current) drug therapy: Secondary | ICD-10-CM | POA: Insufficient documentation

## 2023-01-14 DIAGNOSIS — I10 Essential (primary) hypertension: Secondary | ICD-10-CM | POA: Diagnosis not present

## 2023-01-14 DIAGNOSIS — E1169 Type 2 diabetes mellitus with other specified complication: Secondary | ICD-10-CM

## 2023-01-14 DIAGNOSIS — I110 Hypertensive heart disease with heart failure: Secondary | ICD-10-CM | POA: Insufficient documentation

## 2023-01-14 DIAGNOSIS — E785 Hyperlipidemia, unspecified: Secondary | ICD-10-CM | POA: Diagnosis not present

## 2023-01-14 DIAGNOSIS — I5020 Unspecified systolic (congestive) heart failure: Secondary | ICD-10-CM

## 2023-01-14 DIAGNOSIS — G4733 Obstructive sleep apnea (adult) (pediatric): Secondary | ICD-10-CM

## 2023-01-14 DIAGNOSIS — E119 Type 2 diabetes mellitus without complications: Secondary | ICD-10-CM | POA: Diagnosis not present

## 2023-01-14 NOTE — Patient Instructions (Signed)
Continue weighing daily and call for an overnight weight gain of 3 pounds or more or a weekly weight gain of more than 5 pounds. ? ?

## 2023-01-21 ENCOUNTER — Other Ambulatory Visit
Admission: RE | Admit: 2023-01-21 | Discharge: 2023-01-21 | Disposition: A | Discharge status: Home / Self Care | Payer: Federal, State, Local not specified - PPO | Attending: Cardiology | Admitting: Cardiology

## 2023-01-21 DIAGNOSIS — I5020 Unspecified systolic (congestive) heart failure: Secondary | ICD-10-CM | POA: Insufficient documentation

## 2023-01-21 LAB — BASIC METABOLIC PANEL
Anion gap: 11 (ref 5–15)
BUN: 27 mg/dL — ABNORMAL HIGH (ref 6–20)
CO2: 30 mmol/L (ref 22–32)
Calcium: 9 mg/dL (ref 8.9–10.3)
Chloride: 98 mmol/L (ref 98–111)
Creatinine, Ser: 1.84 mg/dL — ABNORMAL HIGH (ref 0.61–1.24)
GFR, Estimated: 46 mL/min — ABNORMAL LOW (ref >60)
Glucose, Bld: 142 mg/dL — ABNORMAL HIGH (ref 70–99)
Potassium: 3.4 mmol/L — ABNORMAL LOW (ref 3.5–5.1)
Sodium: 139 mmol/L (ref 135–145)

## 2023-01-24 NOTE — Progress Notes (Signed)
Patient ID: Jordan Greer, male   DOB: May 13, 1981, 42 y.o.   MRN: [id number]  [facility] COUNSELING NOTE  *HFrEF*  Guideline-Directed Medical Therapy/Evidence Based Medicine  ACE/ARB/ARNI: Sacubitril-valsartan 97-103 mg twice daily Beta Blocker: Carvedilol 25 mg twice daily Aldosterone Antagonist: Spironolactone 25 mg daily Diuretic: Torsemide 20 mg daily SGLT2i: Dapagliflozin 10 mg daily  Adherence Assessment  Do you ever forget to take your medication? '[]'$ Yes '[x]'$ No  Do you ever skip doses due to side effects? '[]'$ Yes '[x]'$ No  Do you have trouble affording your medicines? '[x]'$ Yes '[]'$ No  Are you ever unable to pick up your medication due to transportation difficulties? '[]'$ Yes '[x]'$ No  Do you ever stop taking your medications because you don't believe they are helping? '[]'$ Yes '[x]'$ No  Do you check your weight daily? '[x]'$ Yes '[]'$ No   Adherence strategy: pill box  Barriers to obtaining medications: none  Vital signs: HR 88, BP 122/73, weight (pounds) 356 lb  ECHO: Date 12/24/22,  EF of 35-40% along with moderate LVH along with mildly elevated PA pressure and moderate LAE.       Latest Ref Rng & Units 01/21/2023   11:07 AM 01/07/2023    3:27 PM 12/26/2022    3:38 AM  BMP  Glucose 70 - 99 mg/dL 142  141  112   BUN 6 - 20 mg/dL '27  28  16   '$ Creatinine 0.61 - 1.24 mg/dL 1.84  1.76  1.42   Sodium 135 - 145 mmol/L 139  140  136   Potassium 3.5 - 5.1 mmol/L 3.4  3.8  3.3   Chloride 98 - 111 mmol/L 98  101  101   CO2 22 - 32 mmol/L '30  29  24   '$ Calcium 8.9 - 10.3 mg/dL 9.0  9.6  9.2     Past Medical History:  Diagnosis Date   CHF (congestive heart failure) (HCC)    Colitis    Colitis    Diabetes mellitus without complication ([place])    Hyperlipidemia    Hypertension    Obstructive sleep apnea     ASSESSMENT 42 year old male who presents to the HF clinic for initial visit. PMH includes DM with A1c = 6%, hyperlipidemia, HTN, OSA and  chronic heart failure.   Last acute care admission was 12/23/2022 for acute HF. Since his discharge patient is feeling better but reports dizziness on standing. Denies any falls or additional ADR.   Medication reconciliation was completed during OV. Denies any other ADR to therapy or issues to afford medication.   PLAN (recommendation) Decrease spironolactone to 12.'5mg'$  daily for now Check BP daily and keep log BMET in 1-2 weeks Follow up as directed by provider  Time spent: 20 minutes  Shawonda Kerce Rodriguez-Guzman PharmD, BCPS 01/24/2023 2:42 PM     Current Outpatient Medications:    albuterol (VENTOLIN HFA) 108 (90 Base) MCG/ACT inhaler, Inhale 2 puffs into the lungs every 6 (six) hours as needed for shortness of breath or wheezing. (Patient not taking: Reported on 01/07/2023), Disp: , Rfl:    aspirin EC 81 MG tablet, Take 1 tablet (81 mg total) by mouth daily. Swallow whole., Disp: 30 tablet, Rfl: 12   carvedilol (COREG) 25 MG tablet, Take 1 tablet (25 mg total) by mouth 2 (two) times daily with a meal., Disp: 60 tablet, Rfl: 3   dapagliflozin propanediol (FARXIGA) 10 MG TABS tablet, Take 1 tablet (10 mg total) by mouth daily., Disp: 30 tablet, Rfl: 2  glimepiride (AMARYL) 4 MG tablet, Take 4 mg by mouth in the morning and at bedtime. (Patient not taking: Reported on 01/14/2023), Disp: , Rfl:    glipiZIDE (GLUCOTROL XL) 10 MG 24 hr tablet, Take 10 mg by mouth 2 (two) times daily. , Disp: , Rfl:    hydrALAZINE (APRESOLINE) 25 MG tablet, Take 1 tablet (25 mg total) by mouth every 8 (eight) hours. (Patient not taking: Reported on 01/14/2023), Disp: 90 tablet, Rfl: 0   Ipratropium-Albuterol (COMBIVENT) 20-100 MCG/ACT AERS respimat, Inhale 1 puff into the lungs every 6 (six) hours. (Patient not taking: Reported on 01/07/2023), Disp: 4 g, Rfl: 0   isosorbide mononitrate (IMDUR) 30 MG 24 hr tablet, Take 1 tablet (30 mg total) by mouth daily., Disp: 30 tablet, Rfl: 0   metFORMIN (GLUCOPHAGE-XR) 500 MG 24  hr tablet, Take 1,000 mg by mouth 2 (two) times daily., Disp: , Rfl:    OZEMPIC, 2 MG/DOSE, 8 MG/3ML SOPN, Inject 2 mg into the skin once a week., Disp: , Rfl:    rosuvastatin (CRESTOR) 20 MG tablet, Take 1 tablet (20 mg total) by mouth at bedtime. Please restart after finishing Paxlovid, Disp: , Rfl:    sacubitril-valsartan (ENTRESTO) 97-103 MG, Take 1 tablet by mouth 2 (two) times daily., Disp: 60 tablet, Rfl: 1   spironolactone (ALDACTONE) 25 MG tablet, Take 1 tablet (25 mg total) by mouth daily., Disp: 30 tablet, Rfl: 1   torsemide (DEMADEX) 20 MG tablet, Take 1 tablet (20 mg total) by mouth daily., Disp: 90 tablet, Rfl: 0   vitamin C (VITAMIN C) 500 MG tablet, Take 1 tablet (500 mg total) by mouth daily., Disp: 30 tablet, Rfl: 0   zinc sulfate 220 (50 Zn) MG capsule, Take 1 capsule (220 mg total) by mouth daily., Disp: 90 capsule, Rfl: 0    MEDICATION ADHERENCES TIPS AND STRATEGIES Taking medication as prescribed improves patient outcomes in heart failure (reduces hospitalizations, improves symptoms, increases survival) Side effects of medications can be managed by decreasing doses, switching agents, stopping drugs, or adding additional therapy. Please let someone in the [facility] know if you have having bothersome side effects so we can modify your regimen. Do not alter your medication regimen without talking to [place].  Medication reminders can help patients remember to take drugs on time. If you are missing or forgetting doses you can try linking behaviors, using pill boxes, or an electronic reminder like an alarm on your phone or an app. Some people can also get automated phone calls as medication reminders.

## 2023-01-30 ENCOUNTER — Other Ambulatory Visit: Payer: Self-pay | Admitting: *Deleted

## 2023-01-30 DIAGNOSIS — I5042 Chronic combined systolic (congestive) and diastolic (congestive) heart failure: Secondary | ICD-10-CM

## 2023-02-07 ENCOUNTER — Other Ambulatory Visit
Admission: RE | Admit: 2023-02-07 | Discharge: 2023-02-07 | Disposition: A | Discharge status: Home / Self Care | Payer: Federal, State, Local not specified - PPO | Source: Ambulatory Visit | Attending: Cardiology | Admitting: Cardiology

## 2023-02-07 ENCOUNTER — Encounter: Payer: Self-pay | Admitting: Cardiology

## 2023-02-07 ENCOUNTER — Ambulatory Visit: Payer: Federal, State, Local not specified - PPO | Attending: Cardiology | Admitting: Cardiology

## 2023-02-07 VITALS — BP 130/94 | HR 89 | Wt 357.0 lb

## 2023-02-07 DIAGNOSIS — I5020 Unspecified systolic (congestive) heart failure: Secondary | ICD-10-CM | POA: Diagnosis not present

## 2023-02-07 DIAGNOSIS — G4733 Obstructive sleep apnea (adult) (pediatric): Secondary | ICD-10-CM

## 2023-02-07 DIAGNOSIS — I10 Essential (primary) hypertension: Secondary | ICD-10-CM | POA: Diagnosis not present

## 2023-02-07 DIAGNOSIS — N189 Chronic kidney disease, unspecified: Secondary | ICD-10-CM

## 2023-02-07 DIAGNOSIS — E1169 Type 2 diabetes mellitus with other specified complication: Secondary | ICD-10-CM

## 2023-02-07 DIAGNOSIS — E785 Hyperlipidemia, unspecified: Secondary | ICD-10-CM

## 2023-02-07 LAB — BASIC METABOLIC PANEL
Anion gap: 11 (ref 5–15)
BUN: 23 mg/dL — ABNORMAL HIGH (ref 6–20)
CO2: 25 mmol/L (ref 22–32)
Calcium: 9.3 mg/dL (ref 8.9–10.3)
Chloride: 100 mmol/L (ref 98–111)
Creatinine, Ser: 1.71 mg/dL — ABNORMAL HIGH (ref 0.61–1.24)
GFR, Estimated: 50 mL/min — ABNORMAL LOW (ref >60)
Glucose, Bld: 122 mg/dL — ABNORMAL HIGH (ref 70–99)
Potassium: 3.8 mmol/L (ref 3.5–5.1)
Sodium: 136 mmol/L (ref 135–145)

## 2023-02-07 LAB — BRAIN NATRIURETIC PEPTIDE: B Natriuretic Peptide: 56.1 pg/mL (ref 0.0–100.0)

## 2023-02-07 MED ORDER — HYDRALAZINE HCL 25 MG PO TABS: 37.5000 mg | ORAL_TABLET | Freq: Three times a day (TID) | ORAL | 0 refills | Status: DC

## 2023-02-07 NOTE — Patient Instructions (Signed)
INCREASE Hydralazine to 37.5mg  three times daily  Routine lab work today. Will notify you of abnormal results  Follow up in 6 weeks   Do the following things EVERYDAY: Weigh yourself in the morning before breakfast. Write it down and keep it in a log. Take your medicines as prescribed Eat low salt foods--Limit salt (sodium) to 2000 mg per day.  Stay as active as you can everyday Limit all fluids for the day to less than 2 liters

## 2023-02-07 NOTE — Progress Notes (Signed)
ADVANCED HEART FAILURE CLINIC NOTE  Referring Physician: Wilhelmina Mcardle, MD  Primary Care: Wilhelmina Mcardle, MD Primary Cardiologist:  HPI: Jordan Greer is a 42 y.o. male with HFrEF 2/2 NICM, HTN, OSA on CPAP, T2DM, and obesity presenting today to establish care. Jordan Greer' cardiac history dates back to February 2024 when he was admitted with acute onset shortness of breath.  During that admission he was found to be significantly hypertensive requiring multiple antihypertensive medications for control.  In addition he underwent right and left heart cath with which demonstrated preserved cardiac index and no significant coronary artery disease.  He had an echocardiogram with LVEF of 35%.  Patient was discharged home on GDMT.  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 He reports becoming fatigued at work several times a week but with no shortness of breath, chest pressure or lightheadedness.   Activity level/exercise tolerance: NYHA II Orthopnea:  Sleeps on 2 pillows Paroxysmal noctural dyspnea: No Chest pain/pressure: No Orthostatic lightheadedness:  No Palpitations:  No Lower extremity edema:  No Presyncope/syncope:  No Cough:  No  Past Medical History:  Diagnosis Date   CHF (congestive heart failure) (HCC)    Colitis    Colitis    Diabetes mellitus without complication (HCC)    Hyperlipidemia    Hypertension    Obstructive sleep apnea     Current Outpatient Medications  Medication Sig Dispense Refill   aspirin EC 81 MG tablet Take 1 tablet (81 mg total) by mouth daily. Swallow whole. 30 tablet 12   carvedilol (COREG) 25 MG tablet Take 1 tablet (25 mg total) by mouth 2 (two) times daily with a meal. 60 tablet 3   dapagliflozin propanediol (FARXIGA) 10 MG TABS tablet Take 1 tablet (10 mg total) by mouth daily. 30 tablet 2   glipiZIDE (GLUCOTROL XL) 10 MG 24 hr tablet Take 10 mg by mouth 2 (two) times daily.      isosorbide  mononitrate (IMDUR) 30 MG 24 hr tablet Take 1 tablet (30 mg total) by mouth daily. 30 tablet 0   metFORMIN (GLUCOPHAGE-XR) 500 MG 24 hr tablet Take 1,000 mg by mouth 2 (two) times daily.     OZEMPIC, 2 MG/DOSE, 8 MG/3ML SOPN Inject 2 mg into the skin once a week.     rosuvastatin (CRESTOR) 20 MG tablet Take 1 tablet (20 mg total) by mouth at bedtime. Please restart after finishing Paxlovid     sacubitril-valsartan (ENTRESTO) 97-103 MG Take 1 tablet by mouth 2 (two) times daily. 60 tablet 1   spironolactone (ALDACTONE) 25 MG tablet Take 1 tablet (25 mg total) by mouth daily. 30 tablet 1   torsemide (DEMADEX) 20 MG tablet Take 1 tablet (20 mg total) by mouth daily. 90 tablet 0   zinc sulfate 220 (50 Zn) MG capsule Take 1 capsule (220 mg total) by mouth daily. 90 capsule 0   albuterol (VENTOLIN HFA) 108 (90 Base) MCG/ACT inhaler Inhale 2 puffs into the lungs every 6 (six) hours as needed for shortness of breath or wheezing. (Patient not taking: Reported on 01/07/2023)     glimepiride (AMARYL) 4 MG tablet Take 4 mg by mouth in the morning and at bedtime. (Patient not taking: Reported on 01/14/2023)     hydrALAZINE (APRESOLINE) 25 MG tablet Take 1.5 tablets (37.5 mg total) by mouth every 8 (eight) hours. 90 tablet 0   Ipratropium-Albuterol (COMBIVENT) 20-100 MCG/ACT AERS respimat Inhale 1 puff into the  lungs every 6 (six) hours. (Patient not taking: Reported on 01/07/2023) 4 g 0   vitamin C (VITAMIN C) 500 MG tablet Take 1 tablet (500 mg total) by mouth daily. (Patient not taking: Reported on 02/07/2023) 30 tablet 0   No current facility-administered medications for this visit.    Allergies  Allergen Reactions   Fish Allergy Itching and Nausea And Vomiting      Social History   Socioeconomic History   Marital status: Married    Spouse name: Not on file   Number of children: Not on file   Years of education: Not on file   Highest education level: Not on file  Occupational History   Not on file   Tobacco Use   Smoking status: Never   Smokeless tobacco: Never  Substance and Sexual Activity   Alcohol use: Yes    Alcohol/week: 18.0 standard drinks of alcohol    Types: 18 Cans of beer per week   Drug use: No   Sexual activity: Yes  Other Topics Concern   Not on file  Social History Narrative   Not on file   Social Determinants of Health   Financial Resource Strain: Not on file  Food Insecurity: No Food Insecurity (12/24/2022)   Hunger Vital Sign    Worried About Running Out of Food in the Last Year: Never true    Ran Out of Food in the Last Year: Never true  Transportation Needs: No Transportation Needs (12/24/2022)   PRAPARE - Hydrologist (Medical): No    Lack of Transportation (Non-Medical): No  Physical Activity: Not on file  Stress: Not on file  Social Connections: Not on file  Intimate Partner Violence: Not At Risk (12/24/2022)   Humiliation, Afraid, Rape, and Kick questionnaire    Fear of Current or Ex-Partner: No    Emotionally Abused: No    Physically Abused: No    Sexually Abused: No      Family History  Problem Relation Age of Onset   Hypertension Mother    Diabetes Mellitus II Mother     PHYSICAL EXAM: Vitals:   02/07/23 0950  BP: (!) 130/94  Pulse: 89  SpO2: 98%   GENERAL: Obese male in no apparent distress HEENT: Negative for arcus senilis or xanthelasma. There is no scleral icterus.  The mucous membranes are pink and moist.   NECK: Supple, No masses. Normal carotid upstrokes without bruits. No masses or thyromegaly.    CHEST: There are no chest wall deformities. There is no chest wall tenderness. Respirations are unlabored.  Lungs-CTA bilaterally CARDIAC:  JVP: Difficult to assess due to body habitus         Normal S1, S2  Normal rate with regular rhythm. No murmurs, rubs or gallops.  Pulses are 2+ and symmetrical in upper and lower extremities.  Minimal pretibial edema.  ABDOMEN: Soft, non-tender, non-distended.  There are no masses or hepatomegaly. There are normal bowel sounds.  EXTREMITIES: Warm and well perfused with no cyanosis, clubbing.  LYMPHATIC: No axillary or supraclavicular lymphadenopathy.  NEUROLOGIC: Patient is oriented x3 with no focal or lateralizing neurologic deficits.  PSYCH: Patients affect is appropriate, there is no evidence of anxiety or depression.  SKIN: Warm and dry; no lesions or wounds.   DATA REVIEW  ECG: 01/07/2023: Normal sinus rhythm as per my personal interpretation  ECHO: LVEF 35%, moderate LVH, normal RV function as per my personal interpretation  CATH: 12/25/22: No angiographically significant coronary artery  disease.  Findings are consistent with nonischemic cardiomyopathy. Moderately elevated left heart, right heart, and pulmonary artery pressures. Normal Fick cardiac output/index.   ASSESSMENT & PLAN:  Heart failure with reduced ejection fraction Etiology of HF: Likely hypertensive cardiomyopathy.  If EF does not recover on repeat echocardiogram will plan for cardiac MRI. TSH ordered today. NYHA class / AHA Stage:IIB Volume status & Diuretics: Euvolemic, taking torsemide 20mg . Vasodilators:Entresto 97/103mg  BID,  Beta-Blocker: Coreg 25 mg daily MRA: Spironolactone 25 mg Cardiometabolic: Farxiga 10 mg daily Devices therapies & Valvulopathies: Not currently indicated Advanced therapies: Not currently indicated  2. Hypertension  -Now on Entresto 97/103 mg twice daily.  3. T2DM -Well-controlled.  Hemoglobin A1c of 6 in February 2024.  Now on Farxiga 10 mg.  4. Obesity -Body mass index is 52.72 kg/m. -Started on Ozempic several months ago -Weight loss discussed at length with the patient today  5. CKD -Rising creatinine on most recent lab work.  Will repeat BMP BNP.  May need to decrease dose of diuretics. -Continue Farxiga  Greater than 45 minutes spent with patient reviewing imaging, discussing medication compliance and importance of weight  loss  Jordan Greer Advanced Heart Failure Mechanical Circulatory Support

## 2023-03-21 ENCOUNTER — Encounter: Payer: Federal, State, Local not specified - PPO | Admitting: Cardiology

## 2023-03-28 DIAGNOSIS — I509 Heart failure, unspecified: Secondary | ICD-10-CM | POA: Diagnosis not present

## 2023-03-28 DIAGNOSIS — I10 Essential (primary) hypertension: Secondary | ICD-10-CM | POA: Diagnosis not present

## 2023-03-28 DIAGNOSIS — E1122 Type 2 diabetes mellitus with diabetic chronic kidney disease: Secondary | ICD-10-CM | POA: Diagnosis not present

## 2023-04-08 ENCOUNTER — Encounter: Payer: Federal, State, Local not specified - PPO | Admitting: Cardiology

## 2023-04-11 ENCOUNTER — Ambulatory Visit: Payer: Federal, State, Local not specified - PPO | Admitting: Internal Medicine

## 2023-04-11 NOTE — Progress Notes (Deleted)
   Follow-up Outpatient Visit Date: 04/11/2023  Primary Care Provider: Dan Maker, MD No address on file  Chief Complaint: ***  HPI:  Jordan Greer is a 42 y.o. male with history of chronic HFrEF due to nonischemic cardiomyopathy, hypertension, type 2 diabetes mellitus, obstructive sleep apnea on CPAP, and morbid obesity, who presents for follow-up of HFrEF.  He was admitted in February with newly diagnosed HFrEF in the setting of COVID-19 infection.  Right and left heart catheterization showed no significant CAD and normal cardiac output.  Left and right heart filling pressures were moderately elevated.  He was diuresed and initiated on goal-directed medical therapy.  He was feeling well at his hospital follow-up in mid February, at which time carvedilol was increased.  He subsequently saw Dr. Gasper Lloyd in the heart failure clinic in late March.  Hydralazine was increased to 37.5 mg 3 times daily.  No other medication changes or additional testing were pursued  --------------------------------------------------------------------------------------------------  ***  Recent CV Pertinent Labs: Lab Results  Component Value Date   CHOL 118 12/24/2022   HDL 36 (L) 12/24/2022   LDLCALC 60 12/24/2022   TRIG 108 12/24/2022   CHOLHDL 3.3 12/24/2022   BNP 56.1 02/07/2023   K 3.8 02/07/2023   MG 2.1 12/24/2022   BUN 23 (H) 02/07/2023   CREATININE 1.71 (H) 02/07/2023    Past medical and surgical history were reviewed and updated in EPIC.  No outpatient medications have been marked as taking for the 04/11/23 encounter (Appointment) with Millena Callins, Jordan Deer, MD.    Allergies: Fish allergy  Social History   Tobacco Use   Smoking status: Never   Smokeless tobacco: Never  Substance Use Topics   Alcohol use: Yes    Alcohol/week: 18.0 standard drinks of alcohol    Types: 18 Cans of beer per week   Drug use: No    Family History  Problem Relation Age of Onset   Hypertension Mother     Diabetes Mellitus II Mother     Review of Systems: A 12-system review of systems was performed and was negative except as noted in the HPI.  --------------------------------------------------------------------------------------------------  Physical Exam: There were no vitals taken for this visit.  General:  NAD. Neck: No JVD or HJR. Lungs: Clear to auscultation bilaterally without wheezes or crackles. Heart: Regular rate and rhythm without murmurs, rubs, or gallops. Abdomen: Soft, nontender, nondistended. Extremities: No lower extremity edema.  EKG:  ***  Lab Results  Component Value Date   WBC 7.6 12/24/2022   HGB 16.0 12/25/2022   HCT 47.0 12/25/2022   MCV 90.8 12/24/2022   PLT 260 12/24/2022    Lab Results  Component Value Date   NA 136 02/07/2023   K 3.8 02/07/2023   CL 100 02/07/2023   CO2 25 02/07/2023   BUN 23 (H) 02/07/2023   CREATININE 1.71 (H) 02/07/2023   GLUCOSE 122 (H) 02/07/2023   ALT 19 12/26/2022    Lab Results  Component Value Date   CHOL 118 12/24/2022   HDL 36 (L) 12/24/2022   LDLCALC 60 12/24/2022   TRIG 108 12/24/2022   CHOLHDL 3.3 12/24/2022    --------------------------------------------------------------------------------------------------  ASSESSMENT AND PLAN: Jordan Deer Rosalynn Sergent, MD 04/11/2023 6:50 AM

## 2023-04-12 ENCOUNTER — Ambulatory Visit (HOSPITAL_BASED_OUTPATIENT_CLINIC_OR_DEPARTMENT_OTHER): Payer: Federal, State, Local not specified - PPO | Admitting: Cardiology

## 2023-04-12 ENCOUNTER — Other Ambulatory Visit
Admission: RE | Admit: 2023-04-12 | Discharge: 2023-04-12 | Disposition: A | Discharge status: Home / Self Care | Payer: Federal, State, Local not specified - PPO | Source: Ambulatory Visit | Attending: Cardiology | Admitting: Cardiology

## 2023-04-12 ENCOUNTER — Telehealth (HOSPITAL_COMMUNITY): Payer: Self-pay | Admitting: Cardiology

## 2023-04-12 VITALS — BP 139/98 | HR 88 | Wt 355.8 lb

## 2023-04-12 DIAGNOSIS — E785 Hyperlipidemia, unspecified: Secondary | ICD-10-CM

## 2023-04-12 DIAGNOSIS — E1169 Type 2 diabetes mellitus with other specified complication: Secondary | ICD-10-CM

## 2023-04-12 DIAGNOSIS — N1831 Chronic kidney disease, stage 3a: Secondary | ICD-10-CM

## 2023-04-12 DIAGNOSIS — I5020 Unspecified systolic (congestive) heart failure: Secondary | ICD-10-CM

## 2023-04-12 DIAGNOSIS — Z7984 Long term (current) use of oral hypoglycemic drugs: Secondary | ICD-10-CM

## 2023-04-12 LAB — BASIC METABOLIC PANEL
Anion gap: 7 (ref 5–15)
BUN: 22 mg/dL — ABNORMAL HIGH (ref 6–20)
CO2: 25 mmol/L (ref 22–32)
Calcium: 9.1 mg/dL (ref 8.9–10.3)
Chloride: 103 mmol/L (ref 98–111)
Creatinine, Ser: 1.82 mg/dL — ABNORMAL HIGH (ref 0.61–1.24)
GFR, Estimated: 47 mL/min — ABNORMAL LOW (ref >60)
Glucose, Bld: 116 mg/dL — ABNORMAL HIGH (ref 70–99)
Potassium: 3.9 mmol/L (ref 3.5–5.1)
Sodium: 135 mmol/L (ref 135–145)

## 2023-04-12 LAB — TSH: TSH: 1.855 u[IU]/mL (ref 0.350–4.500)

## 2023-04-12 LAB — BRAIN NATRIURETIC PEPTIDE: B Natriuretic Peptide: 12 pg/mL (ref 0.0–100.0)

## 2023-04-12 MED ORDER — TORSEMIDE 20 MG PO TABS: 10.0000 mg | ORAL_TABLET | Freq: Every day | ORAL | 3 refills | Status: DC

## 2023-04-12 NOTE — Progress Notes (Signed)
ADVANCED HEART FAILURE CLINIC NOTE  Referring Physician: Dan Maker, MD  Primary Care: Dan Maker, MD   HPI: Jordan Greer is a 42 y.o. male with HFrEF 2/2 NICM, HTN, OSA on CPAP, T2DM, and obesity presenting today to establish care. Jordan Greer' cardiac history dates back to February 2024 when he was admitted with acute onset shortness of breath.  During that admission he was found to be significantly hypertensive requiring multiple antihypertensive medications for control.  In addition he underwent right and left heart cath with which demonstrated preserved cardiac index and no significant coronary artery disease.  He had an echocardiogram with LVEF of 35%.  Patient was discharged home on GDMT. 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   Interval history: Doing well since his last appointment. No longer becoming SOB at work. He feels that his CPAP is no longer helping him; last evaluated >10 years ago by sleep medicine. Compliant with all his medications; however, did not take Entresto or coreg this AM.   Activity level/exercise tolerance: NYHA II Orthopnea:  Sleeps on 2 pillows Paroxysmal noctural dyspnea: No Chest pain/pressure: No Orthostatic lightheadedness:  No Palpitations:  No Lower extremity edema:  No Presyncope/syncope:  No Cough:  No  Past Medical History:  Diagnosis Date   CHF (congestive heart failure) (HCC)    Colitis    Colitis    Diabetes mellitus without complication (HCC)    Hyperlipidemia    Hypertension    Obstructive sleep apnea     Current Outpatient Medications  Medication Sig Dispense Refill   aspirin EC 81 MG tablet Take 1 tablet (81 mg total) by mouth daily. Swallow whole. 30 tablet 12   carvedilol (COREG) 25 MG tablet Take 1 tablet (25 mg total) by mouth 2 (two) times daily with a meal. 60 tablet 3   dapagliflozin propanediol (FARXIGA) 10 MG TABS tablet Take 1 tablet (10 mg total) by mouth daily. 30 tablet 2    glimepiride (AMARYL) 4 MG tablet Take 4 mg by mouth in the morning and at bedtime.     glipiZIDE (GLUCOTROL XL) 10 MG 24 hr tablet Take 10 mg by mouth 2 (two) times daily.      hydrALAZINE (APRESOLINE) 25 MG tablet Take 1.5 tablets (37.5 mg total) by mouth every 8 (eight) hours. 90 tablet 0   isosorbide mononitrate (IMDUR) 30 MG 24 hr tablet Take 1 tablet (30 mg total) by mouth daily. 30 tablet 0   metFORMIN (GLUCOPHAGE-XR) 500 MG 24 hr tablet Take 1,000 mg by mouth 2 (two) times daily.     OZEMPIC, 2 MG/DOSE, 8 MG/3ML SOPN Inject 2 mg into the skin once a week.     rosuvastatin (CRESTOR) 20 MG tablet Take 1 tablet (20 mg total) by mouth at bedtime. Please restart after finishing Paxlovid     sacubitril-valsartan (ENTRESTO) 97-103 MG Take 1 tablet by mouth 2 (two) times daily. 60 tablet 1   spironolactone (ALDACTONE) 25 MG tablet Take 1 tablet (25 mg total) by mouth daily. 30 tablet 1   zinc sulfate 220 (50 Zn) MG capsule Take 1 capsule (220 mg total) by mouth daily. 90 capsule 0   albuterol (VENTOLIN HFA) 108 (90 Base) MCG/ACT inhaler Inhale 2 puffs into the lungs every 6 (six) hours as needed for shortness of breath or wheezing. (Patient not taking: Reported on 01/07/2023)     Ipratropium-Albuterol (COMBIVENT) 20-100 MCG/ACT AERS respimat Inhale 1 puff into the lungs  every 6 (six) hours. (Patient not taking: Reported on 01/07/2023) 4 g 0   torsemide (DEMADEX) 20 MG tablet Take 1 tablet (20 mg total) by mouth daily. 90 tablet 0   vitamin C (VITAMIN C) 500 MG tablet Take 1 tablet (500 mg total) by mouth daily. (Patient not taking: Reported on 02/07/2023) 30 tablet 0   No current facility-administered medications for this visit.    Allergies  Allergen Reactions   Fish Allergy Itching and Nausea And Vomiting      Social History   Socioeconomic History   Marital status: Married    Spouse name: Not on file   Number of children: Not on file   Years of education: Not on file   Highest education  level: Not on file  Occupational History   Not on file  Tobacco Use   Smoking status: Never   Smokeless tobacco: Never  Substance and Sexual Activity   Alcohol use: Yes    Alcohol/week: 18.0 standard drinks of alcohol    Types: 18 Cans of beer per week   Drug use: No   Sexual activity: Yes  Other Topics Concern   Not on file  Social History Narrative   Not on file   Social Determinants of Health   Financial Resource Strain: Not on file  Food Insecurity: No Food Insecurity (12/24/2022)   Hunger Vital Sign    Worried About Running Out of Food in the Last Year: Never true    Ran Out of Food in the Last Year: Never true  Transportation Needs: No Transportation Needs (12/24/2022)   PRAPARE - Administrator, Civil Service (Medical): No    Lack of Transportation (Non-Medical): No  Physical Activity: Not on file  Stress: Not on file  Social Connections: Not on file  Intimate Partner Violence: Not At Risk (12/24/2022)   Humiliation, Afraid, Rape, and Kick questionnaire    Fear of Current or Ex-Partner: No    Emotionally Abused: No    Physically Abused: No    Sexually Abused: No      Family History  Problem Relation Age of Onset   Hypertension Mother    Diabetes Mellitus II Mother     PHYSICAL EXAM: Vitals:   04/12/23 1001  BP: (!) 139/98  Pulse: 88  SpO2: 99%   GENERAL: Well nourished, well developed, and in no apparent distress at rest.  HEENT: Negative for arcus senilis or xanthelasma. There is no scleral icterus.  The mucous membranes are pink and moist.   NECK: Supple, No masses. Normal carotid upstrokes without bruits. No masses or thyromegaly.    CHEST: There are no chest wall deformities. There is no chest wall tenderness. Respirations are unlabored.  Lungs- CTA B/L CARDIAC:  JVP: 7 cm          Normal rate with regular rhythm. No murmurs, rubs or gallops.  Pulses are 2+ and symmetrical in upper and lower extremities. no edema.  ABDOMEN: Soft, non-tender,  non-distended. There are no masses or hepatomegaly. There are normal bowel sounds.  EXTREMITIES: Warm and well perfused with no cyanosis, clubbing.  LYMPHATIC: No axillary or supraclavicular lymphadenopathy.  NEUROLOGIC: Patient is oriented x3 with no focal or lateralizing neurologic deficits.  PSYCH: Patients affect is appropriate, there is no evidence of anxiety or depression.  SKIN: Warm and dry; no lesions or wounds.    DATA REVIEW  ECG: 01/07/2023: Normal sinus rhythm as per my personal interpretation  ECHO: LVEF 35%, moderate LVH,  normal RV function as per my personal interpretation  CATH: 12/25/22: No angiographically significant coronary artery disease.  Findings are consistent with nonischemic cardiomyopathy. Moderately elevated left heart, right heart, and pulmonary artery pressures. Normal Fick cardiac output/index.   ASSESSMENT & PLAN:  Heart failure with reduced ejection fraction Etiology of HF: Likely hypertensive cardiomyopathy. TSH today. Repeat TTE at next apointment.  NYHA class / AHA Stage:IIB Volume status & Diuretics: Euvolemic, taking torsemide 20mg . Vasodilators:Entresto 97/103mg  BID, hydralazine 1.5 tabs TID, imdur 30mg  daily. Hypertensive this AM, however, has not taken Entresto or coreg yet.  Beta-Blocker: Coreg 25 mg daily MRA: Spironolactone 25 mg Cardiometabolic: Farxiga 10 mg daily Devices therapies & Valvulopathies: Not currently indicated Advanced therapies: Not currently indicated  2. Hypertension  -Now on Entresto 97/103 mg twice daily.  3. T2DM -Well-controlled.  Hemoglobin A1c of 6 in February 2024.  Now on Farxiga 10 mg.  4. Obesity -Body mass index is 52.54 kg/m. -Started on Ozempic several months ago -discussed weight loss once again today.   5. CKD -Continue Marcelline Deist - repeat labs today.   6. Sleep apnea - using CPAP - refer to sleep medicine  7. Hyperlipidemia - LDL 60, HDL 36 on 12/24/22 - Repeat in 8m - continue crestor  20mg  daily  Greater than 45 minutes spent with patient reviewing imaging, discussing medication compliance and importance of weight loss  Wai Minotti Advanced Heart Failure Mechanical Circulatory Support

## 2023-04-12 NOTE — Telephone Encounter (Signed)
-----   Message from [place], DO sent at 04/12/2023  4:51 PM EDT ----- Can we please ask Mr. Sunde to reduce torsemide to 10mg  daily. He can take additional 20mg  for weight gain or edema.   Adi

## 2023-04-12 NOTE — Telephone Encounter (Signed)
PT AWARE V

## 2023-04-12 NOTE — Patient Instructions (Signed)
Medication Changes:  No medications changes.  Lab Work:  Labs done today, your results will be available in MyChart, we will contact you for abnormal readings.   Testing/Procedures:  Your physician has requested that you have an echocardiogram. Echocardiography is a painless test that uses sound waves to create images of your heart. It provides your doctor with information about the size and shape of your heart and how well your heart's chambers and valves are working. This procedure takes approximately one hour. There are no restrictions for this procedure. Please do NOT wear cologne, perfume, aftershave, or lotions (deodorant is allowed). Please arrive 15 minutes prior to your appointment time.   Referrals:  You have been referred to sleep study testing. You will receive a call from their clinic to schedule your appointment.   Special Instructions // Education:  Do the following things EVERYDAY: Weigh yourself in the morning before breakfast. Write it down and keep it in a log. Take your medicines as prescribed Eat low salt foods--Limit salt (sodium) to 2000 mg per day.  Stay as active as you can everyday Limit all fluids for the day to less than 2 liters   Follow-Up in: follow up in 3 months with Dr. Gasper Lloyd.     If you have any questions or concerns before your next appointment please send [place] a message through [facility] or call our office at [phone] Monday-Friday 8 am-5 pm.   If you have an urgent need after hours on the weekend please call your Primary Cardiologist or the Advanced Heart Failure Clinic in [place] at [phone].

## 2023-05-06 ENCOUNTER — Ambulatory Visit: Payer: Federal, State, Local not specified - PPO

## 2023-05-10 ENCOUNTER — Ambulatory Visit
Admission: RE | Admit: 2023-05-10 | Discharge: 2023-05-10 | Disposition: A | Discharge status: Home / Self Care | Payer: Federal, State, Local not specified - PPO | Source: Ambulatory Visit | Attending: Cardiology | Admitting: Cardiology

## 2023-05-10 DIAGNOSIS — E1122 Type 2 diabetes mellitus with diabetic chronic kidney disease: Secondary | ICD-10-CM | POA: Diagnosis not present

## 2023-05-10 DIAGNOSIS — E669 Obesity, unspecified: Secondary | ICD-10-CM | POA: Diagnosis not present

## 2023-05-10 DIAGNOSIS — I130 Hypertensive heart and chronic kidney disease with heart failure and stage 1 through stage 4 chronic kidney disease, or unspecified chronic kidney disease: Secondary | ICD-10-CM | POA: Diagnosis not present

## 2023-05-10 DIAGNOSIS — G4730 Sleep apnea, unspecified: Secondary | ICD-10-CM | POA: Diagnosis not present

## 2023-05-10 DIAGNOSIS — N189 Chronic kidney disease, unspecified: Secondary | ICD-10-CM | POA: Diagnosis not present

## 2023-05-10 DIAGNOSIS — I5020 Unspecified systolic (congestive) heart failure: Secondary | ICD-10-CM | POA: Diagnosis not present

## 2023-05-10 DIAGNOSIS — E785 Hyperlipidemia, unspecified: Secondary | ICD-10-CM | POA: Diagnosis not present

## 2023-05-10 LAB — ECHOCARDIOGRAM COMPLETE
AR max vel: 2.62 cm2
AV Area VTI: 2.89 cm2
AV Area mean vel: 2.6 cm2
AV Mean grad: 3 mmHg
AV Peak grad: 5.6 mmHg
Ao pk vel: 1.18 m/s
Area-P 1/2: 6.12 cm2
Calc EF: 54.3 %
MV VTI: 3.21 cm2
S' Lateral: 3.5 cm
Single Plane A2C EF: 42.7 %
Single Plane A4C EF: 57.5 %

## 2023-05-10 MED ORDER — PERFLUTREN LIPID MICROSPHERE
1.0000 mL | INTRAVENOUS | Status: AC | PRN
  Administered 2023-05-10: 5 mL via INTRAVENOUS

## 2023-05-10 NOTE — Progress Notes (Signed)
*  PRELIMINARY RESULTS* Echocardiogram 2D Echocardiogram has been performed.  Jordan Greer 05/10/2023, 11:09 AM

## 2023-06-13 ENCOUNTER — Encounter: Payer: Federal, State, Local not specified - PPO | Admitting: Cardiology

## 2023-06-26 ENCOUNTER — Encounter: Payer: Federal, State, Local not specified - PPO | Admitting: Cardiology

## 2023-06-27 ENCOUNTER — Ambulatory Visit: Payer: Federal, State, Local not specified - PPO | Admitting: Cardiology

## 2023-06-27 NOTE — Progress Notes (Signed)
PCP: Craig Staggers, MD (last seen 10/23) Primary Cardiologist: Terrilee Croak, PA (last seen 02/24) HF cardiology: Dorthula Nettles, DO, (last seen 05/24)  HPI:  Jordan Greer is a 42 y/o male with a history of DM, hyperlipidemia, HTN, OSA and chronic heart failure.   Admitted 12/23/22 due to SOB due to HF and covid. CTA negative for PE.   Echo 12/24/22: EF of 35-40% along with moderate LVH along with mildly elevated PA pressure and moderate LAE. Echo 05/10/23: EF 55-60% along with mild LVH, Grade I DD and normal PA pressure of 21.5 mmHg   RHC/LHC 12/25/22: No angiographically significant coronary artery disease.  Findings are consistent with nonischemic cardiomyopathy. Moderately elevated left heart, right heart, and pulmonary artery pressures. Normal Fick cardiac output/index.  He presents today for a HF f/u visit with a chief complaint of minimal shortness of breath with exertion. Has occasional intermittent palpitations, fatigue, intermittent pedal edema, gradual weight gain and difficulty sleeping along with this. Denies chest pain, cough, abdominal distention or dizziness.   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 Is asking for a letter stating that he can only work 40 hours/ week as he says that the stress of his job is just too much on his body when working double shifts. Says that when this occurs, he feels extremely fatigued.    Was supposed to have his appointment at the sleep center yesterday but cancelled it due to the weather and has to r/s this.   Not adding salt but does eat out often. Tends to eat breakfast at fast food places and also ate pork yesterday.   Says that he took all his BP/ heart meds this morning but when asking in more detail, there are discrepancies. Only taking hydralazine BID instead of TID and doesn't think he's been taking isosorbide at all. Has been taking entresto in the morning with  the second dose around lunchtime. Does admit that with all the medications he is taking that it gets confusing.  [facility] was called and says that his carvedilol was last filled 05/24, imdur filled 02/24, entresto filled 03/24 and hydralazine last filled 05/24. Patient says that he has carvedilol, entresto and hydralazine at home that he's been taking. If he's working double shifts, he may be missing doses as well.   ROS: All systems negative except as listed in HPI, PMH and Problem List.  SH:  Social History   Socioeconomic History   Marital status: Married    Spouse name: Not on file   Number of children: Not on file   Years of education: Not on file   Highest education level: Not on file  Occupational History   Not on file  Tobacco Use   Smoking status: Never   Smokeless tobacco: Never  Substance and Sexual Activity   Alcohol use: Yes    Alcohol/week: 18.0 standard drinks of alcohol    Types: 18 Cans of beer per week   Drug use: No   Sexual activity: Yes  Other Topics Concern   Not on file  Social History Narrative   Not on file   Social Determinants of Health   Financial Resource Strain: Not on file  Food Insecurity: No Food Insecurity (12/24/2022)   Hunger Vital Sign    Worried About Running Out of Food in the Last Year: Never true    Ran Out of Food in the Last Year: Never true  Transportation Needs: No Transportation Needs (12/24/2022)   PRAPARE - Administrator, Civil Service (Medical): No    Lack of Transportation (Non-Medical): No  Physical Activity: Not on file  Stress: Not on file  Social Connections: Unknown (03/18/2022)   [facility], [facility]   Social Network    Social Network: Not on file  Intimate Partner Violence: Not At Risk (12/24/2022)   Humiliation, Afraid, Rape, and Kick questionnaire    Fear of Current or Ex-Partner: No    Emotionally Abused: No    Physically Abused: No    Sexually Abused: No    FH:  Family  History  Problem Relation Age of Onset   Hypertension Mother    Diabetes Mellitus II Mother     Past Medical History:  Diagnosis Date   CHF (congestive heart failure) (HCC)    Colitis    Colitis    Diabetes mellitus without complication (HCC)    Hyperlipidemia    Hypertension    Obstructive sleep apnea     Current Outpatient Medications  Medication Sig Dispense Refill   albuterol (VENTOLIN HFA) 108 (90 Base) MCG/ACT inhaler Inhale 2 puffs into the lungs every 6 (six) hours as needed for shortness of breath or wheezing. (Patient not taking: Reported on 01/07/2023)     aspirin EC 81 MG tablet Take 1 tablet (81 mg total) by mouth daily. Swallow whole. 30 tablet 12   carvedilol (COREG) 25 MG tablet Take 1 tablet (25 mg total) by mouth 2 (two) times daily with a meal. 60 tablet 3   dapagliflozin propanediol (FARXIGA) 10 MG TABS tablet Take 1 tablet (10 mg total) by mouth daily. 30 tablet 2   glimepiride (AMARYL) 4 MG tablet Take 4 mg by mouth in the morning and at bedtime.     glipiZIDE (GLUCOTROL XL) 10 MG 24 hr tablet Take 10 mg by mouth 2 (two) times daily.      hydrALAZINE (APRESOLINE) 25 MG tablet Take 1.5 tablets (37.5 mg total) by mouth every 8 (eight) hours. 90 tablet 0   Ipratropium-Albuterol (COMBIVENT) 20-100 MCG/ACT AERS respimat Inhale 1 puff into the lungs every 6 (six) hours. (Patient not taking: Reported on 01/07/2023) 4 g 0   isosorbide mononitrate (IMDUR) 30 MG 24 hr tablet Take 1 tablet (30 mg total) by mouth daily. 30 tablet 0   metFORMIN (GLUCOPHAGE-XR) 500 MG 24 hr tablet Take 1,000 mg by mouth 2 (two) times daily.     OZEMPIC, 2 MG/DOSE, 8 MG/3ML SOPN Inject 2 mg into the skin once a week.     rosuvastatin (CRESTOR) 20 MG tablet Take 1 tablet (20 mg total) by mouth at bedtime. Please restart after finishing Paxlovid     sacubitril-valsartan (ENTRESTO) 97-103 MG Take 1 tablet by mouth 2 (two) times daily. 60 tablet 1   spironolactone (ALDACTONE) 25 MG tablet Take 1  tablet (25 mg total) by mouth daily. 30 tablet 1   torsemide (DEMADEX) 20 MG tablet Take 0.5 tablets (10 mg total) by mouth daily. May take 20 mg as needed for weight gain or SOB 45 tablet 3   vitamin C (VITAMIN C) 500 MG tablet Take 1 tablet (500 mg total) by mouth daily. (Patient not taking: Reported on 02/07/2023) 30 tablet 0   zinc sulfate 220 (50 Zn) MG capsule Take 1 capsule (220 mg total) by mouth daily. 90 capsule 0   No current facility-administered medications for this visit.   Vitals:   06/28/23  0947  BP: (!) 170/100  Pulse: 90  SpO2: 99%  Weight: (!) 368 lb (166.9 kg)   Wt Readings from Last 3 Encounters:  06/28/23 (!) 368 lb (166.9 kg)  04/12/23 (!) 355 lb 12.8 oz (161.4 kg)  02/07/23 (!) 357 lb (161.9 kg)   Lab Results  Component Value Date   CREATININE 1.82 (H) 04/12/2023   CREATININE 1.71 (H) 02/07/2023   CREATININE 1.84 (H) 01/21/2023   PHYSICAL EXAM:  General:  Well appearing. No resp difficulty HEENT: normal Neck: supple. JVP flat but difficult to assess due to neck size. No lymphadenopathy or thryomegaly appreciated. Cor: PMI normal. Regular rate & rhythm. No rubs, gallops or murmurs. Lungs: clear Abdomen: soft, nontender, nondistended. No hepatosplenomegaly. No bruits or masses.  Extremities: no cyanosis, clubbing, rash, edema Neuro: alert & oriented x3, cranial nerves grossly intact. Moves all 4 extremities w/o difficulty. Affect pleasant.   ECG: not done   ASSESSMENT & PLAN:  1: Chronic heart failure with now preserved ejection fraction- - lkely due to HTN/ OSA - NYHA class II - euvolemic - weighing daily; reminded to call for overnight weight gain of > 2 pounds or a weekly weight gain of > 5 pounds - weight up 13 pounds from last visit here 3 months ago - echo 12/24/22: EF of 35-40% along with moderate LVH along with mildly elevated PA pressure and moderate LAE.  - Echo 05/10/23: EF 55-60% along with mild LVH, Grade I DD and normal PA pressure of  21.5 mmHg - RHC/LHC 12/25/22 showed no angiographically significant coronary artery disease.  Findings are consistent with nonischemic cardiomyopathy. Moderately elevated left heart, right heart, and pulmonary artery pressures. Normal Fick cardiac output/index. - reviewed diet regarding sodium intake with eating out; he is aware that when he eats at fast food that's he's getting more salt but says that with his schedule, it's sometimes easier - continue carvedilol 25mg  BID - continue farxiga 10mg  daily - continue entresto 97/103mg  BID - continue spironolactone 25mg  daily - continue torsemide 10mg  daily - resume hydralazine 37.5mg  TID (was taking this BID)/ resume isosorbide 30mg  daily - saw cardiology Fransico Michael) 02/24 - BNP 04/12/23 was 12.0 - reviewed echo results with patient and spent a lot of time discussing his diet and medication regimen & the importance of consistently taking all his meds so that his heart function can maintain; also emphasized that when something runs out of refills, he needs to let us know - PharmD also talked with patient and patient would like his medications sent to the [facility] for mail order option  2: HTN- - BP 170/100; resuming meds per above - saw PCP Meredeth Ide) 10/23 - BMP 04/12/23 showed sodium 135, potassium 3.9, creatinine 1.82 & GFR 47 - BMP today - letter provided to him today for his employer to not work more than 40 hours/ week  3: DM- - A1c 12/24/22 was 6.0%; recheck today - continue glipizide 10mg  BID - continue metformin XR 100mg  BID - continue farxiga 10mg  daily - continue ozempic 2mg  weekly per PCP  4: Hyperlipidemia- - LDL 12/24/22 was 60 - rosuvastatin 20mg  daily  5: OSA- - wears CPAP nightly but says that his machine is at least 44 years old and he doesn't think it's working properly anymore - had sleep center appointment yesterday that he cancelled due to the weather and has to call and r/s it  Return in 2 weeks to see PharmD to  continue to assist with medication compliance, affordability. Marland Kitchen

## 2023-06-28 ENCOUNTER — Other Ambulatory Visit (HOSPITAL_COMMUNITY): Payer: Self-pay

## 2023-06-28 ENCOUNTER — Encounter: Payer: Self-pay | Admitting: Family

## 2023-06-28 ENCOUNTER — Ambulatory Visit: Payer: Federal, State, Local not specified - PPO | Admitting: Family

## 2023-06-28 ENCOUNTER — Other Ambulatory Visit
Admission: RE | Admit: 2023-06-28 | Discharge: 2023-06-28 | Disposition: A | Discharge status: Home / Self Care | Payer: Federal, State, Local not specified - PPO | Source: Ambulatory Visit | Attending: Family | Admitting: Family

## 2023-06-28 ENCOUNTER — Telehealth: Payer: Self-pay | Admitting: Pharmacist

## 2023-06-28 VITALS — BP 170/100 | HR 90 | Wt 368.0 lb

## 2023-06-28 DIAGNOSIS — Z760 Encounter for issue of repeat prescription: Secondary | ICD-10-CM | POA: Insufficient documentation

## 2023-06-28 DIAGNOSIS — Z7984 Long term (current) use of oral hypoglycemic drugs: Secondary | ICD-10-CM | POA: Insufficient documentation

## 2023-06-28 DIAGNOSIS — E1169 Type 2 diabetes mellitus with other specified complication: Secondary | ICD-10-CM

## 2023-06-28 DIAGNOSIS — I110 Hypertensive heart disease with heart failure: Secondary | ICD-10-CM | POA: Insufficient documentation

## 2023-06-28 DIAGNOSIS — I10 Essential (primary) hypertension: Secondary | ICD-10-CM | POA: Diagnosis not present

## 2023-06-28 DIAGNOSIS — Z79899 Other long term (current) drug therapy: Secondary | ICD-10-CM | POA: Insufficient documentation

## 2023-06-28 DIAGNOSIS — E785 Hyperlipidemia, unspecified: Secondary | ICD-10-CM | POA: Diagnosis not present

## 2023-06-28 DIAGNOSIS — I5032 Chronic diastolic (congestive) heart failure: Secondary | ICD-10-CM | POA: Insufficient documentation

## 2023-06-28 DIAGNOSIS — G4733 Obstructive sleep apnea (adult) (pediatric): Secondary | ICD-10-CM | POA: Insufficient documentation

## 2023-06-28 DIAGNOSIS — E119 Type 2 diabetes mellitus without complications: Secondary | ICD-10-CM | POA: Diagnosis not present

## 2023-06-28 DIAGNOSIS — I509 Heart failure, unspecified: Secondary | ICD-10-CM | POA: Diagnosis not present

## 2023-06-28 LAB — BASIC METABOLIC PANEL
Anion gap: 7 (ref 5–15)
BUN: 15 mg/dL (ref 6–20)
CO2: 26 mmol/L (ref 22–32)
Calcium: 8.7 mg/dL — ABNORMAL LOW (ref 8.9–10.3)
Chloride: 104 mmol/L (ref 98–111)
Creatinine, Ser: 1.51 mg/dL — ABNORMAL HIGH (ref 0.61–1.24)
GFR, Estimated: 58 mL/min — ABNORMAL LOW (ref >60)
Glucose, Bld: 139 mg/dL — ABNORMAL HIGH (ref 70–99)
Potassium: 4 mmol/L (ref 3.5–5.1)
Sodium: 137 mmol/L (ref 135–145)

## 2023-06-28 LAB — HEMOGLOBIN A1C
Hgb A1c MFr Bld: 6.8 % — ABNORMAL HIGH (ref 4.8–5.6)
Mean Plasma Glucose: 148.46 mg/dL

## 2023-06-28 MED ORDER — ISOSORBIDE MONONITRATE ER 30 MG PO TB24
30.0000 mg | ORAL_TABLET | Freq: Every day | ORAL | 0 refills | Status: DC
  Filled 2023-06-28 (×2): qty 30, 30d supply, fill #0

## 2023-06-28 MED ORDER — SACUBITRIL-VALSARTAN 97-103 MG PO TABS
1.0000 | ORAL_TABLET | Freq: Two times a day (BID) | ORAL | 1 refills | Status: DC
  Filled 2023-06-28 (×2): qty 60, 30d supply, fill #0
  Filled 2023-08-13: qty 60, 30d supply, fill #1

## 2023-06-28 MED ORDER — HYDRALAZINE HCL 25 MG PO TABS: 37.5000 mg | ORAL_TABLET | Freq: Three times a day (TID) | ORAL | Status: DC

## 2023-06-28 MED ORDER — DAPAGLIFLOZIN PROPANEDIOL 10 MG PO TABS
10.0000 mg | ORAL_TABLET | Freq: Every day | ORAL | 2 refills | Status: DC
  Filled 2023-06-28 (×2): qty 30, 30d supply, fill #0
  Filled 2023-08-13: qty 30, 30d supply, fill #1
  Filled 2023-09-13: qty 30, 30d supply, fill #2

## 2023-06-28 MED ORDER — SPIRONOLACTONE 25 MG PO TABS
25.0000 mg | ORAL_TABLET | Freq: Every day | ORAL | 1 refills | Status: DC
  Filled 2023-06-28 (×2): qty 30, 30d supply, fill #0
  Filled 2023-08-13: qty 30, 30d supply, fill #1

## 2023-06-28 MED ORDER — CARVEDILOL 25 MG PO TABS
25.0000 mg | ORAL_TABLET | Freq: Two times a day (BID) | ORAL | 3 refills | Status: DC
  Filled 2023-06-28 (×2): qty 60, 30d supply, fill #0
  Filled 2023-08-13: qty 60, 30d supply, fill #1

## 2023-06-28 MED ORDER — TORSEMIDE 20 MG PO TABS
20.0000 mg | ORAL_TABLET | Freq: Every day | ORAL | 3 refills | Status: DC
  Filled 2023-06-28 (×2): qty 90, 90d supply, fill #0

## 2023-06-28 NOTE — Telephone Encounter (Signed)
Patient has struggled with medication adherence. Would prefer to receive medications mailed to him. Have arranged [facility] mail order for HF medications.

## 2023-06-28 NOTE — Patient Instructions (Addendum)
Go downstairs to LOWER LEVEL inside of [facility] and get your labs drawn at the [facility].   Your heart/ blood pressure medications are being sent to the pharmacy and they will be mailing them to you. Your primary care provider can send your other medications to the pharmacy to get them mailed in.   Please take your hydralazine three times daily.   You need to take your entresto/ carvedilol twice daily. Preferably in the morning and then again in the evening. (About 12 hours apart).

## 2023-06-29 ENCOUNTER — Other Ambulatory Visit (HOSPITAL_COMMUNITY): Payer: Self-pay

## 2023-06-29 MED ORDER — TORSEMIDE 20 MG PO TABS: 10.0000 mg | ORAL_TABLET | Freq: Every day | ORAL | 3 refills | Status: DC

## 2023-06-29 MED ORDER — FREESTYLE LIBRE 2 SENSOR MISC
11 refills | Status: AC
  Filled 2023-06-29: qty 2, 28d supply, fill #0
  Filled 2023-08-13: qty 2, 28d supply, fill #1

## 2023-06-29 MED ORDER — OLMESARTAN MEDOXOMIL 40 MG PO TABS
40.0000 mg | ORAL_TABLET | Freq: Every day | ORAL | 2 refills | Status: DC
  Filled 2023-06-29: qty 90, 90d supply, fill #0

## 2023-06-29 MED ORDER — SOAANZ 60 MG PO TABS
60.0000 mg | ORAL_TABLET | Freq: Every day | ORAL | 1 refills | Status: DC
  Filled 2023-06-29: qty 30, 30d supply, fill #0

## 2023-06-29 MED ORDER — METFORMIN HCL ER 500 MG PO TB24
2000.0000 mg | ORAL_TABLET | Freq: Every day | ORAL | 1 refills | Status: DC
  Filled 2023-06-29: qty 360, 90d supply, fill #0
  Filled 2023-09-13: qty 360, 90d supply, fill #1

## 2023-06-29 MED ORDER — GLIMEPIRIDE 4 MG PO TABS
4.0000 mg | ORAL_TABLET | Freq: Two times a day (BID) | ORAL | 1 refills | Status: DC
  Filled 2023-09-13: qty 180, 90d supply, fill #0

## 2023-06-29 MED ORDER — DAPAGLIFLOZIN PROPANEDIOL 10 MG PO TABS: 10.0000 mg | ORAL_TABLET | Freq: Every day | ORAL | 2 refills | Status: DC

## 2023-06-29 MED ORDER — TORSEMIDE 20 MG PO TABS: 20.0000 mg | ORAL_TABLET | Freq: Every day | ORAL | 1 refills | Status: DC

## 2023-06-29 MED ORDER — FREESTYLE LIBRE 2 READER DEVI
1 refills | Status: AC
  Filled 2023-08-13: qty 1, 28d supply, fill #0

## 2023-06-29 MED ORDER — HYDRALAZINE HCL 25 MG PO TABS
25.0000 mg | ORAL_TABLET | Freq: Three times a day (TID) | ORAL | 3 refills | Status: DC | PRN
  Filled 2023-06-29: qty 270, 90d supply, fill #0

## 2023-06-29 MED ORDER — OZEMPIC (2 MG/DOSE) 8 MG/3ML ~~LOC~~ SOPN: 2.0000 mg | PEN_INJECTOR | SUBCUTANEOUS | 6 refills | Status: DC

## 2023-06-29 MED ORDER — CARVEDILOL 25 MG PO TABS
25.0000 mg | ORAL_TABLET | Freq: Two times a day (BID) | ORAL | 3 refills | Status: DC
  Filled 2023-06-29: qty 60, 30d supply, fill #0

## 2023-06-29 MED ORDER — SPIRONOLACTONE 25 MG PO TABS
25.0000 mg | ORAL_TABLET | Freq: Every day | ORAL | 1 refills | Status: DC
  Filled 2023-06-29: qty 90, 90d supply, fill #0

## 2023-06-29 MED ORDER — ROSUVASTATIN CALCIUM 20 MG PO TABS
20.0000 mg | ORAL_TABLET | Freq: Every evening | ORAL | 1 refills | Status: DC
  Filled 2023-06-29 – 2023-08-13 (×2): qty 90, 90d supply, fill #0

## 2023-06-29 MED ORDER — FUROSEMIDE 40 MG PO TABS
40.0000 mg | ORAL_TABLET | Freq: Every day | ORAL | 1 refills | Status: DC
  Filled 2023-06-29: qty 90, 90d supply, fill #0

## 2023-06-29 MED ORDER — DAPAGLIFLOZIN PROPANEDIOL 5 MG PO TABS: 5.0000 mg | ORAL_TABLET | Freq: Every day | ORAL | 7 refills | Status: DC

## 2023-06-29 MED ORDER — CARVEDILOL 25 MG PO TABS: 25.0000 mg | ORAL_TABLET | Freq: Two times a day (BID) | ORAL | 1 refills | Status: DC

## 2023-06-29 MED ORDER — AMLODIPINE BESYLATE 10 MG PO TABS
10.0000 mg | ORAL_TABLET | Freq: Every day | ORAL | 1 refills | Status: DC
  Filled 2023-06-29: qty 90, 90d supply, fill #0

## 2023-07-01 ENCOUNTER — Other Ambulatory Visit (HOSPITAL_COMMUNITY): Payer: Self-pay

## 2023-07-01 ENCOUNTER — Other Ambulatory Visit: Payer: Self-pay

## 2023-07-01 DIAGNOSIS — G4733 Obstructive sleep apnea (adult) (pediatric): Secondary | ICD-10-CM | POA: Diagnosis not present

## 2023-07-02 ENCOUNTER — Other Ambulatory Visit (HOSPITAL_COMMUNITY): Payer: Self-pay

## 2023-07-02 ENCOUNTER — Encounter: Payer: Self-pay | Admitting: Pharmacist

## 2023-07-02 ENCOUNTER — Other Ambulatory Visit: Payer: Self-pay

## 2023-07-04 DIAGNOSIS — G4733 Obstructive sleep apnea (adult) (pediatric): Secondary | ICD-10-CM | POA: Diagnosis not present

## 2023-07-04 DIAGNOSIS — N183 Chronic kidney disease, stage 3 unspecified: Secondary | ICD-10-CM | POA: Diagnosis not present

## 2023-07-04 DIAGNOSIS — E1159 Type 2 diabetes mellitus with other circulatory complications: Secondary | ICD-10-CM | POA: Diagnosis not present

## 2023-07-04 DIAGNOSIS — E1122 Type 2 diabetes mellitus with diabetic chronic kidney disease: Secondary | ICD-10-CM | POA: Diagnosis not present

## 2023-07-15 ENCOUNTER — Other Ambulatory Visit (HOSPITAL_COMMUNITY): Payer: Self-pay

## 2023-07-15 NOTE — Progress Notes (Addendum)
Advanced Heart Failure Clinic Note  PCP: Dan Maker, MD  PCP-Cardiologist: None  HF-Cardiologist: Dr. Dorthula Nettles  HPI:  Jordan Greer is a 42 y.o. male with HFrEF 2/2 NICM, HTN, OSA on CPAP, T2DM, and obesity. Jordan Greer' cardiac history dates back to February 2024 when he was admitted with acute onset shortness of breath.  During that admission he was found to be significantly hypertensive requiring multiple medications for BP control. In addition he underwent right and left heart cath with which demonstrated preserved cardiac index and no significant coronary artery disease.  He had an echocardiogram with LVEF of 35%.  Patient was discharged home on GDMT. 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    Seen 04/12/23 in AHF clinic. Reported feeling well. No medication changes made.   Follow-up TTE 04/2023 showed improved LVEF to 55-60%  on GDMT. Also with grade I diastolic dysfunction.  Seen 06/28/23 by Jordan Greer where BP was 170/100 mmHg. Was taking hydralazine BID rather than TID and likely was not taking Imdur and reported missed doses of other medications due to work schedule. Instructed to increase hydralazine back to TID and resume Imdur.  Seen 07/04/23 for sleep test.  Today Jordan Greer returns to Heart Failure Clinic for pharmacist medication titration. Reports feeling ***. {Reports/Denies:210917258}. {ACTIONS;DENIES/REPORTS:21021675::"Denies"} being able to complete all activities of daily living (ADLs). Is *** active throughout the day. Weight at home is *** pounds. Takes {CHL AMB AHFC Medications:210917260} ***. Appetite ***. {Does Follow/Does Not Follow:210917261} a low sodium diet.  Current HF Medications: Loop Diuretic: Torsemide 20 mg daily Beta Blocker: Carvedilol 25 mg BID Angiotensin Receptor/Neprilysin Inhibitor (ARNI): Entresto 97-103 mg BID Mineralocorticoid Receptor Antagonist (MRA): Spironolactone 25 mg daily Sodium-glucose  Cotransporter-2 Inhibitors (SGLT2i): Farxiga 10 mg daily Other: Hydralazine 37.5 mg TID + Isosorbide mononitrate 30 mg daily  Has the patient been experiencing any side effects to the medications prescribed? {yes/no:20286}  Does the patient have any problems obtaining medications due to transportation or finances? {yes/no:20286}  Isosorbide-hydralazine 90 days $22.50  Farxiga 90 days $22.50  Entresto 90 days $944.29 (30 days $327.82) Eligible for copay cards  Understanding of regimen: {CHL AMB AHFC Excellent/Good/Fair/Poor:210917262}  Understanding of indications: {CHL AMB AHFC Excellent/Good/Fair/Poor:210917262}  Potential of adherence: {CHL AMB AHFC Excellent/Good/Fair/Poor:210917262}  Patient understands to avoid NSAIDs.  Patient understands to avoid decongestants.  Pertinent Lab Values: Creatinine, Ser  Date Value Ref Range Status  06/28/2023 1.51 (H) 0.61 - 1.24 mg/dL Final   BUN  Date Value Ref Range Status  06/28/2023 15 6 - 20 mg/dL Final   Potassium  Date Value Ref Range Status  06/28/2023 4.0 3.5 - 5.1 mmol/L Final   Sodium  Date Value Ref Range Status  06/28/2023 137 135 - 145 mmol/L Final   B Natriuretic Peptide  Date Value Ref Range Status  04/12/2023 12.0 0.0 - 100.0 pg/mL Final    Comment:    Performed at [facility], [address] [place], [place] [address]   Magnesium  Date Value Ref Range Status  12/24/2022 2.1 1.7 - 2.4 mg/dL Final    Comment:    Performed at [facility], [address] [place], [place] [address]   TSH  Date Value Ref Range Status  04/12/2023 1.855 0.350 - 4.500 uIU/mL Final    Comment:    Performed by a 3rd Generation assay with a functional sensitivity of <=0.01 uIU/mL. Performed at [facility], [address], [place], [place]  [address]     Vital Signs: There were no vitals filed for this visit.  Assessment/Plan: Heart failure with reduced ejection fraction Etiology of HF:  Likely hypertensive cardiomyopathy. Repeat TTE showed improvement in LVEF from 35-40% to 55-60%. NYHA class / AHA Stage:IIB Volume status & Diuretics: Euvolemic, taking torsemide 20mg  daily. Vasodilators:Entresto 97/103mg  BID, hydralazine 37.5 tabs TID, imdur 30mg  daily. Hypertensive this AM, however, has not taken Entresto or coreg yet.  Beta-Blocker: Coreg 25 mg daily MRA: Spironolactone 25 mg Cardiometabolic: Farxiga 10 mg daily Devices therapies & Valvulopathies: Not currently indicated Advanced therapies: Not currently indicated   2. Hypertension  -Now on Entresto 97/103 mg twice daily.   3. T2DM -Well-controlled.  Hemoglobin A1c of 6 in February 2024.  Now on Farxiga 10 mg.   4. Obesity -Body mass index is 52.54 kg/m. -Started on Ozempic several months ago -discussed weight loss once again today.    5. CKD -Continue Farxiga    6. Sleep apnea - using CPAP - refer to sleep medicine   7. Hyperlipidemia - LDL 60, HDL 36 on 12/24/22 - Repeat in 91m - continue crestor 20mg  daily  Follow up: ***  Thank you for involving pharmacy in this patient's care.  Enos Fling, PharmD, BCPS Phone - [phone] Clinical Pharmacist 07/15/2023 12:50 PM

## 2023-07-18 ENCOUNTER — Encounter: Payer: Self-pay | Admitting: Family

## 2023-07-18 ENCOUNTER — Ambulatory Visit: Payer: Federal, State, Local not specified - PPO | Attending: Internal Medicine | Admitting: Pharmacist

## 2023-07-18 ENCOUNTER — Other Ambulatory Visit (HOSPITAL_COMMUNITY): Payer: Self-pay

## 2023-07-18 VITALS — BP 122/84 | HR 96 | Wt 358.6 lb

## 2023-07-18 DIAGNOSIS — I5032 Chronic diastolic (congestive) heart failure: Secondary | ICD-10-CM

## 2023-07-18 DIAGNOSIS — I5020 Unspecified systolic (congestive) heart failure: Secondary | ICD-10-CM

## 2023-07-18 MED ORDER — ISOSORB DINITRATE-HYDRALAZINE 20-37.5 MG PO TABS
1.0000 | ORAL_TABLET | Freq: Three times a day (TID) | ORAL | 11 refills | Status: DC
  Filled 2023-07-18: qty 90, 30d supply, fill #0
  Filled 2023-08-13: qty 90, 30d supply, fill #1

## 2023-07-18 MED ORDER — TORSEMIDE 20 MG PO TABS
20.0000 mg | ORAL_TABLET | Freq: Every day | ORAL | 3 refills | Status: DC | PRN
  Filled 2023-07-18: qty 90, fill #0
  Filled 2023-08-13 – 2023-09-13 (×2): qty 90, 90d supply, fill #0
  Filled 2023-09-23 – 2023-12-11 (×4): qty 90, 90d supply, fill #1

## 2023-07-18 NOTE — Patient Instructions (Addendum)
It was a pleasure seeing you today!  MEDICATIONS: -We are changing your medications today -Stop taking hydralazine and isosorbide in separate pills. -Start taking BiDil (hydralazine/isosorbide) 37.5-20 mg one tablet three times daily -Change torsemide to as needed -Call if you have questions about your medications.  LABS: -We will call you if your labs need attention.  NEXT APPOINTMENT: Return to clinic in one month with pharmacy.  In general, to take care of your heart failure: -Limit your fluid intake to 2 Liters (half-gallon) per day.   -Limit your salt intake to ideally 2-3 grams (2000-3000 mg) per day. -Weigh yourself daily and record, and bring that "weight diary" to your next appointment.  (Weight gain of 2-3 pounds in 1 day typically means fluid weight.) -The medications for your heart are to help your heart and help you live longer.   -Please contact us before stopping any of your heart medications.  Call the clinic at [phone] with questions or to reschedule future appointments.

## 2023-07-19 ENCOUNTER — Other Ambulatory Visit (HOSPITAL_COMMUNITY): Payer: Self-pay

## 2023-07-19 ENCOUNTER — Other Ambulatory Visit: Payer: Self-pay

## 2023-07-25 DIAGNOSIS — I509 Heart failure, unspecified: Secondary | ICD-10-CM | POA: Diagnosis not present

## 2023-07-25 DIAGNOSIS — E1159 Type 2 diabetes mellitus with other circulatory complications: Secondary | ICD-10-CM | POA: Diagnosis not present

## 2023-08-01 DIAGNOSIS — G4733 Obstructive sleep apnea (adult) (pediatric): Secondary | ICD-10-CM | POA: Diagnosis not present

## 2023-08-09 NOTE — Progress Notes (Signed)
Advanced Heart Failure Clinic Note  PCP: Dan Maker, MD  PCP-Cardiologist: None  HF-Cardiologist: Dr. Dorthula Nettles  HPI:  Kadin Canipe is a 42 y.o. male with HFrEF 2/2 NICM, HTN, OSA on CPAP, T2DM, and obesity. Mr. Ellerman' cardiac history dates back to February 2024 when he was admitted with acute onset shortness of breath.  During that admission he was found to be significantly hypertensive requiring multiple medications for BP control. In addition he underwent right and left heart cath with which demonstrated preserved cardiac index and no significant coronary artery disease.  He had an echocardiogram with LVEF of 35%.  Patient was discharged home on GDMT. 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    Seen 04/12/23 in AHF clinic. Reported feeling well. No medication changes made.   Follow-up TTE 04/2023 showed improved LVEF to 55-60%  on GDMT. Also with grade I diastolic dysfunction.  Seen 06/28/23 by Clarisa Kindred where BP was 170/100 mmHg. Taking hydralazine BID rather than TID, likely was not taking Imdur and reported missed doses of other medications due to work schedule. Instructed to increase hydralazine back to TID and resume Imdur.  Seen 07/04/23 for sleep test. Started CPAP.  Seen by HF pharmacist 07/18/23. Hydralazine and Imdur were change to BiDil and Marcelline Deist was resumed.   Today Story Vanvranken returns to Heart Failure Clinic for pharmacist medication titration. Reports feeling good, however has some fatigue that he contributes to his 2nd shift work schedule. Denies dizziness, lightheadedness, fatigue. chest pain, palpitations, SOB, LEE, orthopnea, orthostasis, and PND. Reports being able to complete all activities of daily living (ADLs). Is not very active throughout the day. Weight at home has been trending down last visit, but now back up to 363 pounds. Takes torsemide 20 mg roughly twice per week. Reports difficultly taking it daily due to few stops on  the drive from home to work. Appetite has been reduced secondary to Ozempic. Somewhat follows a low sodium diet. Patient has not been checking blood glucoses at home.  Current HF Medications: -Carvedilol 25 mg BID -Entresto 97-103 mg BID -Spironolactone 25 mg daily -Farxiga 10 mg daily -BiDil 1 tablet TID -Torsemide 20 mg daily  Has the patient been experiencing any side effects to the medications prescribed? Yes, sexual dysfunction.  Does the patient have any problems obtaining medications due to transportation or finances? No. Enrolled in [facility] mail order pharmacy.  Isosorbide-hydralazine 90 days $22.50  Farxiga 90 days $22.50  Entresto 90 days $944.29 (30 days $327.82) Eligible for copay cards  Understanding of regimen: Good  Understanding of indications: Good  Potential of adherence: Good. Reports taking medications as prescribed.  Patient understands to avoid NSAIDs. Patient understands to avoid decongestants.  Pertinent Lab Values: Creatinine, Ser  Date Value Ref Range Status  08/15/2023 1.80 (H) 0.61 - 1.24 mg/dL Final   BUN  Date Value Ref Range Status  08/15/2023 23 (H) 6 - 20 mg/dL Final   Potassium  Date Value Ref Range Status  08/15/2023 3.5 3.5 - 5.1 mmol/L Final   Sodium  Date Value Ref Range Status  08/15/2023 137 135 - 145 mmol/L Final   B Natriuretic Peptide  Date Value Ref Range Status  08/15/2023 14.4 0.0 - 100.0 pg/mL Final    Comment:    Performed at [facility], [address], [place], [place] [address]   Magnesium  Date Value Ref Range Status  12/24/2022 2.1 1.7 - 2.4 mg/dL Final  Comment:    Performed at [facility], [address] [place], [place] [address]   TSH  Date Value Ref Range Status  04/12/2023 1.855 0.350 - 4.500 uIU/mL Final    Comment:    Performed by a 3rd Generation assay with a functional sensitivity of <=0.01 uIU/mL. Performed at [facility], [address]  [place], [place] [address]     Vital Signs: Today's Vitals   08/15/23 1148  BP: 129/77  Pulse: 90  SpO2: 96%  Weight: (!) 363 lb (164.7 kg)    Assessment/Plan: Heart failure with reduced ejection fraction Etiology of HF: Likely hypertensive cardiomyopathy. Repeat TTE showed improvement in LVEF from 35-40% to 55-60%. NYHA class / AHA Stage:IIB Volume status & Diuretics: Euvolemic, taking torsemide 20mg  as needed. Weight up, but patient endorses poor diet and exercise. He is motivated to make changes. Vasodilators:BP at goal today. Continue Entresto 97/103mg  BID. Discussed with MD, given sexual dysfunction, will start Viagra 20 mg prn for ED and change BiDil to hydralazine 25 mg TID due to the potential drug interaction. Beta-Blocker: HR elevated on carvedilol 25 mg twice daily. Will increase to 50 mg twice daily MRA: Continue spironolactone 25 mg Cardiometabolic: Continue Farxiga 10 mg daily. Fatigue may be a sign of low blood glucose. Checking BMP today, but patient reports he will start checking glucose at home. If CBG is low at home, may require stopping glimepiride. Devices therapies & Valvulopathies: Not currently indicated Advanced therapies: Not currently indicated   2. Hypertension  -Continue Entresto, spironolactone and carvedilol. Transition to BiDil as outlined above.   3. T2DM -Well-controlled.  Hemoglobin A1c of 6.8 in August 2024.  Continue Farxiga 10 mg daily. May require reduction or stopping of glimepiride if BG is low at home. Patient confirmed he will begin using his Freestyle [place].   4. Obesity -Body mass index is 52.54 kg/m. -Started on Ozempic several months ago -Discussed weight loss once again today.    5. CKD -Continue Farxiga    6. Sleep apnea - Using CPAP    7. Hyperlipidemia - LDL 60, HDL 36 on 12/24/22 - Repeat in 52m - Continue Crestor 20mg  daily  Follow up: January with CHF physician  Thank you for involving pharmacy in this patient's  care.  Enos Fling, PharmD, BCPS Phone - [phone] Clinical Pharmacist 08/15/2023 11:54 AM

## 2023-08-13 ENCOUNTER — Other Ambulatory Visit (HOSPITAL_COMMUNITY): Payer: Self-pay

## 2023-08-13 ENCOUNTER — Other Ambulatory Visit: Payer: Self-pay

## 2023-08-14 ENCOUNTER — Other Ambulatory Visit: Payer: Self-pay

## 2023-08-15 ENCOUNTER — Other Ambulatory Visit
Admission: RE | Admit: 2023-08-15 | Discharge: 2023-08-15 | Disposition: A | Discharge status: Home / Self Care | Payer: Federal, State, Local not specified - PPO | Source: Ambulatory Visit | Attending: Cardiology | Admitting: Cardiology

## 2023-08-15 ENCOUNTER — Ambulatory Visit (HOSPITAL_BASED_OUTPATIENT_CLINIC_OR_DEPARTMENT_OTHER): Payer: Federal, State, Local not specified - PPO | Admitting: Pharmacist

## 2023-08-15 ENCOUNTER — Ambulatory Visit (HOSPITAL_BASED_OUTPATIENT_CLINIC_OR_DEPARTMENT_OTHER): Payer: Federal, State, Local not specified - PPO | Admitting: Cardiology

## 2023-08-15 ENCOUNTER — Other Ambulatory Visit: Payer: Self-pay

## 2023-08-15 ENCOUNTER — Other Ambulatory Visit (HOSPITAL_COMMUNITY): Payer: Self-pay

## 2023-08-15 VITALS — BP 129/77 | HR 90 | Wt 363.0 lb

## 2023-08-15 DIAGNOSIS — E785 Hyperlipidemia, unspecified: Secondary | ICD-10-CM | POA: Insufficient documentation

## 2023-08-15 DIAGNOSIS — E1122 Type 2 diabetes mellitus with diabetic chronic kidney disease: Secondary | ICD-10-CM | POA: Diagnosis not present

## 2023-08-15 DIAGNOSIS — I10 Essential (primary) hypertension: Secondary | ICD-10-CM

## 2023-08-15 DIAGNOSIS — Z7985 Long-term (current) use of injectable non-insulin antidiabetic drugs: Secondary | ICD-10-CM | POA: Insufficient documentation

## 2023-08-15 DIAGNOSIS — I130 Hypertensive heart and chronic kidney disease with heart failure and stage 1 through stage 4 chronic kidney disease, or unspecified chronic kidney disease: Secondary | ICD-10-CM | POA: Diagnosis not present

## 2023-08-15 DIAGNOSIS — N522 Drug-induced erectile dysfunction: Secondary | ICD-10-CM

## 2023-08-15 DIAGNOSIS — I5032 Chronic diastolic (congestive) heart failure: Secondary | ICD-10-CM

## 2023-08-15 DIAGNOSIS — I5020 Unspecified systolic (congestive) heart failure: Secondary | ICD-10-CM | POA: Diagnosis not present

## 2023-08-15 DIAGNOSIS — N189 Chronic kidney disease, unspecified: Secondary | ICD-10-CM | POA: Diagnosis not present

## 2023-08-15 DIAGNOSIS — E669 Obesity, unspecified: Secondary | ICD-10-CM | POA: Insufficient documentation

## 2023-08-15 DIAGNOSIS — Z6841 Body Mass Index (BMI) 40.0 and over, adult: Secondary | ICD-10-CM | POA: Diagnosis not present

## 2023-08-15 DIAGNOSIS — Z79899 Other long term (current) drug therapy: Secondary | ICD-10-CM | POA: Diagnosis not present

## 2023-08-15 DIAGNOSIS — G4730 Sleep apnea, unspecified: Secondary | ICD-10-CM | POA: Diagnosis not present

## 2023-08-15 LAB — BASIC METABOLIC PANEL
Anion gap: 12 (ref 5–15)
BUN: 23 mg/dL — ABNORMAL HIGH (ref 6–20)
CO2: 26 mmol/L (ref 22–32)
Calcium: 8.9 mg/dL (ref 8.9–10.3)
Chloride: 99 mmol/L (ref 98–111)
Creatinine, Ser: 1.8 mg/dL — ABNORMAL HIGH (ref 0.61–1.24)
GFR, Estimated: 47 mL/min — ABNORMAL LOW (ref >60)
Glucose, Bld: 131 mg/dL — ABNORMAL HIGH (ref 70–99)
Potassium: 3.5 mmol/L (ref 3.5–5.1)
Sodium: 137 mmol/L (ref 135–145)

## 2023-08-15 LAB — BRAIN NATRIURETIC PEPTIDE: B Natriuretic Peptide: 14.4 pg/mL (ref 0.0–100.0)

## 2023-08-15 MED ORDER — SILDENAFIL CITRATE 50 MG PO TABS
50.0000 mg | ORAL_TABLET | Freq: Every day | ORAL | 0 refills | Status: DC | PRN
  Filled 2023-08-15 – 2023-08-21 (×2): qty 10, 10d supply, fill #0

## 2023-08-15 MED ORDER — HYDRALAZINE HCL 25 MG PO TABS
25.0000 mg | ORAL_TABLET | Freq: Three times a day (TID) | ORAL | 3 refills | Status: DC
  Filled 2023-08-15 – 2023-09-13 (×2): qty 270, 90d supply, fill #0
  Filled 2023-12-22: qty 270, 90d supply, fill #1

## 2023-08-15 MED ORDER — CARVEDILOL 25 MG PO TABS
50.0000 mg | ORAL_TABLET | Freq: Two times a day (BID) | ORAL | 3 refills | Status: DC
  Filled 2023-08-15 – 2023-09-13 (×2): qty 60, 15d supply, fill #0
  Filled 2023-11-14: qty 60, 15d supply, fill #1
  Filled 2023-11-25: qty 60, 15d supply, fill #2
  Filled 2023-12-11: qty 60, 15d supply, fill #3

## 2023-08-15 NOTE — Patient Instructions (Signed)
Medication Changes:  May take Sildenafil 50 mg (1 tablet) as needed.  Lab Work:  Labs done today, your results will be available in MyChart, we will contact you for abnormal readings.    Special Instructions // Education:  Do the following things EVERYDAY: Weigh yourself in the morning before breakfast. Write it down and keep it in a log. Take your medicines as prescribed Eat low salt foods--Limit salt (sodium) to 2000 mg per day.  Stay as active as you can everyday Limit all fluids for the day to less than 2 liters   Follow-Up in: Follow up in 4 months.     If you have any questions or concerns before your next appointment please send [place] a message through [place] or call our office at [phone] Monday-Friday 8 am-5 pm.   If you have an urgent need after hours on the weekend please call your Primary Cardiologist or the Advanced Heart Failure Clinic in [place] at [phone].

## 2023-08-15 NOTE — Patient Instructions (Signed)
It was a pleasure seeing you today!  MEDICATIONS: -We are changing your medications today -Increase carvedilol to 50 mg (2 tablets) twice daily -Stop taking BiDil (isosorbide/hydralazine) -Start taking hydralazine 25 mg three times daily -Call if you have questions about your medications.  LABS: -We will call you if your labs need attention.  NEXT APPOINTMENT: Return to clinic on January 3rd 2025 with Dr. Gasper Lloyd.  In general, to take care of your heart failure: -Limit your fluid intake to 2 Liters (half-gallon) per day.   -Limit your salt intake to ideally 2-3 grams (2000-3000 mg) per day. -Weigh yourself daily and record, and bring that "weight diary" to your next appointment.  (Weight gain of 2-3 pounds in 1 day typically means fluid weight.) -The medications for your heart are to help your heart and help you live longer.   -Please contact us before stopping any of your heart medications.  Call the clinic at [phone] with questions or to reschedule future appointments.

## 2023-08-15 NOTE — Progress Notes (Signed)
ADVANCED HEART FAILURE CLINIC NOTE  Referring Physician: Dan Maker, MD  Primary Care: Dan Maker, MD   HPI: Jordan Greer is a 42 y.o. male with HFrEF 2/2 NICM, HTN, OSA on CPAP, T2DM, and obesity presenting today to establish care. Jordan Greer' cardiac history dates back to February 2024 when he was admitted with acute onset shortness of breath.  During that admission he was found to be significantly hypertensive requiring multiple antihypertensive medications for control.  In addition he underwent right and left heart cath with which demonstrated preserved cardiac index and no significant coronary artery disease.  He had an echocardiogram with LVEF of 35%.  Patient was discharged home on GDMT. 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   Interval history: Reports that he is doing very well from a heart failure standpoint.  His shortness of breath has remained fairly stable.  Unfortunately he continues to struggle with weight.  His BMI is 53.  We discussed the importance of continued dietary discretion and weight loss.  Activity level/exercise tolerance: NYHA II Orthopnea:  Sleeps on 2 pillows Paroxysmal noctural dyspnea: No Chest pain/pressure: No Orthostatic lightheadedness:  No Palpitations:  No Lower extremity edema:  No Presyncope/syncope:  No Cough:  No  Past Medical History:  Diagnosis Date   CHF (congestive heart failure) (HCC)    Colitis    Colitis    Diabetes mellitus without complication (HCC)    Hyperlipidemia    Hypertension    Obstructive sleep apnea     Current Outpatient Medications  Medication Sig Dispense Refill   carvedilol (COREG) 25 MG tablet Take 1 tablet (25 mg) by mouth 2 times daily with a meal. 60 tablet 3   Continuous Glucose Receiver (FREESTYLE LIBRE 2 READER) DEVI Use as directed. 1 each 1   Continuous Glucose Sensor (FREESTYLE LIBRE 2 SENSOR) MISC Use as directed for continuous blood glucose monitoring. Change  every 14 days 2 each 11   dapagliflozin propanediol (FARXIGA) 10 MG TABS tablet Take 1 tablet (10 mg) by mouth daily. 30 tablet 2   glimepiride (AMARYL) 4 MG tablet Take 1 tablet (4 mg total) by mouth 2 (two) times daily. 180 tablet 1   glipiZIDE (GLUCOTROL XL) 10 MG 24 hr tablet Take 10 mg by mouth 2 (two) times daily.      Ipratropium-Albuterol (COMBIVENT) 20-100 MCG/ACT AERS respimat Inhale 1 puff into the lungs every 6 (six) hours. 4 g 0   isosorbide-hydrALAZINE (BIDIL) 20-37.5 MG tablet Take 1 tablet by mouth 3 (three) times daily. 90 tablet 11   metFORMIN (GLUCOPHAGE-XR) 500 MG 24 hr tablet Take 4 tablets (2,000 mg total) by mouth daily. 360 tablet 1   OZEMPIC, 2 MG/DOSE, 8 MG/3ML SOPN Inject 2 mg into the skin once a week.     rosuvastatin (CRESTOR) 20 MG tablet Take 1 tablet (20 mg) by mouth at bedtime. 90 tablet 1   sacubitril-valsartan (ENTRESTO) 97-103 MG Take 1 tablet by mouth 2 times daily. 60 tablet 1   Semaglutide, 2 MG/DOSE, (OZEMPIC, 2 MG/DOSE,) 8 MG/3ML SOPN Inject 2 mg into the skin once a week. 3 mL 6   spironolactone (ALDACTONE) 25 MG tablet Take 1 tablet (25 mg) by mouth daily. 30 tablet 1   torsemide (DEMADEX) 20 MG tablet Take 1 tablet (20 mg total) by mouth daily as needed. 90 tablet 3   No current facility-administered medications for this visit.    Allergies  Allergen Reactions  Fish Allergy Itching and Nausea And Vomiting      Social History   Socioeconomic History   Marital status: Married    Spouse name: Not on file   Number of children: Not on file   Years of education: Not on file   Highest education level: Not on file  Occupational History   Not on file  Tobacco Use   Smoking status: Never   Smokeless tobacco: Never  Substance and Sexual Activity   Alcohol use: Yes    Alcohol/week: 18.0 standard drinks of alcohol    Types: 18 Cans of beer per week   Drug use: No   Sexual activity: Yes  Other Topics Concern   Not on file  Social History  Narrative   Not on file   Social Determinants of Health   Financial Resource Strain: Not on file  Food Insecurity: No Food Insecurity (12/24/2022)   Hunger Vital Sign    Worried About Running Out of Food in the Last Year: Never true    Ran Out of Food in the Last Year: Never true  Transportation Needs: No Transportation Needs (12/24/2022)   PRAPARE - Administrator, Civil Service (Medical): No    Lack of Transportation (Non-Medical): No  Physical Activity: Not on file  Stress: Not on file  Social Connections: Unknown (03/18/2022)   Received from [facility]   Social Network    Social Network: Not on file  Intimate Partner Violence: Not At Risk (12/24/2022)   Humiliation, Afraid, Rape, and Kick questionnaire    Fear of Current or Ex-Partner: No    Emotionally Abused: No    Physically Abused: No    Sexually Abused: No      Family History  Problem Relation Age of Onset   Hypertension Mother    Diabetes Mellitus II Mother     PHYSICAL EXAM: Vitals:   08/15/23 0944  BP: 129/77  Pulse: 90  SpO2: 96%   GENERAL: Well nourished, well developed, and in no apparent distress at rest.  HEENT: Negative for arcus senilis or xanthelasma. There is no scleral icterus.  The mucous membranes are pink and moist.   NECK: Supple, No masses. Normal carotid upstrokes without bruits. No masses or thyromegaly.    CHEST: There are no chest wall deformities. There is no chest wall tenderness. Respirations are unlabored.  Lungs- CTA B/L CARDIAC:  JVP: 7 cm          Normal rate with regular rhythm. No murmurs, rubs or gallops.  Pulses are 2+ and symmetrical in upper and lower extremities. no edema.  ABDOMEN: Soft, non-tender, non-distended. There are no masses or hepatomegaly. There are normal bowel sounds.  EXTREMITIES: Warm and well perfused with no cyanosis, clubbing.  LYMPHATIC: No axillary or supraclavicular lymphadenopathy.  NEUROLOGIC: Patient is oriented x3 with no  focal or lateralizing neurologic deficits.  PSYCH: Patients affect is appropriate, there is no evidence of anxiety or depression.  SKIN: Warm and dry; no lesions or wounds.     DATA REVIEW  ECG: 01/07/2023: Normal sinus rhythm as per my personal interpretation  ECHO: LVEF 35%, moderate LVH, normal RV function as per my personal interpretation  CATH: 12/25/22: No angiographically significant coronary artery disease.  Findings are consistent with nonischemic cardiomyopathy. Moderately elevated left heart, right heart, and pulmonary artery pressures. Normal Fick cardiac output/index.   ASSESSMENT & PLAN:  Heart failure with reduced ejection fraction Etiology of HF: Likely hypertensive cardiomyopathy. TSH today.  Repeat TTE at next apointment.  NYHA class / AHA Stage:IIB Volume status & Diuretics: Euvolemic, taking torsemide 20mg . Vasodilators:Entresto 97/103mg  BID, D/C imdur; hydralazine 25mg  TID.  Beta-Blocker: Coreg 25 mg daily; increase coreg to 37.5mg  BID.  MRA: Spironolactone 25 mg Cardiometabolic: Farxiga 10 mg daily Devices therapies & Valvulopathies: Not currently indicated Advanced therapies: Not currently indicated  2. Hypertension  -Now on Entresto 97/103 mg twice daily. - Hydralazine 25mg  TID ; holding imdur (wishes to start taking viagra) - Increase coreg to 37.5mg  BID.   3. T2DM -Well-controlled.  Hemoglobin A1c of 6 in February 2024.  Now on Farxiga 10 mg.  4. Obesity -Body mass index is 53.61 kg/m. -Started on Ozempic several months ago -discussed weight loss once again today.   5. CKD -Continue Marcelline Deist - repeat labs today.   6. Sleep apnea - using CPAP - refer to sleep medicine  7. Hyperlipidemia - LDL 60, HDL 36 on 12/24/22 - Repeat in 8m - continue crestor 20mg  daily  8. ED - start viagra 20mg  PRN.   Jordan Greer Advanced Heart Failure Mechanical Circulatory Support

## 2023-08-16 ENCOUNTER — Encounter: Payer: Self-pay | Admitting: Cardiology

## 2023-08-21 ENCOUNTER — Encounter (HOSPITAL_COMMUNITY): Payer: Self-pay

## 2023-08-21 ENCOUNTER — Other Ambulatory Visit (HOSPITAL_COMMUNITY): Payer: Self-pay

## 2023-08-31 DIAGNOSIS — G4733 Obstructive sleep apnea (adult) (pediatric): Secondary | ICD-10-CM | POA: Diagnosis not present

## 2023-09-12 DIAGNOSIS — Z131 Encounter for screening for diabetes mellitus: Secondary | ICD-10-CM | POA: Diagnosis not present

## 2023-09-12 DIAGNOSIS — E559 Vitamin D deficiency, unspecified: Secondary | ICD-10-CM | POA: Diagnosis not present

## 2023-09-12 DIAGNOSIS — Z13220 Encounter for screening for lipoid disorders: Secondary | ICD-10-CM | POA: Diagnosis not present

## 2023-09-12 DIAGNOSIS — E119 Type 2 diabetes mellitus without complications: Secondary | ICD-10-CM | POA: Diagnosis not present

## 2023-09-13 ENCOUNTER — Other Ambulatory Visit: Payer: Self-pay | Admitting: Family

## 2023-09-13 ENCOUNTER — Other Ambulatory Visit: Payer: Self-pay

## 2023-09-13 ENCOUNTER — Other Ambulatory Visit (HOSPITAL_COMMUNITY): Payer: Self-pay

## 2023-09-13 ENCOUNTER — Other Ambulatory Visit: Payer: Self-pay | Admitting: Cardiology

## 2023-09-13 MED ORDER — ENTRESTO 97-103 MG PO TABS
1.0000 | ORAL_TABLET | Freq: Two times a day (BID) | ORAL | 3 refills | Status: DC
  Filled 2023-09-13: qty 60, 30d supply, fill #0
  Filled 2023-11-14: qty 60, 30d supply, fill #1
  Filled 2023-12-16: qty 60, 30d supply, fill #2
  Filled 2024-01-10: qty 60, 30d supply, fill #3

## 2023-09-13 MED ORDER — SPIRONOLACTONE 25 MG PO TABS
25.0000 mg | ORAL_TABLET | Freq: Every day | ORAL | 3 refills | Status: DC
  Filled 2023-09-13: qty 30, 30d supply, fill #0
  Filled 2023-11-14: qty 30, 30d supply, fill #1
  Filled 2023-12-11: qty 30, 30d supply, fill #2
  Filled 2024-01-10: qty 30, 30d supply, fill #3

## 2023-09-13 MED ORDER — SILDENAFIL CITRATE 50 MG PO TABS
50.0000 mg | ORAL_TABLET | Freq: Every day | ORAL | 0 refills | Status: DC | PRN
  Filled 2023-09-13: qty 10, 10d supply, fill #0

## 2023-09-16 ENCOUNTER — Other Ambulatory Visit: Payer: Self-pay

## 2023-09-16 ENCOUNTER — Other Ambulatory Visit (HOSPITAL_COMMUNITY): Payer: Self-pay

## 2023-09-16 MED ORDER — ROSUVASTATIN CALCIUM 20 MG PO TABS
20.0000 mg | ORAL_TABLET | Freq: Every day | ORAL | 3 refills | Status: DC
  Filled 2023-09-16 – 2023-11-14 (×2): qty 90, 90d supply, fill #0

## 2023-09-16 MED ORDER — OZEMPIC (2 MG/DOSE) 8 MG/3ML ~~LOC~~ SOPN
2.0000 mg | PEN_INJECTOR | SUBCUTANEOUS | 6 refills | Status: DC
  Filled 2023-09-16 – 2024-01-10 (×4): qty 3, 28d supply, fill #0
  Filled 2024-02-13: qty 3, 28d supply, fill #1
  Filled 2024-04-08: qty 3, 28d supply, fill #2
  Filled 2024-05-10: qty 3, 28d supply, fill #3
  Filled 2024-06-07: qty 3, 28d supply, fill #4
  Filled 2024-07-05: qty 3, 28d supply, fill #5
  Filled 2024-08-18: qty 3, 28d supply, fill #6

## 2023-09-18 ENCOUNTER — Other Ambulatory Visit (HOSPITAL_COMMUNITY): Payer: Self-pay

## 2023-09-19 ENCOUNTER — Other Ambulatory Visit (HOSPITAL_COMMUNITY): Payer: Self-pay

## 2023-09-19 DIAGNOSIS — Z1331 Encounter for screening for depression: Secondary | ICD-10-CM | POA: Diagnosis not present

## 2023-09-19 DIAGNOSIS — I10 Essential (primary) hypertension: Secondary | ICD-10-CM | POA: Diagnosis not present

## 2023-09-19 DIAGNOSIS — N183 Chronic kidney disease, stage 3 unspecified: Secondary | ICD-10-CM | POA: Diagnosis not present

## 2023-09-19 DIAGNOSIS — Z0000 Encounter for general adult medical examination without abnormal findings: Secondary | ICD-10-CM | POA: Diagnosis not present

## 2023-09-19 DIAGNOSIS — E1122 Type 2 diabetes mellitus with diabetic chronic kidney disease: Secondary | ICD-10-CM | POA: Diagnosis not present

## 2023-09-19 DIAGNOSIS — I509 Heart failure, unspecified: Secondary | ICD-10-CM | POA: Diagnosis not present

## 2023-09-19 MED ORDER — OZEMPIC (2 MG/DOSE) 8 MG/3ML ~~LOC~~ SOPN
2.0000 mg | PEN_INJECTOR | SUBCUTANEOUS | 11 refills | Status: DC
  Filled 2023-09-19: qty 3, 28d supply, fill #0
  Filled 2023-11-14: qty 3, fill #0
  Filled 2024-03-10: qty 3, 28d supply, fill #0

## 2023-09-23 ENCOUNTER — Other Ambulatory Visit: Payer: Self-pay

## 2023-09-28 ENCOUNTER — Other Ambulatory Visit (HOSPITAL_COMMUNITY): Payer: Self-pay

## 2023-10-13 DIAGNOSIS — G4733 Obstructive sleep apnea (adult) (pediatric): Secondary | ICD-10-CM | POA: Diagnosis not present

## 2023-10-24 DIAGNOSIS — E119 Type 2 diabetes mellitus without complications: Secondary | ICD-10-CM | POA: Diagnosis not present

## 2023-10-24 DIAGNOSIS — H04123 Dry eye syndrome of bilateral lacrimal glands: Secondary | ICD-10-CM | POA: Diagnosis not present

## 2023-10-24 DIAGNOSIS — H524 Presbyopia: Secondary | ICD-10-CM | POA: Diagnosis not present

## 2023-11-12 DIAGNOSIS — G4733 Obstructive sleep apnea (adult) (pediatric): Secondary | ICD-10-CM | POA: Diagnosis not present

## 2023-11-14 ENCOUNTER — Other Ambulatory Visit (HOSPITAL_COMMUNITY): Payer: Self-pay

## 2023-11-14 ENCOUNTER — Other Ambulatory Visit: Payer: Self-pay | Admitting: Family

## 2023-11-14 ENCOUNTER — Other Ambulatory Visit: Payer: Self-pay

## 2023-11-14 MED ORDER — DAPAGLIFLOZIN PROPANEDIOL 10 MG PO TABS
10.0000 mg | ORAL_TABLET | Freq: Every day | ORAL | 11 refills | Status: DC
  Filled 2023-11-14: qty 30, 30d supply, fill #0
  Filled 2023-11-25 – 2023-12-11 (×2): qty 30, 30d supply, fill #1
  Filled 2024-01-10: qty 30, 30d supply, fill #2
  Filled 2024-02-07: qty 30, 30d supply, fill #3
  Filled 2024-03-10: qty 30, 30d supply, fill #4
  Filled 2024-04-23: qty 30, 30d supply, fill #5
  Filled 2024-05-22: qty 30, 30d supply, fill #6
  Filled 2024-06-22: qty 30, 30d supply, fill #7
  Filled 2024-07-25: qty 30, 30d supply, fill #8
  Filled 2024-09-11: qty 30, 30d supply, fill #9
  Filled 2024-10-12: qty 30, 30d supply, fill #10
  Filled 2024-10-29: qty 30, 30d supply, fill #11

## 2023-11-22 ENCOUNTER — Other Ambulatory Visit: Payer: Self-pay

## 2023-11-22 ENCOUNTER — Ambulatory Visit (HOSPITAL_BASED_OUTPATIENT_CLINIC_OR_DEPARTMENT_OTHER): Payer: Federal, State, Local not specified - PPO | Admitting: Cardiology

## 2023-11-22 ENCOUNTER — Other Ambulatory Visit
Admission: RE | Admit: 2023-11-22 | Discharge: 2023-11-22 | Disposition: A | Discharge status: Home / Self Care | Payer: Federal, State, Local not specified - PPO | Source: Ambulatory Visit | Attending: Cardiology | Admitting: Cardiology

## 2023-11-22 VITALS — BP 127/92 | HR 84 | Wt 362.0 lb

## 2023-11-22 DIAGNOSIS — Z6841 Body Mass Index (BMI) 40.0 and over, adult: Secondary | ICD-10-CM | POA: Insufficient documentation

## 2023-11-22 DIAGNOSIS — E669 Obesity, unspecified: Secondary | ICD-10-CM | POA: Diagnosis not present

## 2023-11-22 DIAGNOSIS — E1169 Type 2 diabetes mellitus with other specified complication: Secondary | ICD-10-CM

## 2023-11-22 DIAGNOSIS — I5032 Chronic diastolic (congestive) heart failure: Secondary | ICD-10-CM | POA: Insufficient documentation

## 2023-11-22 DIAGNOSIS — I130 Hypertensive heart and chronic kidney disease with heart failure and stage 1 through stage 4 chronic kidney disease, or unspecified chronic kidney disease: Secondary | ICD-10-CM | POA: Diagnosis not present

## 2023-11-22 DIAGNOSIS — Z7985 Long-term (current) use of injectable non-insulin antidiabetic drugs: Secondary | ICD-10-CM | POA: Insufficient documentation

## 2023-11-22 DIAGNOSIS — E119 Type 2 diabetes mellitus without complications: Secondary | ICD-10-CM | POA: Insufficient documentation

## 2023-11-22 DIAGNOSIS — E785 Hyperlipidemia, unspecified: Secondary | ICD-10-CM | POA: Insufficient documentation

## 2023-11-22 DIAGNOSIS — E1159 Type 2 diabetes mellitus with other circulatory complications: Secondary | ICD-10-CM | POA: Diagnosis not present

## 2023-11-22 DIAGNOSIS — N189 Chronic kidney disease, unspecified: Secondary | ICD-10-CM | POA: Diagnosis not present

## 2023-11-22 DIAGNOSIS — Z7984 Long term (current) use of oral hypoglycemic drugs: Secondary | ICD-10-CM | POA: Diagnosis not present

## 2023-11-22 DIAGNOSIS — I152 Hypertension secondary to endocrine disorders: Secondary | ICD-10-CM

## 2023-11-22 DIAGNOSIS — G4733 Obstructive sleep apnea (adult) (pediatric): Secondary | ICD-10-CM | POA: Diagnosis not present

## 2023-11-22 LAB — LIPID PANEL
Cholesterol: 214 mg/dL — ABNORMAL HIGH (ref 0–200)
HDL: 44 mg/dL (ref >40)
LDL Cholesterol: 127 mg/dL — ABNORMAL HIGH (ref 0–99)
Total CHOL/HDL Ratio: 4.9 {ratio}
Triglycerides: 216 mg/dL — ABNORMAL HIGH (ref <150)
VLDL: 43 mg/dL — ABNORMAL HIGH (ref 0–40)

## 2023-11-22 LAB — BASIC METABOLIC PANEL
Anion gap: 11 (ref 5–15)
BUN: 26 mg/dL — ABNORMAL HIGH (ref 6–20)
CO2: 25 mmol/L (ref 22–32)
Calcium: 8.9 mg/dL (ref 8.9–10.3)
Chloride: 100 mmol/L (ref 98–111)
Creatinine, Ser: 1.81 mg/dL — ABNORMAL HIGH (ref 0.61–1.24)
GFR, Estimated: 47 mL/min — ABNORMAL LOW (ref >60)
Glucose, Bld: 156 mg/dL — ABNORMAL HIGH (ref 70–99)
Potassium: 3.5 mmol/L (ref 3.5–5.1)
Sodium: 136 mmol/L (ref 135–145)

## 2023-11-22 LAB — BRAIN NATRIURETIC PEPTIDE: B Natriuretic Peptide: 19.3 pg/mL (ref 0.0–100.0)

## 2023-11-22 MED ORDER — AMLODIPINE BESYLATE 5 MG PO TABS
5.0000 mg | ORAL_TABLET | Freq: Every day | ORAL | 3 refills | Status: DC
  Filled 2023-11-22: qty 90, 90d supply, fill #0
  Filled 2024-02-07: qty 90, 90d supply, fill #1

## 2023-11-22 NOTE — Progress Notes (Signed)
 ADVANCED HEART FAILURE CLINIC NOTE  Referring Physician: Theotis Renay BIRCH, MD  Primary Care: Jordan Renay BIRCH, MD   HPI: Jordan Greer is a 43 y.o. male with HFrEF 2/2 NICM, HTN, OSA on CPAP, T2DM, and obesity presenting today to establish care. Jordan Greer' cardiac history dates back to February 2024 when he was admitted with acute onset shortness of breath.  During that admission he was found to be significantly hypertensive requiring multiple antihypertensive medications for control.  In addition he underwent right and left heart cath with which demonstrated preserved cardiac index and no significant coronary artery disease.  He had an echocardiogram with LVEF of 35%.  Patient was discharged home on GDMT. 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   Interval history: - Reports that BP at home was 124/89 and overall has been relatively well controlled.   Activity level/exercise tolerance: NYHA II Orthopnea:  Sleeps on 2 pillows Paroxysmal noctural dyspnea: No Chest pain/pressure: No Orthostatic lightheadedness:  No Palpitations:  No Lower extremity edema:  No Presyncope/syncope:  No Cough:  No  Past Medical History:  Diagnosis Date   CHF (congestive heart failure) (HCC)    Colitis    Colitis    Diabetes mellitus without complication (HCC)    Hyperlipidemia    Hypertension    Obstructive sleep apnea     Current Outpatient Medications  Medication Sig Dispense Refill   carvedilol  (COREG ) 25 MG tablet Take 2 tablets (50 mg total) by mouth 2 (two) times daily with a meal. 60 tablet 3   Continuous Glucose Receiver (FREESTYLE LIBRE 2 READER) DEVI Use as directed. 1 each 1   Continuous Glucose Sensor (FREESTYLE LIBRE 2 SENSOR) MISC Use as directed for continuous blood glucose monitoring. Change every 14 days 2 each 11   dapagliflozin  propanediol (FARXIGA ) 10 MG TABS tablet Take 1 tablet (10 mg) by mouth daily. 30 tablet 11   glimepiride  (AMARYL ) 4 MG tablet  Take 1 tablet (4 mg total) by mouth 2 (two) times daily. 180 tablet 1   hydrALAZINE  (APRESOLINE ) 25 MG tablet Take 1 tablet (25 mg total) by mouth 3 (three) times daily. 270 tablet 3   metFORMIN  (GLUCOPHAGE -XR) 500 MG 24 hr tablet Take 4 tablets (2,000 mg total) by mouth daily. 360 tablet 1   OZEMPIC , 2 MG/DOSE, 8 MG/3ML SOPN Inject 2 mg into the skin once a week.     rosuvastatin  (CRESTOR ) 20 MG tablet Take 1 tablet (20 mg total) by mouth at bedtime. 90 tablet 3   sacubitril -valsartan  (ENTRESTO ) 97-103 MG Take 1 tablet by mouth 2 times daily. 60 tablet 3   Semaglutide , 2 MG/DOSE, (OZEMPIC , 2 MG/DOSE,) 8 MG/3ML SOPN Inject 2 mg into the skin once a week. 3 mL 6   Semaglutide , 2 MG/DOSE, (OZEMPIC , 2 MG/DOSE,) 8 MG/3ML SOPN Inject 2 mg into the skin once a week in the morning 3 mL 11   spironolactone  (ALDACTONE ) 25 MG tablet Take 1 tablet (25 mg total) by mouth daily. 30 tablet 3   torsemide  (DEMADEX ) 20 MG tablet Take 1 tablet (20 mg total) by mouth daily as needed. 90 tablet 3   Ipratropium-Albuterol  (COMBIVENT ) 20-100 MCG/ACT AERS respimat Inhale 1 puff into the lungs every 6 (six) hours. (Patient not taking: Reported on 11/22/2023) 4 g 0   sildenafil  (VIAGRA ) 50 MG tablet Take 1 tablet (50 mg total) by mouth daily as needed for erectile dysfunction. (Patient not taking: Reported on 11/22/2023) 10 tablet 0  No current facility-administered medications for this visit.    Allergies  Allergen Reactions   Fish Allergy Itching and Nausea And Vomiting      Social History   Socioeconomic History   Marital status: Married    Spouse name: Not on file   Number of children: Not on file   Years of education: Not on file   Highest education level: Not on file  Occupational History   Not on file  Tobacco Use   Smoking status: Never   Smokeless tobacco: Never  Substance and Sexual Activity   Alcohol use: Yes    Alcohol/week: 18.0 standard drinks of alcohol    Types: 18 Cans of beer per week    Drug use: No   Sexual activity: Yes  Other Topics Concern   Not on file  Social History Narrative   Not on file   Social Drivers of Health   Financial Resource Strain: Not on file  Food Insecurity: No Food Insecurity (12/24/2022)   Hunger Vital Sign    Worried About Running Out of Food in the Last Year: Never true    Ran Out of Food in the Last Year: Never true  Transportation Needs: No Transportation Needs (12/24/2022)   PRAPARE - Administrator, Civil Service (Medical): No    Lack of Transportation (Non-Medical): No  Physical Activity: Not on file  Stress: Not on file  Social Connections: Unknown (03/18/2022)   Received from [facility]   Social Network    Social Network: Not on file  Intimate Partner Violence: Not At Risk (12/24/2022)   Humiliation, Afraid, Rape, and Kick questionnaire    Fear of Current or Ex-Partner: No    Emotionally Abused: No    Physically Abused: No    Sexually Abused: No      Family History  Problem Relation Age of Onset   Hypertension Mother    Diabetes Mellitus II Mother     PHYSICAL EXAM: Vitals:   11/22/23 0941  BP: (!) 127/92  Pulse: 84  SpO2: 98%   GENERAL: Well nourished, well developed, and in no apparent distress at rest.  HEENT: There is no scleral icterus.  The mucous membranes are pink and moist.   CHEST: There are no chest wall deformities. There is no chest wall tenderness. Respirations are unlabored.  Lungs- CTA B/L CARDIAC:  JVP: 7 cm          Normal rate with regular rhythm. No murmur.  Pulses are 2+ and symmetrical in upper and lower extremities. No edema.  ABDOMEN: Soft, non-tender, non-distended. There are normal bowel sounds.  EXTREMITIES: Warm and well perfused.  NEUROLOGIC: Patient is oriented x3 with no obvious focal neurologic deficits.  PSYCH: Patients affect is appropriate SKIN: Warm and dry; no lesions or wounds.  [name]     DATA REVIEW  ECG: 01/07/2023: Normal sinus rhythm as per my  personal interpretation  ECHO: LVEF 35%, moderate LVH, normal RV function as per my personal interpretation  CATH: 12/25/22: No angiographically significant coronary artery disease.  Findings are consistent with nonischemic cardiomyopathy. Moderately elevated left heart, right heart, and pulmonary artery pressures. Normal Fick cardiac output/index.   ASSESSMENT & PLAN:  Heart failure with reduced ejection fraction Etiology of HF: Likely hypertensive cardiomyopathy. TSH today. Repeat TTE at next apointment.  NYHA class / AHA Stage:IIB Volume status & Diuretics: Euvolemic, taking torsemide  20mg . Vasodilators:Entresto  97/103mg  BID, D/C imdur ; hydralazine  25mg  TID. Start amlodipine  5mg  daily.  Beta-Blocker:  Coreg  25 mg daily; increase coreg  to 37.5mg  BID.  MRA: Spironolactone  25 mg Cardiometabolic: Farxiga  10 mg daily Devices therapies & Valvulopathies: Not currently indicated Advanced therapies: Not currently indicated  2. Hypertension  -Now on Entresto  97/103 mg twice daily. - Hydralazine  25mg  TID ; holding imdur  (wishes to start taking viagra ) - coreg  37.5mg  BID - amlodipine  5mg  daily - Repeat BMP/BNP today  3. T2DM -Well-controlled.  Hemoglobin A1c of 6 in February 2024.  Now on Farxiga  10 mg.  4. Obesity -Body mass index is 53.46 kg/m. -Started on Ozempic  several months ago -discussed weight loss once again today.   5. CKD -Continue Farxiga  - repeat labs today.   6. Sleep apnea - using CPAP - compliant with CPAP.   7. Hyperlipidemia - LDL 60, HDL 36 on 12/24/22 - continue crestor  20mg  daily - Repeat lipid panel today.   8. ED - start viagra  20mg  PRN.   Cranston Koors Advanced Heart Failure Mechanical Circulatory Support

## 2023-11-22 NOTE — Patient Instructions (Signed)
 START AMLODIPINE 5 MG ONCE DAILY  Go DOWN to LOWER LEVEL (LL) to have your blood work completed inside of [employer] office.  We will only call you if the results are abnormal or if the provider would like to make medication changes.

## 2023-11-25 ENCOUNTER — Other Ambulatory Visit: Payer: Self-pay

## 2023-11-25 ENCOUNTER — Other Ambulatory Visit (HOSPITAL_COMMUNITY): Payer: Self-pay

## 2023-11-25 ENCOUNTER — Telehealth (HOSPITAL_COMMUNITY): Payer: Self-pay | Admitting: Cardiology

## 2023-11-25 MED ORDER — ROSUVASTATIN CALCIUM 40 MG PO TABS
40.0000 mg | ORAL_TABLET | Freq: Every day | ORAL | 3 refills | Status: AC
  Filled 2023-11-25: qty 90, 90d supply, fill #0
  Filled 2024-04-23: qty 90, 90d supply, fill #1
  Filled 2024-07-05: qty 90, 90d supply, fill #2
  Filled 2024-10-19: qty 90, 90d supply, fill #3

## 2023-11-25 NOTE — Telephone Encounter (Signed)
 Patient called.  Patient aware.

## 2023-11-25 NOTE — Telephone Encounter (Signed)
-----   Message from [facility] sent at 11/22/2023  5:43 PM EST ----- Can we increase crestor to 40mg .   Thanks.

## 2023-12-11 ENCOUNTER — Other Ambulatory Visit (HOSPITAL_COMMUNITY): Payer: Self-pay

## 2023-12-13 DIAGNOSIS — G4733 Obstructive sleep apnea (adult) (pediatric): Secondary | ICD-10-CM | POA: Diagnosis not present

## 2023-12-16 ENCOUNTER — Other Ambulatory Visit (HOSPITAL_COMMUNITY): Payer: Self-pay

## 2023-12-22 ENCOUNTER — Other Ambulatory Visit: Payer: Self-pay | Admitting: Cardiology

## 2023-12-23 ENCOUNTER — Other Ambulatory Visit: Payer: Self-pay

## 2023-12-24 ENCOUNTER — Other Ambulatory Visit (HOSPITAL_COMMUNITY): Payer: Self-pay

## 2023-12-24 MED ORDER — CARVEDILOL 25 MG PO TABS
50.0000 mg | ORAL_TABLET | Freq: Two times a day (BID) | ORAL | 3 refills | Status: DC
  Filled 2023-12-24: qty 60, 15d supply, fill #0
  Filled 2024-01-10: qty 60, 15d supply, fill #1
  Filled 2024-02-07: qty 60, 15d supply, fill #2

## 2023-12-27 DIAGNOSIS — N183 Chronic kidney disease, stage 3 unspecified: Secondary | ICD-10-CM | POA: Diagnosis not present

## 2024-01-02 DIAGNOSIS — I509 Heart failure, unspecified: Secondary | ICD-10-CM | POA: Diagnosis not present

## 2024-01-02 DIAGNOSIS — E1122 Type 2 diabetes mellitus with diabetic chronic kidney disease: Secondary | ICD-10-CM | POA: Diagnosis not present

## 2024-01-02 DIAGNOSIS — N183 Chronic kidney disease, stage 3 unspecified: Secondary | ICD-10-CM | POA: Diagnosis not present

## 2024-01-10 ENCOUNTER — Other Ambulatory Visit (HOSPITAL_COMMUNITY): Payer: Self-pay

## 2024-01-11 ENCOUNTER — Other Ambulatory Visit (HOSPITAL_COMMUNITY): Payer: Self-pay

## 2024-01-13 ENCOUNTER — Other Ambulatory Visit: Payer: Self-pay

## 2024-01-13 ENCOUNTER — Other Ambulatory Visit (HOSPITAL_COMMUNITY): Payer: Self-pay

## 2024-01-13 MED ORDER — GLIMEPIRIDE 4 MG PO TABS
4.0000 mg | ORAL_TABLET | Freq: Two times a day (BID) | ORAL | 3 refills | Status: AC
  Filled 2024-01-13: qty 180, 90d supply, fill #0
  Filled 2024-06-22: qty 180, 90d supply, fill #1
  Filled 2024-10-16: qty 180, 90d supply, fill #2

## 2024-02-07 ENCOUNTER — Other Ambulatory Visit (HOSPITAL_COMMUNITY): Payer: Self-pay

## 2024-02-07 ENCOUNTER — Other Ambulatory Visit: Payer: Self-pay

## 2024-02-07 ENCOUNTER — Other Ambulatory Visit: Payer: Self-pay | Admitting: Family

## 2024-02-07 MED ORDER — SPIRONOLACTONE 25 MG PO TABS
25.0000 mg | ORAL_TABLET | Freq: Every day | ORAL | 3 refills | Status: DC
  Filled 2024-02-07: qty 30, 30d supply, fill #0
  Filled 2024-03-10: qty 30, 30d supply, fill #1
  Filled 2024-04-23: qty 30, 30d supply, fill #2
  Filled 2024-05-22: qty 30, 30d supply, fill #3

## 2024-02-07 MED ORDER — ENTRESTO 97-103 MG PO TABS
1.0000 | ORAL_TABLET | Freq: Two times a day (BID) | ORAL | 3 refills | Status: DC
  Filled 2024-02-07: qty 60, 30d supply, fill #0
  Filled 2024-04-23: qty 60, 30d supply, fill #1
  Filled 2024-05-22: qty 60, 30d supply, fill #2
  Filled 2024-06-22: qty 60, 30d supply, fill #3

## 2024-02-13 ENCOUNTER — Other Ambulatory Visit (HOSPITAL_COMMUNITY): Payer: Self-pay

## 2024-03-03 ENCOUNTER — Other Ambulatory Visit (HOSPITAL_COMMUNITY): Payer: Self-pay

## 2024-03-03 ENCOUNTER — Other Ambulatory Visit: Payer: Self-pay

## 2024-03-03 ENCOUNTER — Encounter (HOSPITAL_COMMUNITY): Payer: Self-pay

## 2024-03-03 DIAGNOSIS — Z23 Encounter for immunization: Secondary | ICD-10-CM | POA: Diagnosis not present

## 2024-03-03 DIAGNOSIS — S91311A Laceration without foreign body, right foot, initial encounter: Secondary | ICD-10-CM | POA: Diagnosis not present

## 2024-03-03 DIAGNOSIS — E1122 Type 2 diabetes mellitus with diabetic chronic kidney disease: Secondary | ICD-10-CM | POA: Diagnosis not present

## 2024-03-03 MED ORDER — MUPIROCIN 2 % EX OINT
TOPICAL_OINTMENT | CUTANEOUS | 1 refills | Status: AC
Start: 2024-03-03 — End: ?
  Filled 2024-03-03 (×2): qty 22, 10d supply, fill #0

## 2024-03-04 ENCOUNTER — Other Ambulatory Visit (HOSPITAL_COMMUNITY): Payer: Self-pay

## 2024-03-06 ENCOUNTER — Encounter: Admitting: Cardiology

## 2024-03-10 ENCOUNTER — Other Ambulatory Visit (HOSPITAL_COMMUNITY): Payer: Self-pay

## 2024-03-10 ENCOUNTER — Other Ambulatory Visit: Payer: Self-pay

## 2024-03-27 ENCOUNTER — Encounter: Admitting: Cardiology

## 2024-04-08 ENCOUNTER — Other Ambulatory Visit: Payer: Self-pay

## 2024-04-08 ENCOUNTER — Other Ambulatory Visit (HOSPITAL_COMMUNITY): Payer: Self-pay

## 2024-04-09 ENCOUNTER — Telehealth: Payer: Self-pay | Admitting: Cardiology

## 2024-04-09 NOTE — Telephone Encounter (Signed)
 Called to confirm/remind patient of their appointment at the [facility] on 04/10/24.   Appointment:   [x] Confirmed  [] Left mess   [] No answer/No voice mail  [] VM Full/unable to leave message  [] Phone not in service  Patient reminded to bring all medications and/or complete list.  Confirmed patient has transportation. Gave directions, instructed to utilize valet parking.

## 2024-04-10 ENCOUNTER — Ambulatory Visit (HOSPITAL_BASED_OUTPATIENT_CLINIC_OR_DEPARTMENT_OTHER): Admitting: Cardiology

## 2024-04-10 ENCOUNTER — Other Ambulatory Visit
Admission: RE | Admit: 2024-04-10 | Discharge: 2024-04-10 | Disposition: A | Discharge status: Home / Self Care | Source: Ambulatory Visit | Attending: Cardiology | Admitting: Cardiology

## 2024-04-10 VITALS — BP 137/98 | HR 84 | Wt 354.0 lb

## 2024-04-10 DIAGNOSIS — E669 Obesity, unspecified: Secondary | ICD-10-CM | POA: Diagnosis not present

## 2024-04-10 DIAGNOSIS — E66813 Obesity, class 3: Secondary | ICD-10-CM | POA: Diagnosis not present

## 2024-04-10 DIAGNOSIS — Z79899 Other long term (current) drug therapy: Secondary | ICD-10-CM | POA: Insufficient documentation

## 2024-04-10 DIAGNOSIS — E785 Hyperlipidemia, unspecified: Secondary | ICD-10-CM

## 2024-04-10 DIAGNOSIS — N1831 Chronic kidney disease, stage 3a: Secondary | ICD-10-CM

## 2024-04-10 DIAGNOSIS — G4733 Obstructive sleep apnea (adult) (pediatric): Secondary | ICD-10-CM

## 2024-04-10 DIAGNOSIS — Z7984 Long term (current) use of oral hypoglycemic drugs: Secondary | ICD-10-CM | POA: Insufficient documentation

## 2024-04-10 DIAGNOSIS — I5040 Unspecified combined systolic (congestive) and diastolic (congestive) heart failure: Secondary | ICD-10-CM | POA: Diagnosis not present

## 2024-04-10 DIAGNOSIS — N189 Chronic kidney disease, unspecified: Secondary | ICD-10-CM | POA: Insufficient documentation

## 2024-04-10 DIAGNOSIS — E1122 Type 2 diabetes mellitus with diabetic chronic kidney disease: Secondary | ICD-10-CM | POA: Diagnosis not present

## 2024-04-10 DIAGNOSIS — Z7985 Long-term (current) use of injectable non-insulin antidiabetic drugs: Secondary | ICD-10-CM | POA: Insufficient documentation

## 2024-04-10 DIAGNOSIS — I130 Hypertensive heart and chronic kidney disease with heart failure and stage 1 through stage 4 chronic kidney disease, or unspecified chronic kidney disease: Secondary | ICD-10-CM | POA: Insufficient documentation

## 2024-04-10 DIAGNOSIS — I5032 Chronic diastolic (congestive) heart failure: Secondary | ICD-10-CM | POA: Diagnosis not present

## 2024-04-10 DIAGNOSIS — Z6841 Body Mass Index (BMI) 40.0 and over, adult: Secondary | ICD-10-CM | POA: Insufficient documentation

## 2024-04-10 DIAGNOSIS — E1159 Type 2 diabetes mellitus with other circulatory complications: Secondary | ICD-10-CM

## 2024-04-10 DIAGNOSIS — I152 Hypertension secondary to endocrine disorders: Secondary | ICD-10-CM

## 2024-04-10 LAB — BASIC METABOLIC PANEL WITH GFR
Anion gap: 11 (ref 5–15)
BUN: 23 mg/dL — ABNORMAL HIGH (ref 6–20)
CO2: 28 mmol/L (ref 22–32)
Calcium: 9.4 mg/dL (ref 8.9–10.3)
Chloride: 98 mmol/L (ref 98–111)
Creatinine, Ser: 1.76 mg/dL — ABNORMAL HIGH (ref 0.61–1.24)
GFR, Estimated: 48 mL/min — ABNORMAL LOW (ref >60)
Glucose, Bld: 152 mg/dL — ABNORMAL HIGH (ref 70–99)
Potassium: 3.8 mmol/L (ref 3.5–5.1)
Sodium: 137 mmol/L (ref 135–145)

## 2024-04-10 LAB — LIPID PANEL
Cholesterol: 152 mg/dL (ref 0–200)
HDL: 47 mg/dL (ref >40)
LDL Cholesterol: 77 mg/dL (ref 0–99)
Total CHOL/HDL Ratio: 3.2 ratio
Triglycerides: 142 mg/dL (ref <150)
VLDL: 28 mg/dL (ref 0–40)

## 2024-04-10 LAB — BRAIN NATRIURETIC PEPTIDE: B Natriuretic Peptide: 15 pg/mL (ref 0.0–100.0)

## 2024-04-10 MED ORDER — HYDRALAZINE HCL 25 MG PO TABS: 12.5000 mg | ORAL_TABLET | Freq: Three times a day (TID) | ORAL | Status: DC

## 2024-04-10 MED ORDER — AMLODIPINE BESYLATE 5 MG PO TABS: 5.0000 mg | ORAL_TABLET | Freq: Every day | ORAL | 3 refills | Status: DC

## 2024-04-10 NOTE — Progress Notes (Signed)
 ADVANCED HEART FAILURE CLINIC NOTE  Referring Physician: Vincenza Greener*  Primary Care: [name], Jordan Bade, DO   HPI: Jordan Greer is a 43 y.o. male with HFrEF 2/2 NICM, HTN, OSA on CPAP, T2DM, and obesity presenting today to establish care. [name]' cardiac history dates back to February 2024 when he was admitted with acute onset shortness of breath.  During that admission he was found to be significantly hypertensive requiring multiple antihypertensive medications for control.  In addition he underwent right and left heart cath with which demonstrated preserved cardiac index and no significant coronary artery disease.  He had an echocardiogram with LVEF of 35%.  Patient was discharged home on GDMT. 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   Interval history: - Reports home Bps range from 119-122/80s; BP at PCP was 120/80s.  - He is not very active; however, has no reported limitations from underlying HFrecEF. He has no dyspnea, PND, LE edema, orthopnea. Compliant with all medications. Motivated to lose weight this ummer.   Activity level/exercise tolerance: NYHA II Orthopnea:  Sleeps on 2 pillows Paroxysmal noctural dyspnea: No Chest pain/pressure: No Orthostatic lightheadedness:  No Palpitations:  No Lower extremity edema:  No Presyncope/syncope:  No Cough:  No Current Outpatient Medications  Medication Sig Dispense Refill   amLODipine  (NORVASC ) 5 MG tablet Take 1 tablet (5 mg total) by mouth daily. 90 tablet 3   carvedilol  (COREG ) 25 MG tablet Take 2 tablets (50 mg total) by mouth 2 (two) times daily with a meal. 60 tablet 3   Continuous Glucose Receiver (FREESTYLE LIBRE 2 READER) DEVI Use as directed. 1 each 1   Continuous Glucose Sensor (FREESTYLE LIBRE 2 SENSOR) MISC Use as directed for continuous blood glucose monitoring. Change every 14 days 2 each 11   dapagliflozin  propanediol (FARXIGA ) 10 MG TABS tablet Take 1 tablet (10 mg)  by mouth daily. 30 tablet 11   glimepiride  (AMARYL ) 4 MG tablet Take 1 tablet (4 mg total) by mouth 2 (two) times daily. 180 tablet 3   hydrALAZINE  (APRESOLINE ) 25 MG tablet Take 1 tablet (25 mg total) by mouth 3 (three) times daily. 270 tablet 3   Ipratropium-Albuterol  (COMBIVENT ) 20-100 MCG/ACT AERS respimat Inhale 1 puff into the lungs every 6 (six) hours. (Patient not taking: Reported on 04/10/2024) 4 g 0   metFORMIN  (GLUCOPHAGE -XR) 500 MG 24 hr tablet Take 4 tablets (2,000 mg total) by mouth daily. 360 tablet 1   mupirocin  ointment (CENTANY ) 2 % APPLY A SMALL AMOUNT TO THE AFFECTED AREA BY TOPICAL ROUTE 3 TIMES PER DAY 22 g 1   OZEMPIC , 2 MG/DOSE, 8 MG/3ML SOPN Inject 2 mg into the skin once a week.     rosuvastatin  (CRESTOR ) 40 MG tablet Take 1 tablet (40 mg total) by mouth at bedtime. 90 tablet 3   sacubitril -valsartan  (ENTRESTO ) 97-103 MG Take 1 tablet by mouth 2 times daily. 60 tablet 3   Semaglutide , 2 MG/DOSE, (OZEMPIC , 2 MG/DOSE,) 8 MG/3ML SOPN Inject 2 mg into the skin once a week. (Patient not taking: Reported on 04/10/2024) 3 mL 6   Semaglutide , 2 MG/DOSE, (OZEMPIC , 2 MG/DOSE,) 8 MG/3ML SOPN Inject 2 mg into the skin once a week in the morning (Patient not taking: Reported on 04/10/2024) 3 mL 11   sildenafil  (VIAGRA ) 50 MG tablet Take 1 tablet (50 mg total) by mouth daily as needed for erectile dysfunction. (Patient not taking: Reported on 04/10/2024) 10 tablet 0   spironolactone  (  ALDACTONE ) 25 MG tablet Take 1 tablet (25 mg total) by mouth daily. 30 tablet 3   torsemide  (DEMADEX ) 20 MG tablet Take 1 tablet (20 mg total) by mouth daily as needed. 90 tablet 3   No current facility-administered medications for this visit.      PHYSICAL EXAM: Vitals:   04/10/24 0915  BP: (!) 137/98  Pulse: 84  SpO2: 97%   GENERAL: NAD Lungs- CTA CARDIAC:  JVP: 7 cm          Normal rate with regular rhythm. no murmur.  Pulses 2+. no edema.  ABDOMEN: Soft, non-tender, non-distended.   EXTREMITIES: Warm and well perfused.  NEUROLOGIC: No obvious FND   DATA REVIEW  ECG: 01/07/2023: Normal sinus rhythm as per my personal interpretation  ECHO: LVEF 35%, moderate LVH, normal RV function as per my personal interpretation  CATH: 12/25/22: No angiographically significant coronary artery disease.  Findings are consistent with nonischemic cardiomyopathy. Moderately elevated left heart, right heart, and pulmonary artery pressures. Normal Fick cardiac output/index.   ASSESSMENT & PLAN:  Heart failure with reduced ejection fraction Etiology of HF: Likely hypertensive cardiomyopathy. TSH today. Repeat TTE ordered today.  NYHA class / AHA Stage:IIB Volume status & Diuretics: Euvolemic, taking torsemide  20mg . Vasodilators:Entresto  97/103mg  BID, will aim to come off hydralazine . Start amlodipine  5mg  daily, wean hydralazine  to 12.5mg  TID. Follow up in pharmD clinic in 2 weeks to continue down titration of hydralazine  if BP allows.  Beta-Blocker: Continue coreg  37.5mg  TID.  MRA: Spironolactone  25 mg Cardiometabolic: Farxiga  10 mg daily Devices therapies & Valvulopathies: Not currently indicated Advanced therapies: Not currently indicated  2. Hypertension  -Now on Entresto  97/103 mg twice daily. - holding imdur  (wishes to start taking viagra ) - Will decrease hydralazine  to 12.5mg  TID today and add amlodipine  (did not start previously) - coreg  37.5mg  BID - repeat BMP today; follow up with pharmD in 2 week.s   3. T2DM -Well-controlled.  Hemoglobin A1c of 6 in February 2024.  Now on Farxiga  10 mg.  4. Obesity -Body mass index is 52.28 kg/m. -He has been on ozempic  for two years now; discussed the importance of weight loss with activity.   5. CKD -Continue Farxiga  -repeat BMP today  6. Sleep apnea - compliant with CPAP  7. Hyperlipidemia - LDL 60, HDL 36 on 12/24/22 - continue crestor  20mg  daily - Repeat lipid panel today  8. ED - continue viagra  20mg  PRN.    Jordan Greer Advanced Heart Failure Mechanical Circulatory Support

## 2024-04-10 NOTE — Patient Instructions (Addendum)
 Medication Changes:  START Amlodipine  5 mg Daily  DECREASE Hydralazine  to 12.5 mg (1/2 tab) Three times a day   Lab Work:  Labs are needed today: Please Go DOWN to LOWER LEVEL (LL) to have your blood work completed inside of [employer] office.  We will only call you if the results are abnormal or if the provider would like to make medication changes. Labs done today, your results will be available in MyChart, we will contact you for abnormal readings.   Testing/Procedures:  Your physician has requested that you have an echocardiogram. Echocardiography is a painless test that uses sound waves to create images of your heart. It provides your doctor with information about the size and shape of your heart and how well your heart's chambers and valves are working. This procedure takes approximately one hour. There are no restrictions for this procedure. Please do NOT wear cologne, perfume, aftershave, or lotions (deodorant is allowed). Please arrive 15 minutes prior to your appointment time. IN 3 MONTHS  Please note: We ask at that you not bring children with you during ultrasound (echo/ vascular) testing. Due to room size and safety concerns, children are not allowed in the ultrasound rooms during exams. Our front office staff cannot provide observation of children in our lobby area while testing is being conducted. An adult accompanying a patient to their appointment will only be allowed in the ultrasound room at the discretion of the ultrasound technician under special circumstances. We apologize for any inconvenience.  Special Instructions // Education:  Do the following things EVERYDAY: Weigh yourself in the morning before breakfast. Write it down and keep it in a log. Take your medicines as prescribed Eat low salt foods--Limit salt (sodium) to 2000 mg per day.  Stay as active as you can everyday Limit all fluids for the day to less than 2 liters   Follow-Up in:   2-3 weeks with our  heart failure pharmacist for med titration  3 MONTHS WITH AN ECHOCARDIOGRAM    If you have any questions or concerns before your next appointment please send us  a message through [facility] or call our office at [phone], If it is after office hours your call will be answered by our answering service and directed appropriately.     At the [facility], you and your health needs are our priority. We have a designated team specialized in the treatment of Heart Failure. This Care Team includes your primary Heart Failure Specialized Cardiologist (physician), Advanced Practice Providers (APPs- Physician Assistants and Nurse Practitioners), and Pharmacist who all work together to provide you with the care you need, when you need it.   You may see any of the following providers on your designated Care Team at your next follow up:  Dr. Jules Oar Dr. Peder Bourdon Dr. Alwin Baars Dr. Judyth Nunnery Nieves Bars, NP Ruddy Corral, [place] [address] [place], [place] Dennise Fitz, NP [place] Lee, NP Shawnee Dellen, NP Bevely Brush, PharmD

## 2024-04-22 ENCOUNTER — Telehealth: Payer: Self-pay | Admitting: Internal Medicine

## 2024-04-22 NOTE — Telephone Encounter (Signed)
 Called to confirm/remind patient of their appointment at the [facility] on 04/23/24.   Appointment:   [x] Confirmed  [] Left mess   [] No answer/No voice mail  [] VM Full/unable to leave message  [] Phone not in service  Patient reminded to bring all medications and/or complete list.  Confirmed patient has transportation. Gave directions, instructed to utilize valet parking.

## 2024-04-23 ENCOUNTER — Ambulatory Visit: Attending: Internal Medicine | Admitting: Pharmacist

## 2024-04-23 ENCOUNTER — Other Ambulatory Visit (HOSPITAL_COMMUNITY): Payer: Self-pay

## 2024-04-23 DIAGNOSIS — Z6841 Body Mass Index (BMI) 40.0 and over, adult: Secondary | ICD-10-CM | POA: Insufficient documentation

## 2024-04-23 DIAGNOSIS — Z79899 Other long term (current) drug therapy: Secondary | ICD-10-CM | POA: Insufficient documentation

## 2024-04-23 DIAGNOSIS — I428 Other cardiomyopathies: Secondary | ICD-10-CM | POA: Insufficient documentation

## 2024-04-23 DIAGNOSIS — E1122 Type 2 diabetes mellitus with diabetic chronic kidney disease: Secondary | ICD-10-CM | POA: Insufficient documentation

## 2024-04-23 DIAGNOSIS — N189 Chronic kidney disease, unspecified: Secondary | ICD-10-CM | POA: Diagnosis not present

## 2024-04-23 DIAGNOSIS — I110 Hypertensive heart disease with heart failure: Secondary | ICD-10-CM | POA: Diagnosis not present

## 2024-04-23 DIAGNOSIS — I5022 Chronic systolic (congestive) heart failure: Secondary | ICD-10-CM | POA: Insufficient documentation

## 2024-04-23 DIAGNOSIS — G4733 Obstructive sleep apnea (adult) (pediatric): Secondary | ICD-10-CM | POA: Insufficient documentation

## 2024-04-23 DIAGNOSIS — Z131 Encounter for screening for diabetes mellitus: Secondary | ICD-10-CM | POA: Diagnosis not present

## 2024-04-23 DIAGNOSIS — E669 Obesity, unspecified: Secondary | ICD-10-CM | POA: Insufficient documentation

## 2024-04-23 MED ORDER — CARVEDILOL 25 MG PO TABS
50.0000 mg | ORAL_TABLET | Freq: Two times a day (BID) | ORAL | 3 refills | Status: DC
  Filled 2024-04-23: qty 60, 15d supply, fill #0
  Filled 2024-04-23 – 2024-05-10 (×2): qty 60, 15d supply, fill #1
  Filled 2024-05-22: qty 60, 15d supply, fill #2
  Filled 2024-06-07: qty 60, 15d supply, fill #3

## 2024-04-23 NOTE — Progress Notes (Signed)
 Advanced Heart Failure Clinic Note  PCP: Luan Rumpf, DO  PCP-Cardiologist: None  HF-Cardiologist: Dr. Alwin Baars  HPI:  Jordan Greer is a 43 y.o. male with HFrEF 2/2 NICM, HTN, OSA on CPAP, T2DM, and obesity. Jordan Greer' cardiac history dates back to February 2024 when he was admitted with acute onset shortness of breath.  During that admission he was found to be significantly hypertensive requiring multiple medications for BP control. In addition he underwent right and left heart cath with which demonstrated preserved cardiac index and no significant coronary artery disease. He had an echocardiogram with LVEF of 35%.  Patient was discharged home on GDMT. 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    Seen 04/12/23 in AHF clinic. Reported feeling well. No medication changes made.   Follow-up TTE 04/2023 showed improved LVEF to 55-60%  on GDMT. Also with grade I diastolic dysfunction.  Seen 06/28/23 by Shawnee Dellen where BP was 170/100 mmHg. Taking hydralazine  BID rather than TID, likely was not taking Imdur  and reported missed doses of other medications due to work schedule. Instructed to increase hydralazine  back to TID and resume Imdur .  Seen 07/04/23 for sleep test. Started CPAP.  Seen by HF pharmacist 07/18/23. Hydralazine  and Imdur  were change to BiDil  and Farxiga  was resumed.   Seen by HF pharmacist 08/15/23. Complained of ED, so isosorbide  was stopped and Viagra  was added. Increased carvedilol  to 50 mg BID.  Seen by Dr. Bruce Caper on 11/22/23. Amlodipine  5 mg daily was added.  Seen by Dr. Bruce Caper on 04/10/24. BP was well controlled without starting amlodipine , so hydralazine  was decreased to 12.5 mg TID and amlodipine  was added.  Today Jordan Greer returns to [facility] Failure Clinic for pharmacist medication titration. Reports feeling good. Denies dizziness, lightheadedness, fatigue. chest pain, palpitations, SOB, LEE, orthopnea, orthostasis, and  PND. Reports being able to complete all activities of daily living (ADLs). Is not very active throughout the day. Weight at home has been trending down. Takes torsemide  20 mg essentially every day now. Reports difficultly taking it daily due to few stops on the drive from home to work. Appetite has been reduced secondary to Ozempic . Somewhat follows a low sodium diet. Patient has not been checking blood glucoses at home. BP at home is typically 110-120/80s.  Current HF Medications: -Carvedilol  25 mg BID -Entresto  97-103 mg BID -Spironolactone  25 mg daily -Farxiga  10 mg daily -Hydralazine  25 mg daily -Torsemide  20 mg prn  Has the patient been experiencing any side effects to the medications prescribed? Yes, sexual dysfunction. Reports Viagra  did not help.  Does the patient have any problems obtaining medications due to transportation or finances? No. Enrolled in [facility].  Understanding of regimen: Good  Understanding of indications: Good  Potential of adherence: Good. Reports taking medications as prescribed.  Patient understands to avoid NSAIDs. Patient understands to avoid decongestants.  Pertinent Lab Values: Creatinine, Ser  Date Value Ref Range Status  04/10/2024 1.76 (H) 0.61 - 1.24 mg/dL Final   BUN  Date Value Ref Range Status  04/10/2024 23 (H) 6 - 20 mg/dL Final   Potassium  Date Value Ref Range Status  04/10/2024 3.8 3.5 - 5.1 mmol/L Final   Sodium  Date Value Ref Range Status  04/10/2024 137 135 - 145 mmol/L Final   B Natriuretic Peptide  Date Value Ref Range Status  04/10/2024 15.0 0.0 - 100.0 pg/mL Final    Comment:    Performed at [facility], [address] [place], [place] [address]   Magnesium  Date Value Ref Range Status  12/24/2022 2.1 1.7 - 2.4 mg/dL Final    Comment:    Performed at [facility], [address] [place], [place] [address]   TSH  Date Value Ref Range Status  04/12/2023 1.855 0.350  - 4.500 uIU/mL Final    Comment:    Performed by a 3rd Generation assay with a functional sensitivity of <=0.01 uIU/mL. Performed at [facility], [address], [place], [place] [address]     Vital Signs: Today's Vitals   04/23/24 1303  BP: 106/86  Pulse: 93  SpO2: 96%  Weight: (!) 351 lb 9.6 oz (159.5 kg)     Assessment/Plan: Heart failure with reduced ejection fraction Etiology of HF: Likely hypertensive cardiomyopathy. Repeat TTE showed improvement in LVEF from 35-40% to 55-60%. NYHA class / AHA Stage:IIB Volume status & Diuretics: Euvolemic, taking torsemide  20mg  daily. Weight down 3 lbs from last visit. Reports some improvement in exercise this past week. He is motivated to make changes to lifestyle. Vasodilators:BP well controlled today. Continue Entresto  97/103mg  BID. Stop hydralazine  as outlined by Dr. Bruce Caper. Patient took a 25 mg dose shortly before coming into clinic. Continue amlodipine  5 mg daily. Beta-Blocker: HR elevated on carvedilol  25 mg twice daily. Will increase to 50 mg twice daily. Patient has not successfully changed this medication as instructed. MRA: Continue spironolactone  25 mg Cardiometabolic: Continue Farxiga  10 mg daily. Reports CBGs have been stable. Devices therapies & Valvulopathies: Not currently indicated Advanced therapies: Not currently indicated -Significant amount of time spent discussing GDMT. Talked through the TRED-HF trial and impact of weight loss and diet on BP and BG.   2. Hypertension  -See above   3. T2DM -Well-controlled.  Hemoglobin A1c of 6.8 in August 2024.  Continue Farxiga  10 mg daily, Ozempic  2 mg weekly. May require reduction or stopping of glimepiride  in the future if significant weight loss is achieved.     4. Obesity -Body mass index is 51.92 kg/m. -Started on Ozempic  several months ago -Discussed weight loss once again today.    5. CKD -Continue Farxiga     6. Sleep apnea - Using CPAP  -Continue  semaglutide    7. Hyperlipidemia - LDL 77 03/2024, repeat in 89m - Continue Crestor  20mg  daily  Follow up: August with CHF physician  Thank you for involving pharmacy in this patient's care.  Bevely Brush, PharmD, BCPS Phone - [phone] Clinical Pharmacist 04/23/2024 1:49 PM

## 2024-04-23 NOTE — Patient Instructions (Addendum)
 It was a pleasure seeing you today!  MEDICATIONS: -STOP hydralazine  -INCREASE carvedilol  to 2 tablets twice daily -Call if you have questions about your medications.  LABS: -We will call you if your labs need attention.  NEXT APPOINTMENT: Return to clinic in August as discussed with Dr. Bruce Caper  In general, to take care of your heart failure: -Limit your fluid intake to 2 Liters (half-gallon) per day.   -Limit your salt intake to ideally 2-3 grams (2000-3000 mg) per day. -Weigh yourself daily and record, and bring that "weight diary" to your next appointment.  (Weight gain of 2-3 pounds in 1 day typically means fluid weight.) -The medications for your heart are to help your heart and help you live longer.   -Please contact us  before stopping any of your heart medications.  Call the clinic at [phone] with questions or to reschedule future appointments.

## 2024-04-24 ENCOUNTER — Other Ambulatory Visit (HOSPITAL_COMMUNITY): Payer: Self-pay

## 2024-04-24 ENCOUNTER — Other Ambulatory Visit: Payer: Self-pay

## 2024-04-24 DIAGNOSIS — E1122 Type 2 diabetes mellitus with diabetic chronic kidney disease: Secondary | ICD-10-CM | POA: Diagnosis not present

## 2024-04-24 DIAGNOSIS — N183 Chronic kidney disease, stage 3 unspecified: Secondary | ICD-10-CM | POA: Diagnosis not present

## 2024-04-24 DIAGNOSIS — I509 Heart failure, unspecified: Secondary | ICD-10-CM | POA: Diagnosis not present

## 2024-04-24 MED ORDER — TRIAMCINOLONE ACETONIDE 0.1 % EX CREA
1.0000 | TOPICAL_CREAM | Freq: Two times a day (BID) | CUTANEOUS | 1 refills | Status: DC
  Filled 2024-04-24: qty 80, 30d supply, fill #0
  Filled 2024-04-27: qty 80, 20d supply, fill #0

## 2024-04-27 ENCOUNTER — Other Ambulatory Visit (HOSPITAL_COMMUNITY): Payer: Self-pay

## 2024-04-27 ENCOUNTER — Encounter (HOSPITAL_COMMUNITY): Payer: Self-pay

## 2024-04-30 ENCOUNTER — Other Ambulatory Visit

## 2024-05-11 ENCOUNTER — Other Ambulatory Visit: Payer: Self-pay

## 2024-05-11 ENCOUNTER — Other Ambulatory Visit (HOSPITAL_COMMUNITY): Payer: Self-pay

## 2024-05-23 ENCOUNTER — Other Ambulatory Visit (HOSPITAL_COMMUNITY): Payer: Self-pay

## 2024-05-28 ENCOUNTER — Other Ambulatory Visit: Payer: Self-pay

## 2024-05-30 ENCOUNTER — Other Ambulatory Visit: Payer: Self-pay

## 2024-05-30 ENCOUNTER — Emergency Department
Admission: EM | Admit: 2024-05-30 | Discharge: 2024-05-30 | Disposition: A | Discharge status: Home / Self Care | Attending: Emergency Medicine | Admitting: Emergency Medicine

## 2024-05-30 DIAGNOSIS — N189 Chronic kidney disease, unspecified: Secondary | ICD-10-CM | POA: Insufficient documentation

## 2024-05-30 DIAGNOSIS — T7803XA Anaphylactic reaction due to other fish, initial encounter: Secondary | ICD-10-CM | POA: Insufficient documentation

## 2024-05-30 DIAGNOSIS — E1122 Type 2 diabetes mellitus with diabetic chronic kidney disease: Secondary | ICD-10-CM | POA: Diagnosis not present

## 2024-05-30 DIAGNOSIS — I130 Hypertensive heart and chronic kidney disease with heart failure and stage 1 through stage 4 chronic kidney disease, or unspecified chronic kidney disease: Secondary | ICD-10-CM | POA: Insufficient documentation

## 2024-05-30 DIAGNOSIS — T781XXA Other adverse food reactions, not elsewhere classified, initial encounter: Secondary | ICD-10-CM

## 2024-05-30 DIAGNOSIS — I509 Heart failure, unspecified: Secondary | ICD-10-CM | POA: Insufficient documentation

## 2024-05-30 DIAGNOSIS — T7840XA Allergy, unspecified, initial encounter: Secondary | ICD-10-CM | POA: Diagnosis not present

## 2024-05-30 MED ORDER — PREDNISONE 50 MG PO TABS: ORAL_TABLET | ORAL | 0 refills | Status: DC

## 2024-05-30 MED ORDER — PREDNISONE 20 MG PO TABS
60.0000 mg | ORAL_TABLET | Freq: Once | ORAL | Status: AC
  Administered 2024-05-30: 60 mg via ORAL
  Filled 2024-05-30: qty 3

## 2024-05-30 MED ORDER — FAMOTIDINE 20 MG PO TABS
40.0000 mg | ORAL_TABLET | Freq: Once | ORAL | Status: AC
  Administered 2024-05-30: 40 mg via ORAL
  Filled 2024-05-30: qty 2

## 2024-05-30 MED ORDER — DIPHENHYDRAMINE HCL 25 MG PO CAPS
50.0000 mg | ORAL_CAPSULE | Freq: Once | ORAL | Status: AC
  Administered 2024-05-30: 50 mg via ORAL
  Filled 2024-05-30: qty 2

## 2024-05-30 NOTE — Discharge Instructions (Signed)
 I believe you had an allergic reaction to the fish today.  Because this is something that you ate it may take a couple days for it to fully clear out of your system.  Please take the prednisone  once a day for the next 5 days.  You can also take Benadryl  and nondrowsy allergy medication like Claritin, Zyrtec or Allegra as needed.  Return to the emergency department with any worsening symptoms like tongue swelling, lip swelling, difficulty swallowing or any difficulty breathing.

## 2024-05-30 NOTE — ED Provider Notes (Signed)
 [facility] Provider Note    Event Date/Time   First MD Initiated Contact with Patient 05/30/24 1928     (approximate)   History   Allergic Reaction   HPI  Jordan Greer is a 43 y.o. male with PMH of diabetes, hypertension, CHF, CKD who presents for evaluation of an allergic reaction.  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  Patient states that he threw up right after eating the fish.  He had some increased congestion which she described as tightness in his nostrils and itchy throat.  Patient's wife thought that he had some puffiness underneath both eyes.  He denies lip swelling, tongue swelling, difficulty swallowing or difficulty breathing.  He has never had an anaphylactic reaction before.      Physical Exam   Triage Vital Signs: ED Triage Vitals [05/30/24 1920]  Encounter Vitals Group     BP (!) 136/96     Girls Systolic BP Percentile      Girls Diastolic BP Percentile      Boys Systolic BP Percentile      Boys Diastolic BP Percentile      Pulse Rate 93     Resp 16     Temp 98.6 F (37 C)     Temp Source Oral     SpO2 95 %     Weight (!) 348 lb (157.9 kg)     Height 5' 9 (1.753 m)     Head Circumference      Peak Flow      Pain Score 0     Pain Loc      Pain Education      Exclude from Growth Chart     Most recent vital signs: Vitals:   05/30/24 1920  BP: (!) 136/96  Pulse: 93  Resp: 16  Temp: 98.6 F (37 C)  SpO2: 95%   General: Awake, no distress.  CV:  Good peripheral perfusion.  RRR. Resp:  Normal effort.  CTAB.  Tolerating secretions and speaking full sentences. Abd:  No distention.  Other:  No significant swelling noted to the patient's face.   ED Results / Procedures / Treatments   Labs (all labs ordered are listed, but only abnormal results are displayed) Labs Reviewed - No data to display    PROCEDURES:  Critical Care performed:  No  Procedures   MEDICATIONS ORDERED IN ED: Medications  diphenhydrAMINE  (BENADRYL ) capsule 50 mg (50 mg Oral Given 05/30/24 1924)  famotidine  (PEPCID ) tablet 40 mg (40 mg Oral Given 05/30/24 2010)  predniSONE  (DELTASONE ) tablet 60 mg (60 mg Oral Given 05/30/24 2010)     IMPRESSION / MDM / ASSESSMENT AND PLAN / ED COURSE  I reviewed the triage vital signs and the nursing notes.                             43 year old male presents for evaluation of an allergic reaction.  Blood pressure is a little elevated otherwise vital signs are stable.  Patient NAD on exam.  Differential diagnosis includes, but is not limited to, allergic reaction, urticaria, anaphylaxis, viral infection.  Patient's presentation is most consistent with acute illness / injury with system symptoms.  Suspect that patient did have an allergic reaction to the fish.  No suspicion for anaphylaxis at this time as patient does not have  any difficulty breathing.  He was given Benadryl  in triage and reports that his symptoms continue to improve after receiving the Benadryl .  Will treat him with additional famotidine  and prednisone  and monitor for continued improvement.  If he continues to improve feel he be stable for outpatient management.  Clinical Course as of 05/30/24 2056  Sat May 30, 2024  2054 Patient reports that he continues to feel better.  Will send a short course of steroids for him to take as an outpatient.  Encouraged him to take nondrowsy allergy medication and Benadryl  as needed.  We discussed signs and symptoms of anaphylaxis to watch out for and I encouraged strict return precautions.  Patient and wife voiced understanding, all questions were answered and he was stable at discharge. [LD]    Clinical Course User Index [LD] Cleaster Tinnie LABOR, PA-C     FINAL CLINICAL IMPRESSION(S) / ED DIAGNOSES   Final diagnoses:  Allergic reaction to food, initial encounter     Rx / DC Orders   ED Discharge Orders           Ordered    predniSONE  (DELTASONE ) 50 MG tablet        05/30/24 2055             Note:  This document was prepared using Dragon voice recognition software and may include unintentional dictation errors.   Cleaster Tinnie LABOR, PA-C 05/30/24 2056    Jossie Artist POUR, MD 05/31/24 2003

## 2024-05-30 NOTE — ED Notes (Signed)
 PT states he had some fish about 5:30 pm today when suddenly he became very nauseous and threw up. States that he felt nasal congestion and an itch in his throat. Airway is open and patent at this time. Pt is not noted to have any edema or hives.

## 2024-05-30 NOTE — ED Triage Notes (Signed)
 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 Pt reports tightness in nostrils nasal congestion, itchy throat, pt talks in complete sentences no respiratory distress noted

## 2024-06-02 DIAGNOSIS — G4733 Obstructive sleep apnea (adult) (pediatric): Secondary | ICD-10-CM | POA: Diagnosis not present

## 2024-06-08 ENCOUNTER — Other Ambulatory Visit: Payer: Self-pay

## 2024-06-08 ENCOUNTER — Other Ambulatory Visit (HOSPITAL_COMMUNITY): Payer: Self-pay

## 2024-06-22 ENCOUNTER — Other Ambulatory Visit (HOSPITAL_COMMUNITY): Payer: Self-pay

## 2024-06-22 ENCOUNTER — Other Ambulatory Visit: Payer: Self-pay | Admitting: Family

## 2024-06-22 ENCOUNTER — Other Ambulatory Visit: Payer: Self-pay | Admitting: Cardiology

## 2024-06-22 MED ORDER — CARVEDILOL 25 MG PO TABS
37.5000 mg | ORAL_TABLET | Freq: Three times a day (TID) | ORAL | 3 refills | Status: DC
  Filled 2024-06-22: qty 60, 14d supply, fill #0
  Filled 2024-07-25: qty 60, 14d supply, fill #1
  Filled 2024-08-07: qty 60, 14d supply, fill #2
  Filled 2024-09-11: qty 60, 14d supply, fill #3

## 2024-06-22 MED ORDER — SPIRONOLACTONE 25 MG PO TABS
25.0000 mg | ORAL_TABLET | Freq: Every day | ORAL | 3 refills | Status: AC
  Filled 2024-06-22: qty 30, 30d supply, fill #0
  Filled 2024-07-25: qty 30, 30d supply, fill #1
  Filled 2024-09-11: qty 30, 30d supply, fill #2
  Filled 2024-09-25 – 2024-10-12 (×2): qty 30, 30d supply, fill #3

## 2024-07-03 DIAGNOSIS — G4733 Obstructive sleep apnea (adult) (pediatric): Secondary | ICD-10-CM | POA: Diagnosis not present

## 2024-07-06 ENCOUNTER — Other Ambulatory Visit: Payer: Self-pay

## 2024-07-06 ENCOUNTER — Other Ambulatory Visit (HOSPITAL_COMMUNITY): Payer: Self-pay

## 2024-07-24 ENCOUNTER — Other Ambulatory Visit: Payer: Self-pay

## 2024-07-25 ENCOUNTER — Other Ambulatory Visit: Payer: Self-pay | Admitting: Family

## 2024-07-25 ENCOUNTER — Other Ambulatory Visit: Payer: Self-pay | Admitting: Internal Medicine

## 2024-07-25 ENCOUNTER — Other Ambulatory Visit (HOSPITAL_COMMUNITY): Payer: Self-pay

## 2024-07-27 ENCOUNTER — Other Ambulatory Visit: Payer: Self-pay

## 2024-07-27 MED ORDER — TORSEMIDE 20 MG PO TABS
20.0000 mg | ORAL_TABLET | Freq: Every day | ORAL | 1 refills | Status: AC | PRN
  Filled 2024-07-27: qty 90, 90d supply, fill #0

## 2024-07-28 ENCOUNTER — Other Ambulatory Visit: Payer: Self-pay

## 2024-07-28 ENCOUNTER — Telehealth: Payer: Self-pay

## 2024-07-28 MED ORDER — SACUBITRIL-VALSARTAN 97-103 MG PO TABS
1.0000 | ORAL_TABLET | Freq: Two times a day (BID) | ORAL | 3 refills | Status: AC
  Filled 2024-07-28: qty 60, 30d supply, fill #0
  Filled 2024-08-18 – 2024-08-20 (×2): qty 60, 30d supply, fill #1
  Filled 2024-10-12: qty 60, 30d supply, fill #2
  Filled 2024-11-14 – 2024-11-15 (×2): qty 60, 30d supply, fill #3

## 2024-07-28 NOTE — Telephone Encounter (Signed)
 LVM for patient to call the clinic to schedule overdue appt and echo.

## 2024-08-03 DIAGNOSIS — G4733 Obstructive sleep apnea (adult) (pediatric): Secondary | ICD-10-CM | POA: Diagnosis not present

## 2024-08-07 ENCOUNTER — Ambulatory Visit

## 2024-08-07 ENCOUNTER — Other Ambulatory Visit (HOSPITAL_COMMUNITY): Payer: Self-pay

## 2024-08-18 ENCOUNTER — Other Ambulatory Visit (HOSPITAL_COMMUNITY): Payer: Self-pay

## 2024-08-18 ENCOUNTER — Other Ambulatory Visit: Payer: Self-pay

## 2024-08-19 ENCOUNTER — Other Ambulatory Visit (HOSPITAL_COMMUNITY): Payer: Self-pay

## 2024-09-11 ENCOUNTER — Other Ambulatory Visit (HOSPITAL_COMMUNITY): Payer: Self-pay

## 2024-09-13 ENCOUNTER — Other Ambulatory Visit (HOSPITAL_COMMUNITY): Payer: Self-pay

## 2024-09-14 ENCOUNTER — Other Ambulatory Visit: Payer: Self-pay

## 2024-09-14 ENCOUNTER — Other Ambulatory Visit (HOSPITAL_COMMUNITY): Payer: Self-pay

## 2024-09-14 MED ORDER — OZEMPIC (2 MG/DOSE) 8 MG/3ML ~~LOC~~ SOPN
2.0000 mg | PEN_INJECTOR | SUBCUTANEOUS | 11 refills | Status: AC
  Filled 2024-09-14: qty 3, 28d supply, fill #0
  Filled 2024-10-16: qty 3, 28d supply, fill #1

## 2024-09-17 DIAGNOSIS — E785 Hyperlipidemia, unspecified: Secondary | ICD-10-CM | POA: Diagnosis not present

## 2024-09-17 DIAGNOSIS — E559 Vitamin D deficiency, unspecified: Secondary | ICD-10-CM | POA: Diagnosis not present

## 2024-09-17 DIAGNOSIS — E1122 Type 2 diabetes mellitus with diabetic chronic kidney disease: Secondary | ICD-10-CM | POA: Diagnosis not present

## 2024-09-17 DIAGNOSIS — Z0000 Encounter for general adult medical examination without abnormal findings: Secondary | ICD-10-CM | POA: Diagnosis not present

## 2024-09-24 ENCOUNTER — Ambulatory Visit
Admission: RE | Admit: 2024-09-24 | Discharge: 2024-09-24 | Disposition: A | Discharge status: Home / Self Care | Source: Ambulatory Visit | Attending: Cardiology | Admitting: Cardiology

## 2024-09-24 DIAGNOSIS — N189 Chronic kidney disease, unspecified: Secondary | ICD-10-CM | POA: Insufficient documentation

## 2024-09-24 DIAGNOSIS — I130 Hypertensive heart and chronic kidney disease with heart failure and stage 1 through stage 4 chronic kidney disease, or unspecified chronic kidney disease: Secondary | ICD-10-CM | POA: Diagnosis not present

## 2024-09-24 DIAGNOSIS — I5032 Chronic diastolic (congestive) heart failure: Secondary | ICD-10-CM | POA: Diagnosis not present

## 2024-09-24 LAB — ECHOCARDIOGRAM COMPLETE
Area-P 1/2: 3.74 cm2
S' Lateral: 2.6 cm

## 2024-09-24 MED ORDER — PERFLUTREN LIPID MICROSPHERE
1.0000 mL | INTRAVENOUS | Status: AC | PRN
  Administered 2024-09-24: 3 mL via INTRAVENOUS

## 2024-09-25 ENCOUNTER — Other Ambulatory Visit: Payer: Self-pay

## 2024-09-25 ENCOUNTER — Other Ambulatory Visit (HOSPITAL_COMMUNITY): Payer: Self-pay

## 2024-09-25 ENCOUNTER — Other Ambulatory Visit: Payer: Self-pay | Admitting: Cardiology

## 2024-09-25 DIAGNOSIS — I509 Heart failure, unspecified: Secondary | ICD-10-CM | POA: Diagnosis not present

## 2024-09-25 DIAGNOSIS — Z0000 Encounter for general adult medical examination without abnormal findings: Secondary | ICD-10-CM | POA: Diagnosis not present

## 2024-09-25 DIAGNOSIS — Z23 Encounter for immunization: Secondary | ICD-10-CM | POA: Diagnosis not present

## 2024-09-25 DIAGNOSIS — E1122 Type 2 diabetes mellitus with diabetic chronic kidney disease: Secondary | ICD-10-CM | POA: Diagnosis not present

## 2024-09-25 MED ORDER — SILDENAFIL CITRATE 50 MG PO TABS
50.0000 mg | ORAL_TABLET | Freq: Every day | ORAL | 11 refills | Status: AC
  Filled 2024-09-25 – 2024-10-16 (×2): qty 10, 10d supply, fill #0
  Filled 2024-11-10: qty 10, 10d supply, fill #1

## 2024-09-25 MED ORDER — SILDENAFIL CITRATE 50 MG PO TABS
50.0000 mg | ORAL_TABLET | Freq: Every day | ORAL | 0 refills | Status: AC
  Filled 2024-09-25: qty 10, 10d supply, fill #0

## 2024-10-06 ENCOUNTER — Other Ambulatory Visit (HOSPITAL_COMMUNITY): Payer: Self-pay

## 2024-10-07 ENCOUNTER — Encounter: Admitting: Cardiology

## 2024-10-08 ENCOUNTER — Telehealth: Payer: Self-pay | Admitting: Family

## 2024-10-08 NOTE — Telephone Encounter (Signed)
 Called to confirm/remind patient of their appointment at the [facility] Failure Clinic on 10/09/24.   Appointment:   [x] Confirmed  [] Left mess   [] No answer/No voice mail  [] VM Full/unable to leave message  [] Phone not in service  Patient reminded to bring all medications and/or complete list.  Confirmed patient has transportation. Gave directions, instructed to utilize valet parking.

## 2024-10-09 ENCOUNTER — Encounter: Payer: Self-pay | Admitting: Family

## 2024-10-09 ENCOUNTER — Ambulatory Visit: Attending: Family | Admitting: Family

## 2024-10-09 VITALS — BP 133/90 | HR 100 | Wt 349.0 lb

## 2024-10-09 DIAGNOSIS — E1169 Type 2 diabetes mellitus with other specified complication: Secondary | ICD-10-CM | POA: Diagnosis not present

## 2024-10-09 DIAGNOSIS — I5032 Chronic diastolic (congestive) heart failure: Secondary | ICD-10-CM | POA: Diagnosis not present

## 2024-10-09 DIAGNOSIS — Z7985 Long-term (current) use of injectable non-insulin antidiabetic drugs: Secondary | ICD-10-CM | POA: Diagnosis not present

## 2024-10-09 DIAGNOSIS — R5383 Other fatigue: Secondary | ICD-10-CM | POA: Diagnosis not present

## 2024-10-09 DIAGNOSIS — I428 Other cardiomyopathies: Secondary | ICD-10-CM | POA: Insufficient documentation

## 2024-10-09 DIAGNOSIS — E1122 Type 2 diabetes mellitus with diabetic chronic kidney disease: Secondary | ICD-10-CM | POA: Diagnosis not present

## 2024-10-09 DIAGNOSIS — G4733 Obstructive sleep apnea (adult) (pediatric): Secondary | ICD-10-CM

## 2024-10-09 DIAGNOSIS — Z7984 Long term (current) use of oral hypoglycemic drugs: Secondary | ICD-10-CM | POA: Diagnosis not present

## 2024-10-09 DIAGNOSIS — N1831 Chronic kidney disease, stage 3a: Secondary | ICD-10-CM | POA: Diagnosis not present

## 2024-10-09 DIAGNOSIS — E785 Hyperlipidemia, unspecified: Secondary | ICD-10-CM | POA: Diagnosis not present

## 2024-10-09 DIAGNOSIS — E669 Obesity, unspecified: Secondary | ICD-10-CM

## 2024-10-09 DIAGNOSIS — E66813 Obesity, class 3: Secondary | ICD-10-CM

## 2024-10-09 DIAGNOSIS — I130 Hypertensive heart and chronic kidney disease with heart failure and stage 1 through stage 4 chronic kidney disease, or unspecified chronic kidney disease: Secondary | ICD-10-CM | POA: Diagnosis not present

## 2024-10-09 DIAGNOSIS — N189 Chronic kidney disease, unspecified: Secondary | ICD-10-CM | POA: Insufficient documentation

## 2024-10-09 DIAGNOSIS — I5022 Chronic systolic (congestive) heart failure: Secondary | ICD-10-CM | POA: Insufficient documentation

## 2024-10-09 DIAGNOSIS — E782 Mixed hyperlipidemia: Secondary | ICD-10-CM

## 2024-10-09 DIAGNOSIS — I10 Essential (primary) hypertension: Secondary | ICD-10-CM | POA: Diagnosis not present

## 2024-10-09 DIAGNOSIS — Z79899 Other long term (current) drug therapy: Secondary | ICD-10-CM | POA: Insufficient documentation

## 2024-10-09 NOTE — Progress Notes (Signed)
 ADVANCED HEART FAILURE CLINIC NOTE   Referring Physician: Landon Ozell Fortis*  Primary Care: [name], Michael Thomas, DO  Chief complaint: fatigue   HPI: Jordan Greer is a 43 y.o. male with HFrEF 2/2 NICM, HTN, OSA on CPAP, T2DM, and obesity presenting today to establish care. Jordan Greer' cardiac history dates back to February 2024 when he was admitted with acute onset shortness of breath.  During that admission he was found to be significantly hypertensive requiring multiple antihypertensive medications for control.  In addition he underwent right and left heart cath with which demonstrated preserved cardiac index and no significant coronary artery disease.  He had an echocardiogram with LVEF of 35%.  Patient was discharged home on GDMT. 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   Was in the ED 06/02/24 due to allergic reaction after eating fish. Treated and then released.   He presents today for a HF follow-up visit with a chief complaint of minimal fatigue. Specifically denies any shortness of breath, chest pain, palpitations, abdominal distention, pedal edema or dizziness. Wearing CPAP 2-3 nights / week as he says that some nights, he's too tired to mess with it. Also gets busy and forgets to clean it so he doesn't want to wear it without cleaning it.   ROS: All systems negative except what is listed in HPI, PMH and Problem List  Current Outpatient Medications  Medication Sig Dispense Refill   amLODipine  (NORVASC ) 5 MG tablet Take 1 tablet (5 mg total) by mouth daily. 90 tablet 3   carvedilol  (COREG ) 25 MG tablet Take 1.5 tablets (37.5 mg total) by mouth 3 (three) times daily. 60 tablet 3   Continuous Glucose Receiver (FREESTYLE LIBRE 2 READER) DEVI Use as directed. 1 each 1   Continuous Glucose Sensor (FREESTYLE LIBRE 2 SENSOR) MISC Use as directed for continuous blood glucose monitoring. Change every 14 days 2 each 11   dapagliflozin  propanediol  (FARXIGA ) 10 MG TABS tablet Take 1 tablet (10 mg) by mouth daily. 30 tablet 11   glimepiride  (AMARYL ) 4 MG tablet Take 1 tablet (4 mg total) by mouth 2 (two) times daily. 180 tablet 3   Ipratropium-Albuterol  (COMBIVENT ) 20-100 MCG/ACT AERS respimat Inhale 1 puff into the lungs every 6 (six) hours. (Patient not taking: Reported on 11/22/2023) 4 g 0   metFORMIN  (GLUCOPHAGE -XR) 500 MG 24 hr tablet Take 4 tablets (2,000 mg total) by mouth daily. 360 tablet 1   mupirocin  ointment (CENTANY ) 2 % APPLY A SMALL AMOUNT TO THE AFFECTED AREA BY TOPICAL ROUTE 3 TIMES PER DAY 22 g 1   OZEMPIC , 2 MG/DOSE, 8 MG/3ML SOPN Inject 2 mg into the skin once a week.     predniSONE  (DELTASONE ) 50 MG tablet 1 tablet/day for 5 days. 5 tablet 0   rosuvastatin  (CRESTOR ) 40 MG tablet Take 1 tablet (40 mg total) by mouth at bedtime. 90 tablet 3   sacubitril -valsartan  (ENTRESTO ) 97-103 MG Take 1 tablet by mouth 2 times daily. 60 tablet 3   Semaglutide , 2 MG/DOSE, (OZEMPIC , 2 MG/DOSE,) 8 MG/3ML SOPN Inject 2 mg into the skin once a week in the morning 3 mL 11   Semaglutide , 2 MG/DOSE, (OZEMPIC , 2 MG/DOSE,) 8 MG/3ML SOPN Inject 2 mg into the skin once a week. 3 mL 11   sildenafil  (VIAGRA ) 50 MG tablet Take 1 tablet (50 mg total) by mouth daily as directed. 10 tablet 11   sildenafil  (VIAGRA ) 50 MG tablet Take 1 tablet (50 mg  total) by mouth daily as directed. 10 tablet 0   spironolactone  (ALDACTONE ) 25 MG tablet Take 1 tablet (25 mg total) by mouth daily. 30 tablet 3   torsemide  (DEMADEX ) 20 MG tablet Take 1 tablet (20 mg total) by mouth daily as needed. 90 tablet 1   triamcinolone  cream (KENALOG ) 0.1 % Apply a thin layer topically to the affected areas 2 (two) times daily. 80 g 1   No current facility-administered medications for this visit.   Vitals:   10/09/24 1029  BP: (!) 133/90  Pulse: 100  SpO2: 98%  Weight: (!) 349 lb (158.3 kg)   Wt Readings from Last 3 Encounters:  10/09/24 (!) 349 lb (158.3 kg)  05/30/24 (!) 348  lb (157.9 kg)  04/23/24 (!) 351 lb 9.6 oz (159.5 kg)   Lab Results  Component Value Date   CREATININE 1.76 (H) 04/10/2024   CREATININE 1.81 (H) 11/22/2023   CREATININE 1.80 (H) 08/15/2023     PHYSICAL EXAM:  General: Well appearing obese male.   Cor: No JVD. Regular rhythm, rate.  Lungs: clear Abdomen: soft, nontender, nondistended. Extremities: no edema Neuro:. Affect pleasant   DATA REVIEW  ECG: 01/07/2023: Normal sinus rhythm as per my personal interpretation  ECHO: 12/24/22: LVEF 35%, moderate LVH, normal RV function as per my personal interpretation 05/10/23: LVEF 55-60%, mild LVH, normal RV 09/24/24: LVEF 55-60%, mild LVH, G1DD, normal RV, normal IVC  CATH: 12/25/22: No angiographically significant coronary artery disease.  Findings are consistent with nonischemic cardiomyopathy. Moderately elevated left heart, right heart, and pulmonary artery pressures. Normal Fick cardiac output/index.   ASSESSMENT & PLAN:  Heart failure with now preserved ejection fraction Etiology of HF: Likely hypertensive cardiomyopathy.  NYHA class / AHA Stage:IIB Volume status & Diuretics: Euvolemic, taking torsemide  20mg  PRN. He estimates that he takes it 1-2 times / week. Vasodilators:Entresto  97/103mg  BID Beta-Blocker: Continue coreg  50mg  BID.  MRA: Spironolactone  25 mg Cardiometabolic: Farxiga  10 mg daily Devices therapies & Valvulopathies: Not currently indicated Advanced therapies: Not currently indicated  2. Hypertension  - Entresto  97/103 mg twice daily. - Continue amlodipine  5mg  daily - coreg  50mg  BID - BMET 04/10/24 reviewed: sodium 137, potassium 3.8, creatinine 1.76, GFR. Will request most recent labs from PCP office  3. T2DM (managed by PCP) - Well-controlled.  Hemoglobin A1c of 6.8% in August 2024. On Farxiga  10 mg.  4. Obesity - Continue ozempic  2mg  weekly - weight is down 5 pounds  5. CKD - Continue Farxiga  10mg  daily - he says that his PCP just did lab work in  last 1-2 weeks. Will have staff call to get results faxed to us .   6. Sleep apnea - Wearing CPAP 2-3 nights / week as he says that he doesn't always want to clean it or he gets too tired to put it on - Discussed the importance of wearing it every night so that his heart function remains good  7. Hyperlipidemia - LDL 04/10/24 was 77 (improved from 127 in January 2025) - continue crestor  40mg  daily   Return in 4 months, sooner if needed.   I spent 36 minutes reviewing records, interviewing/ examing patient and managing plan/ orders.   Ellouise DELENA Class [facility] 10/09/24

## 2024-10-09 NOTE — Patient Instructions (Signed)
 It was good to see you today!  Try to wear your CPAP more nights

## 2024-10-12 ENCOUNTER — Other Ambulatory Visit (HOSPITAL_COMMUNITY): Payer: Self-pay

## 2024-10-16 ENCOUNTER — Other Ambulatory Visit (HOSPITAL_COMMUNITY): Payer: Self-pay

## 2024-10-17 ENCOUNTER — Other Ambulatory Visit (HOSPITAL_COMMUNITY): Payer: Self-pay

## 2024-10-18 DIAGNOSIS — G4733 Obstructive sleep apnea (adult) (pediatric): Secondary | ICD-10-CM | POA: Diagnosis not present

## 2024-10-19 ENCOUNTER — Other Ambulatory Visit: Payer: Self-pay

## 2024-10-19 ENCOUNTER — Other Ambulatory Visit (HOSPITAL_COMMUNITY): Payer: Self-pay

## 2024-10-19 MED ORDER — SACUBITRIL-VALSARTAN 97-103 MG PO TABS
1.0000 | ORAL_TABLET | Freq: Two times a day (BID) | ORAL | 1 refills | Status: AC
  Filled 2024-10-19 – 2024-11-10 (×2): qty 180, 90d supply, fill #0

## 2024-10-21 ENCOUNTER — Other Ambulatory Visit (HOSPITAL_COMMUNITY): Payer: Self-pay

## 2024-10-21 ENCOUNTER — Other Ambulatory Visit: Payer: Self-pay

## 2024-10-21 MED ORDER — AMLODIPINE BESYLATE 5 MG PO TABS
5.0000 mg | ORAL_TABLET | Freq: Every day | ORAL | 3 refills | Status: AC
  Filled 2024-10-21: qty 90, 90d supply, fill #0
  Filled 2024-10-29 – 2024-11-10 (×2): qty 90, 90d supply, fill #1

## 2024-10-28 ENCOUNTER — Other Ambulatory Visit (HOSPITAL_COMMUNITY): Payer: Self-pay

## 2024-10-29 ENCOUNTER — Other Ambulatory Visit (HOSPITAL_COMMUNITY): Payer: Self-pay

## 2024-10-29 ENCOUNTER — Other Ambulatory Visit: Payer: Self-pay

## 2024-11-10 ENCOUNTER — Other Ambulatory Visit: Payer: Self-pay

## 2024-11-10 ENCOUNTER — Other Ambulatory Visit (HOSPITAL_COMMUNITY): Payer: Self-pay

## 2024-11-10 MED ORDER — METFORMIN HCL ER 500 MG PO TB24
2000.0000 mg | ORAL_TABLET | Freq: Every day | ORAL | 3 refills | Status: AC
  Filled 2024-11-10: qty 360, 90d supply, fill #0

## 2024-11-14 ENCOUNTER — Other Ambulatory Visit (HOSPITAL_COMMUNITY): Payer: Self-pay

## 2024-11-15 ENCOUNTER — Other Ambulatory Visit (HOSPITAL_COMMUNITY): Payer: Self-pay

## 2024-11-16 ENCOUNTER — Other Ambulatory Visit: Payer: Self-pay

## 2024-11-16 ENCOUNTER — Other Ambulatory Visit (HOSPITAL_COMMUNITY): Payer: Self-pay

## 2024-11-17 DIAGNOSIS — G4733 Obstructive sleep apnea (adult) (pediatric): Secondary | ICD-10-CM | POA: Diagnosis not present

## 2024-11-22 ENCOUNTER — Other Ambulatory Visit (HOSPITAL_COMMUNITY): Payer: Self-pay

## 2024-11-22 ENCOUNTER — Other Ambulatory Visit: Payer: Self-pay | Admitting: Family

## 2024-11-23 ENCOUNTER — Other Ambulatory Visit (HOSPITAL_COMMUNITY): Payer: Self-pay

## 2024-11-23 ENCOUNTER — Other Ambulatory Visit: Payer: Self-pay

## 2024-11-23 MED ORDER — OZEMPIC (2 MG/DOSE) 8 MG/3ML ~~LOC~~ SOPN
PEN_INJECTOR | SUBCUTANEOUS | 11 refills | Status: AC
  Filled 2024-11-23: qty 3, 28d supply, fill #0

## 2024-11-23 MED ORDER — DAPAGLIFLOZIN PROPANEDIOL 10 MG PO TABS
10.0000 mg | ORAL_TABLET | Freq: Every day | ORAL | 1 refills | Status: AC
  Filled 2024-11-23: qty 90, 90d supply, fill #0

## 2024-11-23 MED ORDER — CARVEDILOL 25 MG PO TABS
50.0000 mg | ORAL_TABLET | Freq: Two times a day (BID) | ORAL | 5 refills | Status: AC
  Filled 2024-11-23: qty 120, 30d supply, fill #0

## 2024-11-24 ENCOUNTER — Other Ambulatory Visit: Payer: Self-pay

## 2025-02-18 ENCOUNTER — Ambulatory Visit: Admitting: Family
